# Patient Record
Sex: Male | Born: 1965 | Race: Black or African American | Hispanic: No | Marital: Married | Smoking: Never smoker
Health system: Southern US, Community
[De-identification: ages and names within clinical notes are randomized; demographics above are authoritative.]

## PROBLEM LIST (undated history)

## (undated) DIAGNOSIS — M1990 Unspecified osteoarthritis, unspecified site: Secondary | ICD-10-CM

## (undated) DIAGNOSIS — E119 Type 2 diabetes mellitus without complications: Secondary | ICD-10-CM

## (undated) DIAGNOSIS — M109 Gout, unspecified: Secondary | ICD-10-CM

## (undated) DIAGNOSIS — F1910 Other psychoactive substance abuse, uncomplicated: Secondary | ICD-10-CM

## (undated) DIAGNOSIS — I10 Essential (primary) hypertension: Secondary | ICD-10-CM

## (undated) HISTORY — DX: Type 2 diabetes mellitus without complications: E11.9

## (undated) HISTORY — DX: Other psychoactive substance abuse, uncomplicated: F19.10

---

## 2002-11-01 ENCOUNTER — Emergency Department (HOSPITAL_COMMUNITY): Admission: EM | Admit: 2002-11-01 | Discharge: 2002-11-02 | Payer: Self-pay | Admitting: Emergency Medicine

## 2004-04-05 ENCOUNTER — Ambulatory Visit (HOSPITAL_COMMUNITY): Admission: RE | Admit: 2004-04-05 | Discharge: 2004-04-05 | Payer: Self-pay | Admitting: Family Medicine

## 2004-04-24 ENCOUNTER — Ambulatory Visit (HOSPITAL_COMMUNITY): Admission: RE | Admit: 2004-04-24 | Discharge: 2004-04-24 | Payer: Self-pay | Admitting: Family Medicine

## 2004-11-30 ENCOUNTER — Emergency Department (HOSPITAL_COMMUNITY): Admission: EM | Admit: 2004-11-30 | Discharge: 2004-11-30 | Payer: Self-pay | Admitting: *Deleted

## 2005-04-15 HISTORY — PX: TOTAL HIP ARTHROPLASTY: SHX124

## 2005-05-01 ENCOUNTER — Emergency Department (HOSPITAL_COMMUNITY): Admission: EM | Admit: 2005-05-01 | Discharge: 2005-05-01 | Payer: Self-pay | Admitting: Emergency Medicine

## 2005-05-08 ENCOUNTER — Ambulatory Visit: Payer: Self-pay | Admitting: Orthopedic Surgery

## 2005-05-22 ENCOUNTER — Ambulatory Visit: Payer: Self-pay | Admitting: Orthopedic Surgery

## 2005-07-25 ENCOUNTER — Inpatient Hospital Stay (HOSPITAL_COMMUNITY): Admission: RE | Admit: 2005-07-25 | Discharge: 2005-07-29 | Payer: Self-pay | Admitting: Internal Medicine

## 2007-07-31 ENCOUNTER — Emergency Department (HOSPITAL_COMMUNITY): Admission: EM | Admit: 2007-07-31 | Discharge: 2007-07-31 | Payer: Self-pay | Admitting: Emergency Medicine

## 2010-08-31 NOTE — Op Note (Signed)
NAMEORMAN, MATSUMURA              ACCOUNT NO.:  [id number]   MEDICAL RECORD NO.:  [id number]          PATIENT TYPE:  INP   LOCATION:  1606                         FACILITY:  [facility]   PHYSICIAN:  Almedia Balls. [name], M.D. DATE OF BIRTH:  11-Dec-1965   DATE OF PROCEDURE:  07/25/2005  DATE OF DISCHARGE:                                 OPERATIVE REPORT   PREOPERATIVE DIAGNOSIS:  End-stage left hip osteoarthritis.   POSTOPERATIVE DIAGNOSIS:  End-stage left hip osteoarthritis.   PROCEDURE PERFORMED:  Left hip total hip replacement using DePuy S/ROM  prosthesis with metal-on-metal bearing surface.   SURGEON:  Almedia Balls. [name], M.D.   ASSISTANT:  Madlyn Frankel. Charlann Boxer, M.D.   SECOND ASSISTANT:  Donnie Coffin. Dixon, PA-C.   ANESTHESIA:  General.   ESTIMATED BLOOD LOSS:  500 cc.   FLUIDS REPLACED:  Crystalloid 2300 cc.   URINE OUTPUT:  300 cc.   INSTRUMENT COUNT:  Correct.   COMPLICATIONS:  None.   ANTIBIOTICS:  Perioperative antibiotics given.   INDICATIONS:  Patient is a 45 year old male with a history of worsening left  hip pain and groin and leg pain.  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  The patient now complains of mechanical symptoms, giving way,  popping and locking of his hip as well as extreme pain, which is interfering  with his lifestyle.  The patient presents now for operative hip replacement.  Informed consent was obtained.   DESCRIPTION OF THE OPERATION:  After an adequate level of anesthesia was  achieved, the patient was positioned in the right lateral decubitus position  with the left hip up.  He was stabilized using a hip positioner.  The down  leg was padded appropriately, and the axillary roll was utilized.  The  patient was secured to the table.  The right and left hip was sterilely  prepped and draped in the usual manner after identifying it correctly as the  operative leg.  Once it  was prepped and draped, we went ahead and made a  posterior Kocher Lagenbach incision, starting at the vastus ridge and  extending posteriorly towards the gluteus fibers.  Dissection was carried  sharply down through the subcutaneous tissues.  The tensor fascia lata  incised in line with its skin incision, exposing the short external  rotators.  The gluteus medius retracted.  Short external rotators were taken  off the posterior aspect of the femur with progressive internal rotation.  The piriformis was tagged.  The posterior capsule was taken down, and then  the hip was dislocated.  At this point, went ahead and used a hip trial  ball, referencing off the greater trochanter and went ahead and made our  neck cut with an oscillating saw.  When I had removed excess bone and  capsule, at this point, we retracted the hip anterior to the hip socket,  removed the labrum, and had good visualization with appropriate retractor  placement for the hip socket.  The patient had a significantly sclerotic  hip; thus, we went  ahead and medialized initially with a 45 reamer and then  went ahead and reamed sequentially up to a size 55 reamer with good coverage  posteriorly and excellent posterior column bone.  At this point, we went  ahead and impacted a 56 Pinnacle resector cup, fully seated that.  We were  happy with the anteversion and the abduction, horizontal-laid abduction, and  checked that with Charnley hip position checking device.  At this point, we  went ahead and placed two dome screws into the socket, stabilizing it, and  then placed the trial 36 degree interdiameter 0 lip liner.  At this point,  went ahead and placed our attention back towards the femur.  On the femoral  side, went ahead and sequentially reamed up to a 13.5 mm diameter for the  femur.  We were careful to stay out as lateral as possible to keep out of  varus in terms of position of the implant.  Once we had done up to a 13.5,   we were happy with our diaphyseal reaming.  We went ahead and reamed the  proximal metaphyseal portion up to a size D and then went ahead and milled  out the neck up to the large size.  Thus, this was going to be an 18/13  implant, size D large for the sleeve.  Once we did that, we went ahead and  impacted the trial sleeve in place, and then the trial stem, 18/13, 36+8  stem with a 0 degree ball and took the hip through a full range of motion.  We noted excellent stability in all ranges of motion, and the hip seemed to  be just a touch tight; thus, we went ahead and reprepared the upper  metaphyseal portion with reaming again with the metaphyseal reamer and then  again with the neck reamer.  We went ahead and replaced the sleeve,  retrialed, and then were happy with leg lengths, which seemed to be  symmetric to what we had preoperatively on the table.  Went ahead and fully  ranged the knee.  We had excellent clearance, both in extension and external  rotation and flexion and internal rotation, great stability.  Well past 90  degrees and 45-50 degrees of internal rotation and no instability.  There  was really no appreciable shuck, 1-2 mm at the most.  At this point, we  removed our trial implants, thoroughly irrigated all of the wounds, removed  the trial liner, and then went ahead and placed in the real 36 metal-on-  metal liner and impacted it in place after placing a man hole cover over the  central hole.  At this point, we went ahead and placed our attention back  towards the femoral side and then impacted the 18T large sleeve into place,  and then placed the 18/13 36+8 stem in just a touch more anteversion than we  had with the sleeve, giving [place] about 20 degrees of femoral anteversion.  At  this point, we went ahead and placed a 0 trial head ball back on and reduced the hip and noted excellent stability, excellent leg length, range of  motion, and soft tissue tension.  Thus, we removed  the trial 0 head and then  placed a real metal-on-metal 36 head in place, rereduced the hip, took it  through a full range of motion and were happy with our results.  At this  point, we went ahead and thoroughly irrigated the wound.  We checked for  any  bleeding, none was noted.  Thus, we decided not to do a drain.  We sutured  the piriformis to the underside of the gluteus medius tendon, as it was  inserting in the greater trochanter, and then went ahead and repaired the  tensor fascia lata to itself with interrupted #1 Vicryl suture followed by 2-  0 Vicryl subcutaneous and 4-0 Monocryl for skin.  Steri-Strips were applied,  followed by a sterile dressing.  The patient was taken to the recovery room,  having tolerated the surgery well.           ______________________________  Almedia Balls. [name], M.D.     SRN/MEDQ  D:  07/25/2005  T:  07/25/2005  Job:  [id number]

## 2010-08-31 NOTE — Discharge Summary (Signed)
NAMEULISSES, VONDRAK              ACCOUNT NO.:  [id number]   MEDICAL RECORD NO.:  [id number]          PATIENT TYPE:  INP   LOCATION:  1606                         FACILITY:  [facility]   PHYSICIAN:  Maisie Fus B. Dixon, P.A.  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  The patient has elected  to have a left total hip arthroplasty by Dr. Malon Kindle.   PROCEDURES:  The patient had a left total hip arthroplasty by Dr. Malon Kindle using the [facility] system.  Attending surgeon was Malon Kindle, M.D.,  first assistant was Durene Romans, M.D.  Second assistant was [employer],  PA-C.  Estimated blood loss was 500 cc.  Fluid replacement was 2300 cc and  urine output was 30 cc.  Perioperative antibiotics were given and no  complications during surgery.   HOSPITAL COURSE:  The patient was admitted on July 25, 2005, for the above  stated procedure which he tolerated well without any difficulties, and after  adequate time in the post anesthesia care unit, he was transferred up to [address]  [address].  Postop day #1, the patient complained about some moderate soreness to  the left hip but was neurovascularly intact.  Initial H&H was 14 and 33.  On  postop day #1, it only dropped down to 11 and 33 with his electrolytes  normal.  Dressing was changed.  Neurovascularly, he was intact distally.  He  proceeded with physical therapy and occupational therapy.  Continued over  the next several days with physical therapy, although, the patient was  somewhat reluctant to work with therapy.  They did continue to work with him  and recommended home bound therapy and Home Health nursing which we agree  with.  The patient to be discharged home on July 29, 2005.   DISCHARGE PLAN:  To be discharged  home with Home Health nursing and physical  therapy on July 29, 2005.   CONDITION ON DISCHARGE:  Stable.   DISCHARGE INSTRUCTIONS:  1.  Diet:  Regular.  2.  Activity:  The patient is to be toe touch weight bearing with a walker      of the left hip.   DISCHARGE MEDICATIONS:  1.  Coumadin per pharmacy protocol.  2.  Percocet 5/325 one tablet q.4-6 h p.r.n. pain.  3.  Robaxin 500 mg one tablet q.6 h p.r.n. spasm.   FOLLOWUP:  The patient will follow up backup with Dr. Malon Kindle in one  week.      Thomas B. Ferne Coe.     TBD/MEDQ  D:  07/29/2005  T:  07/29/2005  Job:  [id number]

## 2010-08-31 NOTE — H&P (Signed)
Xavier Daniels, BLITCH              ACCOUNT NO.:  [id number]   MEDICAL RECORD NO.:  [id number]           PATIENT TYPE:   LOCATION:                                 FACILITY:   PHYSICIAN:  Almedia Balls. [name], M.D. DATE OF BIRTH:  June 13, 1965   DATE OF ADMISSION:  DATE OF DISCHARGE:                                HISTORY & PHYSICAL   CHIEF COMPLAINT:  Left hip pain.   HISTORY OF PRESENT ILLNESS:  The patient is a 45 year old male who has been  complaining about left hip pain ever since a fall on May 01, 2005.  The  patient states that he sustained a fall and started having some increasing  left hip pain.  The patient was diagnosed with avascular necrosis to the  left hip and has elected to have a left total hip arthroplasty by Dr. Malon Kindle on July 25, 2005.   ALLERGIES:  No known drug allergies.   MEDICATIONS:  Consist of:  1.  Vicodin 7.5 mg one tablet p.o. q.4-6h. p.r.n. pain.  2.  Ultram 50 mg one tablet t.i.d. p.r.n. pain.   PAST MEDICAL HISTORY:  Negative.   FAMILY HISTORY:  Diabetes.   SOCIAL HISTORY:  The patient is single, does not smoke, use alcohol. He does  live in a one floor house.   REVIEW OF SYSTEMS:  Negative except for painful ambulation.   PHYSICAL EXAMINATION:  VITAL SIGNS:  Pulse 20, respirations 18, blood  pressure 132/88.  GENERAL APPEARANCE:  Healthy appearing 45 year old male in no acute  distress.  Pleasant in mood and affect, alert and oriented x3.  HEENT:  Examination of head and neck shows cranial nerves II-XII grossly  intact.  NECK:  Shows full range of motion without any tenderness.  CHEST:  Shows active breath sounds bilaterally with no wheezes, rhonchi or  rales.  HEART:  Has regular rate and rhythm with no murmurs.  ABDOMEN: Nontender, nondistended.  EXTREMITIES:  Show moderate tenderness to the left hip with any type of  internal or external rotation.  He is actually somewhat longer on the left  as compared to the right with  measurement.  SKIN:  Shows no skin rashes.   CLINICAL DATA:  X-rays show avascular necrosis of the left hip.   IMPRESSION:  Left hip avascular necrosis.   PLAN:  Plan is to have a left total hip arthroplasty by Dr. Malon Kindle on  July 25, 2005.      Thomas B. Dixon, P.A.    ______________________________  Almedia Balls. [name], M.D.    TBD/MEDQ  D:  07/10/2005  T:  07/11/2005  Job:  [id number]

## 2012-03-24 ENCOUNTER — Emergency Department (HOSPITAL_COMMUNITY)
Admission: EM | Admit: 2012-03-24 | Discharge: 2012-03-24 | Disposition: A | Discharge status: Home / Self Care | Payer: BC Managed Care – PPO | Attending: Emergency Medicine | Admitting: Emergency Medicine

## 2012-03-24 ENCOUNTER — Emergency Department (HOSPITAL_COMMUNITY): Payer: BC Managed Care – PPO

## 2012-03-24 ENCOUNTER — Encounter (HOSPITAL_COMMUNITY): Payer: Self-pay

## 2012-03-24 DIAGNOSIS — M109 Gout, unspecified: Secondary | ICD-10-CM

## 2012-03-24 DIAGNOSIS — Z79899 Other long term (current) drug therapy: Secondary | ICD-10-CM | POA: Insufficient documentation

## 2012-03-24 DIAGNOSIS — I10 Essential (primary) hypertension: Secondary | ICD-10-CM | POA: Insufficient documentation

## 2012-03-24 HISTORY — DX: Essential (primary) hypertension: I10

## 2012-03-24 LAB — CBC WITH DIFFERENTIAL/PLATELET
Basophils Absolute: 0 10*3/uL (ref 0.0–0.1)
Basophils Relative: 0 % (ref 0–1)
Eosinophils Relative: 4 % (ref 0–5)
HCT: 43 % (ref 39.0–52.0)
Hemoglobin: 14.6 g/dL (ref 13.0–17.0)
Lymphocytes Relative: 27 % (ref 12–46)
Lymphs Abs: 1.6 10*3/uL (ref 0.7–4.0)
MCH: 29.1 pg (ref 26.0–34.0)
Monocytes Absolute: 0.4 10*3/uL (ref 0.1–1.0)
Monocytes Relative: 6 % (ref 3–12)
Neutro Abs: 3.9 10*3/uL (ref 1.7–7.7)
Neutrophils Relative %: 63 % (ref 43–77)
RBC: 5.01 MIL/uL (ref 4.22–5.81)
RDW: 13.8 % (ref 11.5–15.5)
WBC: 6.1 10*3/uL (ref 4.0–10.5)

## 2012-03-24 MED ORDER — PREDNISONE 10 MG PO TABS: ORAL_TABLET | ORAL | Status: DC

## 2012-03-24 MED ORDER — OXYCODONE-ACETAMINOPHEN 5-325 MG PO TABS: 1.0000 | ORAL_TABLET | ORAL | Status: AC | PRN

## 2012-03-24 MED ORDER — PREDNISONE 50 MG PO TABS
60.0000 mg | ORAL_TABLET | Freq: Once | ORAL | Status: AC
  Administered 2012-03-24: 60 mg via ORAL
  Filled 2012-03-24: qty 1

## 2012-03-24 NOTE — ED Notes (Signed)
Pt reports right wrist pain x2 weeks. Denies nay known injury

## 2012-03-26 NOTE — ED Provider Notes (Signed)
History     CSN: [id number]  Arrival date & time 03/24/12  6578   First MD Initiated Contact with Patient 03/24/12 0914      Chief Complaint  Patient presents with  . Wrist Pain    (Consider location/radiation/quality/duration/timing/severity/associated sxs/prior treatment) HPI Comments: RHYAN RADLER presents with pain, swelling and redness at his right dorsal wrist which has been present for approximately the past 2 weeks.  He describes occasional episodes of this, but lasting a few days only then resolving; the last episode occurred approximately 8 months ago.  He denies any injury to the wrist or hand, but has an old scaphoid fracture in this hand occuring 5 years ago which he never followed through with recommended surgery.  He denies numbness in his hand.  He is right handed.  The history is provided by the patient.    Past Medical History  Diagnosis Date  . Hypertension     Past Surgical History  Procedure Date  . Total hip arthroplasty     No family history on file.  History  Substance Use Topics  . Smoking status: Never Smoker   . Smokeless tobacco: Not on file  . Alcohol Use: No      Review of Systems  Constitutional: Negative for fever and chills.  HENT: Negative for congestion, sore throat and neck pain.   Eyes: Negative.   Respiratory: Negative for chest tightness and shortness of breath.   Cardiovascular: Negative for chest pain.  Gastrointestinal: Negative for abdominal pain.  Genitourinary: Negative.   Musculoskeletal: Positive for arthralgias. Negative for joint swelling.  Skin: Positive for color change. Negative for rash and wound.  Neurological: Negative for dizziness, weakness, light-headedness, numbness and headaches.  Hematological: Negative.   Psychiatric/Behavioral: Negative.     Allergies  Review of patient's allergies indicates no known allergies.  Home Medications   Current Outpatient Rx  Name  Route  Sig  Dispense  Refill   . AMLODIPINE BESYLATE 10 MG PO TABS   Oral   Take 10 mg by mouth daily.         . ATENOLOL 50 MG PO TABS   Oral   Take 50 mg by mouth daily.         . OXYCODONE-ACETAMINOPHEN 5-325 MG PO TABS   Oral   Take 1 tablet by mouth every 4 (four) hours as needed for pain.   20 tablet   0   . PREDNISONE 10 MG PO TABS      6, 5, 4, 3, 2 then 1 tablet by mouth daily for 6 days total.   21 tablet   0     BP 127/74  Pulse 76  Temp 98.1 F (36.7 C) (Oral)  Resp 20  Ht 6\' 1"  (1.854 m)  Wt 254 lb (115.214 kg)  BMI 33.51 kg/m2  SpO2 100%  Physical Exam  Constitutional: He appears well-developed and well-nourished.  HENT:  Head: Atraumatic.  Neck: Normal range of motion.  Cardiovascular:       Pulses equal bilaterally  Musculoskeletal: He exhibits edema and tenderness.       TTP,  Edema and erythema over right wrist dorsum.  No deformity of hand or fingers appreciated,  Less than 3 sec cap refill and radial pulse full.  Skin is intact with no lesions.  No red streaking.  Neurological: He is alert. He has normal strength. He displays normal reflexes. No sensory deficit.       Equal strength  Skin: Skin is warm and dry.  Psychiatric: He has a normal mood and affect.    ED Course  Procedures (including critical care time)  Labs Reviewed  URIC ACID - Abnormal; Notable for the following:    Uric Acid, Serum 11.7 (*)     All other components within normal limits  CBC WITH DIFFERENTIAL  LAB REPORT - SCANNED   No results found.   1. Gout attack       MDM  xrays and labs reviewed.  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  Pt was prescribed prednisone taper,  Oxycodone.  Encouraged warm compresses,  Elevation,  Referral to ortho for further management of both his old injury and his new gout diagnosis. Pt understands plan.        Burgess Amor, [place] 03/26/12 1819

## 2012-03-27 NOTE — ED Provider Notes (Signed)
Medical screening examination/treatment/procedure(s) were performed by non-physician practitioner and as supervising physician I was immediately available for consultation/collaboration.  Jones Skene, M.D.     Jones Skene, MD 03/27/12 2100

## 2012-04-02 ENCOUNTER — Encounter (HOSPITAL_COMMUNITY): Payer: Self-pay

## 2012-04-02 ENCOUNTER — Emergency Department (HOSPITAL_COMMUNITY)
Admission: EM | Admit: 2012-04-02 | Discharge: 2012-04-02 | Disposition: A | Discharge status: Home / Self Care | Payer: BC Managed Care – PPO | Attending: Emergency Medicine | Admitting: Emergency Medicine

## 2012-04-02 DIAGNOSIS — I10 Essential (primary) hypertension: Secondary | ICD-10-CM | POA: Insufficient documentation

## 2012-04-02 DIAGNOSIS — M109 Gout, unspecified: Secondary | ICD-10-CM | POA: Insufficient documentation

## 2012-04-02 DIAGNOSIS — Z79899 Other long term (current) drug therapy: Secondary | ICD-10-CM | POA: Insufficient documentation

## 2012-04-02 DIAGNOSIS — M25439 Effusion, unspecified wrist: Secondary | ICD-10-CM | POA: Insufficient documentation

## 2012-04-02 DIAGNOSIS — Z96649 Presence of unspecified artificial hip joint: Secondary | ICD-10-CM | POA: Insufficient documentation

## 2012-04-02 LAB — BASIC METABOLIC PANEL
BUN: 15 mg/dL (ref 6–23)
CO2: 27 mEq/L (ref 19–32)
Calcium: 10.3 mg/dL (ref 8.4–10.5)
Chloride: 97 mEq/L (ref 96–112)
Creatinine, Ser: 1.11 mg/dL (ref 0.50–1.35)
GFR calc Af Amer: >90 mL/min (ref >90)
GFR calc non Af Amer: 78 mL/min — ABNORMAL LOW (ref >90)
Glucose, Bld: 160 mg/dL — ABNORMAL HIGH (ref 70–99)
Potassium: 3.2 mEq/L — ABNORMAL LOW (ref 3.5–5.1)
Sodium: 134 mEq/L — ABNORMAL LOW (ref 135–145)

## 2012-04-02 MED ORDER — OXYCODONE-ACETAMINOPHEN 5-325 MG PO TABS: 1.0000 | ORAL_TABLET | ORAL | Status: AC | PRN

## 2012-04-02 MED ORDER — COLCHICINE 0.6 MG PO TABS: ORAL_TABLET | ORAL | Status: DC

## 2012-04-02 MED ORDER — COLCHICINE 0.6 MG PO TABS
1.2000 mg | ORAL_TABLET | Freq: Once | ORAL | Status: AC
  Administered 2012-04-02: 1.2 mg via ORAL
  Filled 2012-04-02: qty 2

## 2012-04-02 NOTE — ED Notes (Signed)
Right wrist gout pain that started last night, has been to er for same and unable to see pmd.

## 2012-04-04 NOTE — ED Provider Notes (Signed)
History     CSN: [id number]  Arrival date & time 04/02/12  7829   First MD Initiated Contact with Patient 04/02/12 0901      Chief Complaint  Patient presents with  . Gout    (Consider location/radiation/quality/duration/timing/severity/associated sxs/prior treatment) HPI Comments: Patient with hx of gout, c/o recurrent pain to his right wrist for 1-2 days.  States he was seen here last week and treated for same.  States the wrist pain resolved while taking the prednisone previously prescribed, but pain returned after steroid was finished.  He denies numbness, weakness or proximal tenderness.  Pain to the wrist is worse with movement and improves with rest.    Patient is a 46 y.o. male presenting with wrist pain. The history is provided by the patient.  Wrist Pain This is a recurrent problem. The current episode started yesterday. The problem occurs constantly. The problem has been gradually worsening. Associated symptoms include arthralgias and joint swelling. Pertinent negatives include no chills, congestion, coughing, diaphoresis, fever, headaches, myalgias, nausea, neck pain, numbness, rash, swollen glands, vomiting or weakness. The symptoms are aggravated by bending. He has tried oral narcotics (prednisone) for the symptoms. The treatment provided significant relief.    Past Medical History  Diagnosis Date  . Hypertension     Past Surgical History  Procedure Date  . Total hip arthroplasty     No family history on file.  History  Substance Use Topics  . Smoking status: Never Smoker   . Smokeless tobacco: Not on file  . Alcohol Use: No      Review of Systems  Constitutional: Negative for fever, chills and diaphoresis.  HENT: Negative for congestion and neck pain.   Respiratory: Negative for cough.   Gastrointestinal: Negative for nausea and vomiting.  Genitourinary: Negative for dysuria and difficulty urinating.  Musculoskeletal: Positive for joint swelling and  arthralgias. Negative for myalgias and back pain.  Skin: Negative for color change, rash and wound.  Neurological: Negative for weakness, numbness and headaches.  All other systems reviewed and are negative.    Allergies  Review of patient's allergies indicates no known allergies.  Home Medications   Current Outpatient Rx  Name  Route  Sig  Dispense  Refill  . AMLODIPINE BESYLATE 10 MG PO TABS   Oral   Take 10 mg by mouth daily.         . ATENOLOL 50 MG PO TABS   Oral   Take 50 mg by mouth daily.         . COLCHICINE 0.6 MG PO TABS      One tablet po q 2 hrs until pain improved or stomach upset.   20 tablet   0   . OXYCODONE-ACETAMINOPHEN 5-325 MG PO TABS   Oral   Take 1 tablet by mouth every 4 (four) hours as needed for pain.   24 tablet   0   . PREDNISONE 10 MG PO TABS      6, 5, 4, 3, 2 then 1 tablet by mouth daily for 6 days total.   21 tablet   0     BP 127/85  Pulse 70  Temp 97.8 F (36.6 C) (Oral)  Resp 18  SpO2 100%  Physical Exam  Nursing note and vitals reviewed. Constitutional: He is oriented to person, place, and time. He appears well-developed and well-nourished. No distress.  HENT:  Head: Normocephalic and atraumatic.  Cardiovascular: Normal rate, regular rhythm, normal heart sounds and intact distal  pulses.   No murmur heard. Pulmonary/Chest: Effort normal and breath sounds normal.  Musculoskeletal: He exhibits edema and tenderness.       Dorsal right wrist is ttp .  Radial pulse is brisk, sensation intact.  CR< 2 sec.  No bruising or deformity.  Patient has full ROM.  Neurological: He is alert and oriented to person, place, and time. He exhibits normal muscle tone. Coordination normal.  Skin: Skin is warm and dry.    ED Course  Procedures (including critical care time)  Labs Reviewed  BASIC METABOLIC PANEL - Abnormal; Notable for the following:    Sodium 134 (*)     Potassium 3.2 (*)     Glucose, Bld 160 (*)     GFR calc non  Af Amer 78 (*)     All other components within normal limits  LAB REPORT - SCANNED     1. Gout flare       MDM   Previous ed chart reviewed.  ttp of the dorsal right wrist.  No erythema, previously elevated uric acid level.  Will treat with percocet and colchcine.  Pt agrees to f/u with his PMD      Ferguson Gertner L. [place], [place] 04/04/12 0021

## 2012-04-07 NOTE — ED Provider Notes (Signed)
Medical screening examination/treatment/procedure(s) were performed by non-physician practitioner and as supervising physician I was immediately available for consultation/collaboration.  Prem Coykendall, MD 04/07/12 1838 

## 2012-07-13 ENCOUNTER — Other Ambulatory Visit: Payer: Self-pay | Admitting: Orthopedic Surgery

## 2012-07-14 ENCOUNTER — Encounter (HOSPITAL_BASED_OUTPATIENT_CLINIC_OR_DEPARTMENT_OTHER): Payer: Self-pay | Admitting: *Deleted

## 2012-07-14 NOTE — Progress Notes (Signed)
Called [facility] to see if they did ekg for gout attack-no No cardiac

## 2012-07-14 NOTE — Progress Notes (Signed)
Needs istat and ekg

## 2012-07-15 ENCOUNTER — Encounter (HOSPITAL_BASED_OUTPATIENT_CLINIC_OR_DEPARTMENT_OTHER)
Admission: RE | Disposition: A | Discharge status: Home / Self Care | Payer: Self-pay | Source: Ambulatory Visit | Attending: Orthopedic Surgery

## 2012-07-15 ENCOUNTER — Ambulatory Visit (HOSPITAL_BASED_OUTPATIENT_CLINIC_OR_DEPARTMENT_OTHER): Payer: BC Managed Care – PPO | Admitting: Certified Registered Nurse Anesthetist

## 2012-07-15 ENCOUNTER — Ambulatory Visit (HOSPITAL_BASED_OUTPATIENT_CLINIC_OR_DEPARTMENT_OTHER)
Admission: RE | Admit: 2012-07-15 | Discharge: 2012-07-15 | Disposition: A | Discharge status: Home / Self Care | Payer: BC Managed Care – PPO | Source: Ambulatory Visit | Attending: Orthopedic Surgery | Admitting: Orthopedic Surgery

## 2012-07-15 ENCOUNTER — Encounter (HOSPITAL_BASED_OUTPATIENT_CLINIC_OR_DEPARTMENT_OTHER): Payer: Self-pay | Admitting: Certified Registered Nurse Anesthetist

## 2012-07-15 ENCOUNTER — Encounter (HOSPITAL_BASED_OUTPATIENT_CLINIC_OR_DEPARTMENT_OTHER): Payer: Self-pay | Admitting: *Deleted

## 2012-07-15 DIAGNOSIS — Z538 Procedure and treatment not carried out for other reasons: Secondary | ICD-10-CM | POA: Insufficient documentation

## 2012-07-15 DIAGNOSIS — I10 Essential (primary) hypertension: Secondary | ICD-10-CM | POA: Insufficient documentation

## 2012-07-15 DIAGNOSIS — IMO0002 Reserved for concepts with insufficient information to code with codable children: Secondary | ICD-10-CM | POA: Insufficient documentation

## 2012-07-15 HISTORY — DX: Gout, unspecified: M10.9

## 2012-07-15 LAB — POCT I-STAT, CHEM 8
BUN: 8 mg/dL (ref 6–23)
Calcium, Ion: 1.15 mmol/L (ref 1.12–1.23)
Chloride: 103 mEq/L (ref 96–112)
Creatinine, Ser: 1.1 mg/dL (ref 0.50–1.35)
Glucose, Bld: 133 mg/dL — ABNORMAL HIGH (ref 70–99)
HCT: 47 % (ref 39.0–52.0)
Hemoglobin: 16 g/dL (ref 13.0–17.0)
Potassium: 2.7 mEq/L — CL (ref 3.5–5.1)
Sodium: 139 mEq/L (ref 135–145)
TCO2: 25 mmol/L (ref 0–100)

## 2012-07-15 SURGERY — CANCELLED PROCEDURE
Anesthesia: General | Site: Wrist | Laterality: Right

## 2012-07-15 MED ORDER — LACTATED RINGERS IV SOLN
INTRAVENOUS | Status: DC
  Administered 2012-07-15: 14:00:00 via INTRAVENOUS

## 2012-07-15 MED ORDER — MIDAZOLAM HCL 2 MG/2ML IJ SOLN: 1.0000 mg | INTRAMUSCULAR | Status: DC | PRN

## 2012-07-15 MED ORDER — CEFAZOLIN SODIUM-DEXTROSE 2-3 GM-% IV SOLR: 2.0000 g | INTRAVENOUS | Status: DC

## 2012-07-15 MED ORDER — ACETAMINOPHEN 10 MG/ML IV SOLN
1000.0000 mg | Freq: Once | INTRAVENOUS | Status: AC
  Administered 2012-07-15: 1000 mg via INTRAVENOUS

## 2012-07-15 MED ORDER — FENTANYL CITRATE 0.05 MG/ML IJ SOLN: 50.0000 ug | INTRAMUSCULAR | Status: DC | PRN

## 2012-07-15 MED ORDER — CHLORHEXIDINE GLUCONATE 4 % EX LIQD: 60.0000 mL | Freq: Once | CUTANEOUS | Status: DC

## 2012-07-15 SURGICAL SUPPLY — 58 items
APL SKNCLS STERI-STRIP NONHPOA (GAUZE/BANDAGES/DRESSINGS)
BAG DECANTER FOR FLEXI CONT (MISCELLANEOUS) IMPLANT
BANDAGE ELASTIC 3 VELCRO ST LF (GAUZE/BANDAGES/DRESSINGS) IMPLANT
BANDAGE ELASTIC 4 VELCRO ST LF (GAUZE/BANDAGES/DRESSINGS) IMPLANT
BANDAGE GAUZE ELAST BULKY 4 IN (GAUZE/BANDAGES/DRESSINGS) IMPLANT
BENZOIN TINCTURE PRP APPL 2/3 (GAUZE/BANDAGES/DRESSINGS) IMPLANT
BLADE MINI RND TIP GREEN BEAV (BLADE) IMPLANT
BLADE SURG 15 STRL LF DISP TIS (BLADE) IMPLANT
BLADE SURG 15 STRL SS (BLADE)
BNDG CMPR 9X4 STRL LF SNTH (GAUZE/BANDAGES/DRESSINGS)
BNDG ESMARK 4X9 LF (GAUZE/BANDAGES/DRESSINGS) IMPLANT
CANISTER SUCTION 1200CC (MISCELLANEOUS) IMPLANT
CLOTH BEACON ORANGE TIMEOUT ST (SAFETY) IMPLANT
CORDS BIPOLAR (ELECTRODE) IMPLANT
COVER TABLE BACK 60X90 (DRAPES) IMPLANT
CUFF TOURNIQUET SINGLE 18IN (TOURNIQUET CUFF) IMPLANT
DECANTER SPIKE VIAL GLASS SM (MISCELLANEOUS) IMPLANT
DRAPE EXTREMITY T 121X128X90 (DRAPE) IMPLANT
DRAPE OEC MINIVIEW 54X84 (DRAPES) IMPLANT
DRAPE SURG 17X23 STRL (DRAPES) IMPLANT
DURAPREP 26ML APPLICATOR (WOUND CARE) IMPLANT
GAUZE SPONGE 4X4 16PLY XRAY LF (GAUZE/BANDAGES/DRESSINGS) IMPLANT
GAUZE XEROFORM 1X8 LF (GAUZE/BANDAGES/DRESSINGS) IMPLANT
GLOVE BIO SURGEON STRL SZ8 (GLOVE) IMPLANT
GLOVE BIOGEL PI IND STRL 8.5 (GLOVE) IMPLANT
GLOVE BIOGEL PI INDICATOR 8.5 (GLOVE)
GLOVE SURG ORTHO 8.0 STRL STRW (GLOVE) IMPLANT
GOWN PREVENTION PLUS XLARGE (GOWN DISPOSABLE) IMPLANT
GOWN PREVENTION PLUS XXLARGE (GOWN DISPOSABLE) IMPLANT
NEEDLE HYPO 25X1 1.5 SAFETY (NEEDLE) IMPLANT
NS IRRIG 1000ML POUR BTL (IV SOLUTION) IMPLANT
PACK BASIN DAY SURGERY FS (CUSTOM PROCEDURE TRAY) IMPLANT
PAD CAST 3X4 CTTN HI CHSV (CAST SUPPLIES) IMPLANT
PAD CAST 4YDX4 CTTN HI CHSV (CAST SUPPLIES) IMPLANT
PADDING CAST ABS 4INX4YD NS (CAST SUPPLIES)
PADDING CAST ABS COTTON 4X4 ST (CAST SUPPLIES) IMPLANT
PADDING CAST COTTON 3X4 STRL (CAST SUPPLIES)
PADDING CAST COTTON 4X4 STRL (CAST SUPPLIES)
SHEET MEDIUM DRAPE 40X70 STRL (DRAPES) IMPLANT
SPLINT PLASTER CAST XFAST 3X15 (CAST SUPPLIES) IMPLANT
SPLINT PLASTER CAST XFAST 4X15 (CAST SUPPLIES) IMPLANT
SPLINT PLASTER XTRA FAST SET 4 (CAST SUPPLIES)
SPLINT PLASTER XTRA FASTSET 3X (CAST SUPPLIES)
SPONGE GAUZE 4X4 12PLY (GAUZE/BANDAGES/DRESSINGS) IMPLANT
STOCKINETTE 4X48 STRL (DRAPES) IMPLANT
STRIP CLOSURE SKIN 1/2X4 (GAUZE/BANDAGES/DRESSINGS) IMPLANT
SUCTION FRAZIER TIP 10 FR DISP (SUCTIONS) IMPLANT
SUT ETHILON 4 0 PS 2 18 (SUTURE) IMPLANT
SUT MERSILENE 4 0 P 3 (SUTURE) IMPLANT
SUT PROLENE 3 0 PS 2 (SUTURE) IMPLANT
SUT SILK 2 0 FS (SUTURE) IMPLANT
SUT VIC AB 3-0 FS2 27 (SUTURE) IMPLANT
SUT VICRYL RAPIDE 4/0 PS 2 (SUTURE) IMPLANT
SYR BULB 3OZ (MISCELLANEOUS) IMPLANT
SYRINGE 10CC LL (SYRINGE) IMPLANT
TOWEL OR 17X24 6PK STRL BLUE (TOWEL DISPOSABLE) IMPLANT
TUBE CONNECTING 20X1/4 (TUBING) IMPLANT
UNDERPAD 30X30 INCONTINENT (UNDERPADS AND DIAPERS) IMPLANT

## 2012-07-15 NOTE — Progress Notes (Signed)
Dr Sampson Goon aware of Istat results.

## 2012-07-15 NOTE — Progress Notes (Signed)
Case cancelled due to labwork. Patient to see medical MD at 930 tomorrow, and lab results faxed to MD office Xavier Daniels, [place] who works with Dr Almond Lint in [place])

## 2012-07-15 NOTE — Anesthesia Preprocedure Evaluation (Deleted)
Anesthesia Evaluation  Patient identified by MRN, date of birth, ID band Patient awake    Reviewed: Allergy & Precautions, H&P , NPO status , Patient's Chart, lab work & pertinent test results, reviewed documented beta blocker date and time   Airway Mallampati: II TM Distance: >3 FB Neck ROM: full    Dental no notable dental hx. (+) Teeth Intact and Dental Advidsory Given   Pulmonary neg pulmonary ROS,  breath sounds clear to auscultation  Pulmonary exam normal       Cardiovascular hypertension, On Medications and On Home Beta Blockers Rhythm:regular Rate:Normal     Neuro/Psych negative neurological ROS  negative psych ROS   GI/Hepatic negative GI ROS, Neg liver ROS,   Endo/Other  negative endocrine ROS  Renal/GU negative Renal ROS  negative genitourinary   Musculoskeletal   Abdominal   Peds  Hematology negative hematology ROS (+)   Anesthesia Other Findings   Reproductive/Obstetrics negative OB ROS                           Anesthesia Physical Anesthesia Plan  ASA: II  Anesthesia Plan: General   Post-op Pain Management:    Induction: Intravenous  Airway Management Planned: LMA  Additional Equipment:   Intra-op Plan:   Post-operative Plan: Extubation in OR  Informed Consent: I have reviewed the patients History and Physical, chart, labs and discussed the procedure including the risks, benefits and alternatives for the proposed anesthesia with the patient or authorized representative who has indicated his/her understanding and acceptance.   Dental advisory given  Plan Discussed with: CRNA and Surgeon  Anesthesia Plan Comments: (Pt declines nerve block.)       Anesthesia Quick Evaluation

## 2013-05-17 ENCOUNTER — Encounter (HOSPITAL_COMMUNITY): Payer: Self-pay | Admitting: Emergency Medicine

## 2013-05-17 ENCOUNTER — Emergency Department (HOSPITAL_COMMUNITY)
Admission: EM | Admit: 2013-05-17 | Discharge: 2013-05-17 | Disposition: A | Discharge status: Home / Self Care | Payer: BC Managed Care – PPO | Attending: Emergency Medicine | Admitting: Emergency Medicine

## 2013-05-17 DIAGNOSIS — Y92410 Unspecified street and highway as the place of occurrence of the external cause: Secondary | ICD-10-CM | POA: Insufficient documentation

## 2013-05-17 DIAGNOSIS — Z862 Personal history of diseases of the blood and blood-forming organs and certain disorders involving the immune mechanism: Secondary | ICD-10-CM | POA: Insufficient documentation

## 2013-05-17 DIAGNOSIS — Z79899 Other long term (current) drug therapy: Secondary | ICD-10-CM | POA: Insufficient documentation

## 2013-05-17 DIAGNOSIS — I10 Essential (primary) hypertension: Secondary | ICD-10-CM | POA: Insufficient documentation

## 2013-05-17 DIAGNOSIS — Y9389 Activity, other specified: Secondary | ICD-10-CM | POA: Insufficient documentation

## 2013-05-17 DIAGNOSIS — IMO0002 Reserved for concepts with insufficient information to code with codable children: Secondary | ICD-10-CM | POA: Insufficient documentation

## 2013-05-17 DIAGNOSIS — M549 Dorsalgia, unspecified: Secondary | ICD-10-CM

## 2013-05-17 DIAGNOSIS — Z8639 Personal history of other endocrine, nutritional and metabolic disease: Secondary | ICD-10-CM | POA: Insufficient documentation

## 2013-05-17 DIAGNOSIS — X500XXA Overexertion from strenuous movement or load, initial encounter: Secondary | ICD-10-CM | POA: Insufficient documentation

## 2013-05-17 DIAGNOSIS — T148XXA Other injury of unspecified body region, initial encounter: Secondary | ICD-10-CM

## 2013-05-17 MED ORDER — METHOCARBAMOL 500 MG PO TABS: 500.0000 mg | ORAL_TABLET | Freq: Three times a day (TID) | ORAL | Status: DC

## 2013-05-17 MED ORDER — IBUPROFEN 800 MG PO TABS: ORAL_TABLET | ORAL | Status: DC
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
## 2013-05-17 NOTE — ED Notes (Signed)
Exam by PA, alert, NAD
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
## 2013-05-19 NOTE — ED Provider Notes (Signed)
Medical screening examination/treatment/procedure(s) were performed by non-physician practitioner and as supervising physician I was immediately available for consultation/collaboration.  EKG Interpretation   None         Maudry Diego, MD 05/19/13 1527

## 2013-07-29 ENCOUNTER — Emergency Department (HOSPITAL_COMMUNITY)
Admission: EM | Admit: 2013-07-29 | Discharge: 2013-07-29 | Disposition: A | Discharge status: Home / Self Care | Payer: BC Managed Care – PPO | Attending: Emergency Medicine | Admitting: Emergency Medicine

## 2013-07-29 ENCOUNTER — Encounter (HOSPITAL_COMMUNITY): Payer: Self-pay | Admitting: Emergency Medicine

## 2013-07-29 DIAGNOSIS — Z79899 Other long term (current) drug therapy: Secondary | ICD-10-CM | POA: Insufficient documentation

## 2013-07-29 DIAGNOSIS — I10 Essential (primary) hypertension: Secondary | ICD-10-CM | POA: Insufficient documentation

## 2013-07-29 DIAGNOSIS — Z8639 Personal history of other endocrine, nutritional and metabolic disease: Secondary | ICD-10-CM | POA: Insufficient documentation

## 2013-07-29 DIAGNOSIS — M549 Dorsalgia, unspecified: Secondary | ICD-10-CM | POA: Insufficient documentation

## 2013-07-29 DIAGNOSIS — Z862 Personal history of diseases of the blood and blood-forming organs and certain disorders involving the immune mechanism: Secondary | ICD-10-CM | POA: Insufficient documentation

## 2013-07-29 MED ORDER — HYDROCODONE-ACETAMINOPHEN 5-325 MG PO TABS: 1.0000 | ORAL_TABLET | Freq: Four times a day (QID) | ORAL | Status: DC | PRN

## 2013-07-29 MED ORDER — DIAZEPAM 5 MG PO TABS: 5.0000 mg | ORAL_TABLET | Freq: Two times a day (BID) | ORAL | Status: AC

## 2013-07-29 MED ORDER — IBUPROFEN 800 MG PO TABS: ORAL_TABLET | ORAL | Status: AC

## 2013-07-29 NOTE — Discharge Instructions (Signed)
As discussed, your evaluation today has been largely reassuring.  But, it is important that you monitor your condition carefully, and do not hesitate to return to the ED if you develop new, or concerning changes in your condition.  Otherwise, please follow-up with your physician and your chiropractor for appropriate ongoing care.

## 2013-07-29 NOTE — ED Notes (Signed)
C/o left sided back pain that began several days ago. Worse with movement. Ambulated to room without difficulty.

## 2013-07-29 NOTE — ED Notes (Signed)
nad noted prior to dc. Dc instructions reviewed and explained. 3 script given. Ambulated out without difficulty.
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
## 2014-03-31 ENCOUNTER — Encounter (HOSPITAL_COMMUNITY): Payer: Self-pay | Admitting: Emergency Medicine

## 2014-03-31 ENCOUNTER — Emergency Department (HOSPITAL_COMMUNITY)
Admission: EM | Admit: 2014-03-31 | Discharge: 2014-03-31 | Disposition: A | Discharge status: Home / Self Care | Payer: BC Managed Care – PPO | Attending: Emergency Medicine | Admitting: Emergency Medicine

## 2014-03-31 ENCOUNTER — Emergency Department (HOSPITAL_COMMUNITY): Payer: Self-pay

## 2014-03-31 ENCOUNTER — Emergency Department (HOSPITAL_COMMUNITY): Payer: BC Managed Care – PPO

## 2014-03-31 DIAGNOSIS — M109 Gout, unspecified: Secondary | ICD-10-CM | POA: Insufficient documentation

## 2014-03-31 DIAGNOSIS — Z79899 Other long term (current) drug therapy: Secondary | ICD-10-CM | POA: Insufficient documentation

## 2014-03-31 DIAGNOSIS — R599 Enlarged lymph nodes, unspecified: Secondary | ICD-10-CM

## 2014-03-31 DIAGNOSIS — I10 Essential (primary) hypertension: Secondary | ICD-10-CM | POA: Insufficient documentation

## 2014-03-31 DIAGNOSIS — R079 Chest pain, unspecified: Secondary | ICD-10-CM

## 2014-03-31 DIAGNOSIS — R591 Generalized enlarged lymph nodes: Secondary | ICD-10-CM

## 2014-03-31 LAB — CBC WITH DIFFERENTIAL/PLATELET
BASOS ABS: 0 10*3/uL (ref 0.0–0.1)
BASOS PCT: 1 % (ref 0–1)
Eosinophils Absolute: 0.1 10*3/uL (ref 0.0–0.7)
Eosinophils Relative: 2 % (ref 0–5)
HCT: 41.2 % (ref 39.0–52.0)
HEMOGLOBIN: 14 g/dL (ref 13.0–17.0)
Lymphocytes Relative: 34 % (ref 12–46)
Lymphs Abs: 1.9 10*3/uL (ref 0.7–4.0)
MCH: 29.3 pg (ref 26.0–34.0)
MCHC: 34 g/dL (ref 30.0–36.0)
MCV: 86.2 fL (ref 78.0–100.0)
Monocytes Absolute: 0.5 10*3/uL (ref 0.1–1.0)
Monocytes Relative: 8 % (ref 3–12)
NEUTROS ABS: 3.2 10*3/uL (ref 1.7–7.7)
NEUTROS PCT: 55 % (ref 43–77)
Platelets: 257 10*3/uL (ref 150–400)
RBC: 4.78 MIL/uL (ref 4.22–5.81)
RDW: 13.9 % (ref 11.5–15.5)
WBC: 5.7 10*3/uL (ref 4.0–10.5)

## 2014-03-31 LAB — TROPONIN I

## 2014-03-31 LAB — BASIC METABOLIC PANEL
ANION GAP: 15 (ref 5–15)
BUN: 18 mg/dL (ref 6–23)
CALCIUM: 10.3 mg/dL (ref 8.4–10.5)
CHLORIDE: 97 meq/L (ref 96–112)
CO2: 26 mEq/L (ref 19–32)
Creatinine, Ser: 1.22 mg/dL (ref 0.50–1.35)
GFR calc Af Amer: 79 mL/min — ABNORMAL LOW (ref >90)
GFR calc non Af Amer: 69 mL/min — ABNORMAL LOW (ref >90)
Glucose, Bld: 102 mg/dL — ABNORMAL HIGH (ref 70–99)
Potassium: 3.4 mEq/L — ABNORMAL LOW (ref 3.7–5.3)
SODIUM: 138 meq/L (ref 137–147)

## 2014-03-31 MED ORDER — SODIUM CHLORIDE 0.9 % IV BOLUS (SEPSIS)
500.0000 mL | Freq: Once | INTRAVENOUS | Status: AC
  Administered 2014-03-31: 500 mL via INTRAVENOUS

## 2014-03-31 MED ORDER — SODIUM CHLORIDE 0.9 % IV SOLN: INTRAVENOUS | Status: DC

## 2014-03-31 MED ORDER — IOHEXOL 300 MG/ML  SOLN
80.0000 mL | Freq: Once | INTRAMUSCULAR | Status: AC | PRN
  Administered 2014-03-31: 80 mL via INTRAVENOUS

## 2014-03-31 MED ORDER — NAPROXEN 500 MG PO TABS: 500.0000 mg | ORAL_TABLET | Freq: Two times a day (BID) | ORAL | Status: DC

## 2014-03-31 NOTE — ED Provider Notes (Signed)
CSN: [id number]     Arrival date & time 03/31/14  1507 History   First MD Initiated Contact with Patient 03/31/14 1528     Chief Complaint  Patient presents with  . Chest Pain     (Consider location/radiation/quality/duration/timing/severity/associated sxs/prior Treatment) Patient is a 48 y.o. male presenting with chest pain. The history is provided by the patient.  Chest Pain Associated symptoms: no abdominal pain, no back pain, no fever, no headache, no nausea, no palpitations, no shortness of breath and not vomiting    patient is followed by the health department for high blood pressure. Patient with 2 day history of left-sided chest pain that is intermittent and brief lasting just seconds. Not associated with shortness of breath not associated with nausea or vomiting. Patient does admit to feeling funny but is not lightheaded does not feel faint does not feel like his had a pass out does not have any dizziness. Patient has been taking his blood pressure medicine. The chest pain is left-sided sharp in nature very brief nonradiating not made better or worse by anything. Again not associated with shortness of breath.  Past Medical History  Diagnosis Date  . Hypertension   . Gout    Past Surgical History  Procedure Laterality Date  . Total hip arthroplasty  2007   Family History  Problem Relation Age of Onset  . Diabetes Mother    History  Substance Use Topics  . Smoking status: Never Smoker   . Smokeless tobacco: Never Used  . Alcohol Use: No    Review of Systems  Constitutional: Negative for fever.  HENT: Negative for congestion.   Eyes: Negative for visual disturbance.  Respiratory: Negative for shortness of breath.   Cardiovascular: Positive for chest pain. Negative for palpitations and leg swelling.  Gastrointestinal: Negative for nausea, vomiting and abdominal pain.  Genitourinary: Negative for dysuria.  Musculoskeletal: Negative for back pain.  Skin: Negative for  rash.  Neurological: Negative for headaches.  Hematological: Does not bruise/bleed easily.  Psychiatric/Behavioral: Negative for confusion.      Allergies  Review of patient's allergies indicates no known allergies.  Home Medications   Prior to Admission medications   Medication Sig Start Date End Date Taking? Authorizing Provider  amLODipine (NORVASC) 10 MG tablet Take 10 mg by mouth daily.   Yes Historical Provider, MD  atenolol-chlorthalidone (TENORETIC) 50-25 MG per tablet Take 1 tablet by mouth daily.   Yes Historical Provider, MD  indomethacin (INDOCIN) 50 MG capsule Take 50 mg by mouth daily as needed for mild pain or moderate pain.    Yes Historical Provider, MD  Multiple Vitamin (MULTIVITAMIN WITH MINERALS) TABS tablet Take 1 tablet by mouth daily.   Yes Historical Provider, MD  Potassium (POTASSIMIN PO) Take 1 tablet by mouth daily as needed (FOR LOW POTASSIUM).   Yes Historical Provider, MD  CRESTOR 5 MG tablet Take 5 mg by mouth at bedtime. 03/23/14   Historical Provider, MD  HYDROcodone-acetaminophen (NORCO/VICODIN) 5-325 MG per tablet Take 1 tablet by mouth every 6 (six) hours as needed. Patient not taking: Reported on 03/31/2014 07/29/13   Carmin Muskrat, MD  naproxen (NAPROSYN) 500 MG tablet Take 1 tablet (500 mg total) by mouth 2 (two) times daily. 03/31/14   Fredia Sorrow, MD   BP 130/87 mmHg  Pulse 81  Temp(Src) 98.8 F (37.1 C) (Oral)  Resp 20  Ht 6' (1.829 m)  Wt 243 lb (110.224 kg)  BMI 32.95 kg/m2  SpO2 99% Physical Exam  Constitutional: He is oriented to person, place, and time. He appears well-developed and well-nourished. No distress.  HENT:  Head: Normocephalic and atraumatic.  Mouth/Throat: Oropharynx is clear and moist.  Eyes: Conjunctivae and EOM are normal. Pupils are equal, round, and reactive to light.  Neck: Normal range of motion. Neck supple.  Cardiovascular: Normal rate, regular rhythm and normal heart sounds.   Pulmonary/Chest: Effort  normal and breath sounds normal. No respiratory distress.  Abdominal: Soft. Bowel sounds are normal.  Musculoskeletal: Normal range of motion. He exhibits no edema or tenderness.  Neurological: He is alert and oriented to person, place, and time. No cranial nerve deficit. He exhibits normal muscle tone. Coordination normal.  Skin: Skin is warm.  Nursing note and vitals reviewed.   ED Course  Procedures (including critical care time) Labs Review Labs Reviewed  BASIC METABOLIC PANEL - Abnormal; Notable for the following:    Potassium 3.4 (*)    Glucose, Bld 102 (*)    GFR calc non Af Amer 69 (*)    GFR calc Af Amer 79 (*)    All other components within normal limits  TROPONIN I  CBC WITH DIFFERENTIAL   Results for orders placed or performed during the hospital encounter of 03/31/14  Troponin I  Result Value Ref Range   Troponin I <0.30 <0.30 ng/mL  Basic metabolic panel  Result Value Ref Range   Sodium 138 137 - 147 mEq/L   Potassium 3.4 (L) 3.7 - 5.3 mEq/L   Chloride 97 96 - 112 mEq/L   CO2 26 19 - 32 mEq/L   Glucose, Bld 102 (H) 70 - 99 mg/dL   BUN 18 6 - 23 mg/dL   Creatinine, Ser 1.22 0.50 - 1.35 mg/dL   Calcium 10.3 8.4 - 10.5 mg/dL   GFR calc non Af Amer 69 (L) >90 mL/min   GFR calc Af Amer 79 (L) >90 mL/min   Anion gap 15 5 - 15  CBC with Differential  Result Value Ref Range   WBC 5.7 4.0 - 10.5 K/uL   RBC 4.78 4.22 - 5.81 MIL/uL   Hemoglobin 14.0 13.0 - 17.0 g/dL   HCT 41.2 39.0 - 52.0 %   MCV 86.2 78.0 - 100.0 fL   MCH 29.3 26.0 - 34.0 pg   MCHC 34.0 30.0 - 36.0 g/dL   RDW 13.9 11.5 - 15.5 %   Platelets 257 150 - 400 K/uL   Neutrophils Relative % 55 43 - 77 %   Neutro Abs 3.2 1.7 - 7.7 K/uL   Lymphocytes Relative 34 12 - 46 %   Lymphs Abs 1.9 0.7 - 4.0 K/uL   Monocytes Relative 8 3 - 12 %   Monocytes Absolute 0.5 0.1 - 1.0 K/uL   Eosinophils Relative 2 0 - 5 %   Eosinophils Absolute 0.1 0.0 - 0.7 K/uL   Basophils Relative 1 0 - 1 %   Basophils Absolute  0.0 0.0 - 0.1 K/uL     Imaging Review Dg Chest 2 View  03/31/2014   CLINICAL DATA:  Two day history of left sided chest discomfort  EXAM: CHEST  2 VIEW  COMPARISON:  None.  FINDINGS: There is no edema or consolidation. The heart size and pulmonary vascularity are normal.  There is fullness in the right inferior hilar region. No other evidence of potential adenopathy. No bone lesions. No pneumothorax.  IMPRESSION: Fullness in the right inferior hilar region. Advise contrast enhanced chest CT to assess for possible adenopathy  in this area.  No lung edema or consolidation.  No pneumothorax.   Electronically Signed   By: Lowella Grip M.D.   On: 03/31/2014 16:46   Ct Chest W Contrast  03/31/2014   CLINICAL DATA:  Abnormal chest x-ray with right hilar fullness  EXAM: CT CHEST WITH CONTRAST  TECHNIQUE: Multidetector CT imaging of the chest was performed during intravenous contrast administration.  CONTRAST:  81mL OMNIPAQUE IOHEXOL 300 MG/ML  SOLN  COMPARISON:  03/31/2014  FINDINGS: The lungs are well aerated bilaterally without evidence of focal infiltrate or sizable effusion. No parenchymal nodules are seen. The thoracic inlet is within normal limits. The thoracic aorta and its branches are unremarkable. There are changes consistent with right paratracheal/hilar adenopathy which measures 2.7 x 1.4 cm in toto. A smaller node is noted measuring 12.5 mm in short axis. Additionally some smaller nodes are seen in the right infrahilar region. Similar nodal changes are noted on the left but to a slightly lesser degree. No significant mediastinal adenopathy is noted.  Scanning into the upper abdomen reveals no acute abnormality. The osseous structures are within normal limits.  IMPRESSION: No evidence of focal infiltrate. Hilar adenopathy likely related to sarcoidosis. Clinical correlation is recommended and further evaluation performed as necessary   Electronically Signed   By: Inez Catalina M.D.   On: 03/31/2014  18:42     EKG Interpretation   Date/Time:  Thursday March 31 2014 15:29:00 EST Ventricular Rate:  83 PR Interval:  157 QRS Duration: 113 QT Interval:  380 QTC Calculation: 446 R Axis:   11 Text Interpretation:  Sinus rhythm Borderline intraventricular conduction  delay RSR' in V1 or V2, probably normal variant No significant change  since last tracing Confirmed by Semir Brill  MD, Jaxston Chohan [phone]) on 03/31/2014  3:46:37 PM      MDM   Final diagnoses:  Chest pain  Adenopathy    Workup for the chest pain without any significant findings. Troponin was negative EKG without acute changes. Chest x-ray negative for pneumonia pneumothorax. Patient's had the chest pain for 2 days. Been intermittent never lasting more than a few minutes. Certainly not lasting 15 minutes. Usually lasting seconds. CT scan of the chest was done for the adenopathy. That seems to be consistent with sarcoidosis. Patient will follow-up with the health department as he is currently followed there for his high blood pressure. Patient nontoxic no acute distress. Clinically not worried about a pulmonary embolus.    Fredia Sorrow, MD 03/31/14 [phone]

## 2014-03-31 NOTE — ED Notes (Addendum)
Patient reports "just feeling funny last couple of days." Per patient feels fine when sitting but if he gets up to do anything he has a "funny feeling." Patient unable to describe. Patient does report intermittent left chest. Denies any shortness of breath, dizziness, nausea, or vomiting. Patient states "At first I thought it was blood pressure but every time I checked it it was fine. Then I thought it was my potassium, so I started taking potassium pills and eating bananas and raisins."

## 2014-03-31 NOTE — Discharge Instructions (Signed)
Workup for the chest pain without evidence of any acute cardiac event. Also no evidence of pneumonia. Does show some enlarged lymph nodes in your chest this could be consistent with sarcoidosis. Follow-up with the health department. Return for any new or worse symptoms. Take the Naprosyn as directed for the next 7 days.

## 2014-04-28 ENCOUNTER — Encounter (HOSPITAL_BASED_OUTPATIENT_CLINIC_OR_DEPARTMENT_OTHER): Payer: Self-pay | Admitting: Orthopedic Surgery

## 2014-05-19 ENCOUNTER — Encounter: Payer: Self-pay | Admitting: *Deleted

## 2014-05-20 ENCOUNTER — Emergency Department (HOSPITAL_COMMUNITY): Payer: Self-pay

## 2014-05-20 ENCOUNTER — Emergency Department (HOSPITAL_COMMUNITY)
Admission: EM | Admit: 2014-05-20 | Discharge: 2014-05-20 | Disposition: A | Discharge status: Home / Self Care | Payer: Self-pay | Attending: Emergency Medicine | Admitting: Emergency Medicine

## 2014-05-20 ENCOUNTER — Encounter (HOSPITAL_COMMUNITY): Payer: Self-pay | Admitting: Emergency Medicine

## 2014-05-20 DIAGNOSIS — M109 Gout, unspecified: Secondary | ICD-10-CM | POA: Insufficient documentation

## 2014-05-20 DIAGNOSIS — I10 Essential (primary) hypertension: Secondary | ICD-10-CM | POA: Insufficient documentation

## 2014-05-20 DIAGNOSIS — M25431 Effusion, right wrist: Secondary | ICD-10-CM | POA: Insufficient documentation

## 2014-05-20 DIAGNOSIS — R079 Chest pain, unspecified: Secondary | ICD-10-CM | POA: Insufficient documentation

## 2014-05-20 DIAGNOSIS — Z79899 Other long term (current) drug therapy: Secondary | ICD-10-CM | POA: Insufficient documentation

## 2014-05-20 DIAGNOSIS — M25531 Pain in right wrist: Secondary | ICD-10-CM | POA: Insufficient documentation

## 2014-05-20 LAB — CBC WITH DIFFERENTIAL/PLATELET
Basophils Absolute: 0 10*3/uL (ref 0.0–0.1)
Basophils Relative: 0 % (ref 0–1)
Eosinophils Absolute: 0 10*3/uL (ref 0.0–0.7)
Eosinophils Relative: 0 % (ref 0–5)
HCT: 42.2 % (ref 39.0–52.0)
Hemoglobin: 14.1 g/dL (ref 13.0–17.0)
Lymphocytes Relative: 19 % (ref 12–46)
Lymphs Abs: 1.7 10*3/uL (ref 0.7–4.0)
MCH: 28.7 pg (ref 26.0–34.0)
MCHC: 33.4 g/dL (ref 30.0–36.0)
MCV: 85.9 fL (ref 78.0–100.0)
Monocytes Absolute: 0.5 10*3/uL (ref 0.1–1.0)
Monocytes Relative: 5 % (ref 3–12)
Neutro Abs: 7 10*3/uL (ref 1.7–7.7)
Neutrophils Relative %: 76 % (ref 43–77)
Platelets: 313 10*3/uL (ref 150–400)
RBC: 4.91 MIL/uL (ref 4.22–5.81)
RDW: 13.3 % (ref 11.5–15.5)
WBC: 9.3 10*3/uL (ref 4.0–10.5)

## 2014-05-20 LAB — BASIC METABOLIC PANEL
Anion gap: 11 (ref 5–15)
BUN: 11 mg/dL (ref 6–23)
CO2: 26 mmol/L (ref 19–32)
Calcium: 10.2 mg/dL (ref 8.4–10.5)
Chloride: 103 mmol/L (ref 96–112)
Creatinine, Ser: 1.14 mg/dL (ref 0.50–1.35)
GFR calc Af Amer: 86 mL/min — ABNORMAL LOW (ref >90)
GFR calc non Af Amer: 74 mL/min — ABNORMAL LOW (ref >90)
Glucose, Bld: 109 mg/dL — ABNORMAL HIGH (ref 70–99)
Potassium: 3.6 mmol/L (ref 3.5–5.1)
Sodium: 140 mmol/L (ref 135–145)

## 2014-05-20 LAB — TROPONIN I: Troponin I: <0.03 ng/mL (ref <0.031)

## 2014-05-20 MED ORDER — IBUPROFEN 400 MG PO TABS
600.0000 mg | ORAL_TABLET | Freq: Once | ORAL | Status: AC
  Administered 2014-05-20: 600 mg via ORAL
  Filled 2014-05-20: qty 2

## 2014-05-20 MED ORDER — IBUPROFEN 600 MG PO TABS: 600.0000 mg | ORAL_TABLET | Freq: Four times a day (QID) | ORAL | Status: DC | PRN

## 2014-05-20 MED ORDER — COLCHICINE 0.6 MG PO TABS
1.2000 mg | ORAL_TABLET | Freq: Once | ORAL | Status: AC
  Administered 2014-05-20: 1.2 mg via ORAL
  Filled 2014-05-20: qty 2

## 2014-05-20 MED ORDER — COLCHICINE 0.6 MG PO TABS: 0.6000 mg | ORAL_TABLET | Freq: Two times a day (BID) | ORAL | Status: DC | PRN

## 2014-05-20 MED ORDER — OXYCODONE-ACETAMINOPHEN 5-325 MG PO TABS: 1.0000 | ORAL_TABLET | ORAL | Status: DC | PRN

## 2014-05-20 NOTE — ED Notes (Signed)
Pt alert & oriented x4, stable gait. Patient given discharge instructions, paperwork & prescription(s). Patient  instructed to stop at the registration desk to finish any additional paperwork. Patient verbalized understanding. Pt left department w/ no further questions. 
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
## 2014-05-20 NOTE — Discharge Instructions (Signed)

## 2014-06-06 ENCOUNTER — Ambulatory Visit (INDEPENDENT_AMBULATORY_CARE_PROVIDER_SITE_OTHER): Payer: 59 | Admitting: Family Medicine

## 2014-06-06 ENCOUNTER — Encounter: Payer: Self-pay | Admitting: Family Medicine

## 2014-06-06 VITALS — BP 132/76 | HR 76 | Temp 98.5°F | Resp 18 | Ht 73.0 in | Wt 238.0 lb

## 2014-06-06 DIAGNOSIS — M109 Gout, unspecified: Secondary | ICD-10-CM | POA: Insufficient documentation

## 2014-06-06 DIAGNOSIS — I10 Essential (primary) hypertension: Secondary | ICD-10-CM

## 2014-06-06 DIAGNOSIS — E785 Hyperlipidemia, unspecified: Secondary | ICD-10-CM

## 2014-06-06 DIAGNOSIS — E669 Obesity, unspecified: Secondary | ICD-10-CM

## 2014-06-06 DIAGNOSIS — M1A09X0 Idiopathic chronic gout, multiple sites, without tophus (tophi): Secondary | ICD-10-CM

## 2014-06-06 MED ORDER — IBUPROFEN 600 MG PO TABS: 600.0000 mg | ORAL_TABLET | Freq: Four times a day (QID) | ORAL | Status: DC | PRN

## 2014-06-06 MED ORDER — FEBUXOSTAT 40 MG PO TABS: 40.0000 mg | ORAL_TABLET | Freq: Every day | ORAL | Status: DC

## 2014-06-06 MED ORDER — ATENOLOL 50 MG PO TABS: 50.0000 mg | ORAL_TABLET | Freq: Every day | ORAL | Status: DC

## 2014-06-06 NOTE — Assessment & Plan Note (Signed)
He is not taking the Crestor that he was prescribed. I will check his lipid panel and see if he still needs anything for his cholesterol

## 2014-06-06 NOTE — Assessment & Plan Note (Signed)
Significant history of gout. Will recheck a uric acid. He also has a family history of gout. Since others in the family have done well with the Uloric will give him a sample of the 40 mg once a day

## 2014-06-06 NOTE — Assessment & Plan Note (Signed)
His goal is another 10 pounds off. This will get him out of the obesity category and help significantly decrease his risk for coronary artery disease

## 2014-06-06 NOTE — Assessment & Plan Note (Signed)
Blood pressure is well-controlled today however I'm concerned about him being on hydrochlorothiazide in the setting of his severe gout. I'm going to discontinue this I will check his blood work. He will follow-up in 2 weeks and we'll see how he is doing on just the atenolol and the amlodipine

## 2014-06-06 NOTE — Patient Instructions (Addendum)
Stop the HCTZ Continue atenolol and norvasc We will call with lab results  Try Uloric take 1 tablet daily  Prescription card given [place]- [facility] DEPT F/U in 2 weeks for blood pressure

## 2014-06-06 NOTE — Addendum Note (Signed)
Addended by: Sheral Flow on: 06/06/2014 02:57 PM   Modules accepted: Orders

## 2014-06-06 NOTE — Progress Notes (Signed)
Patient ID: Xavier Daniels, male   DOB: [birth year], 49 y.o.   MRN: [id number]   Subjective:    Patient ID: Xavier Daniels, male    DOB: 12-Jun-1965, 49 y.o.   MRN: [id number]  Patient presents for New Patient- Establish Care and Gout  patient here to establish care. His previous primary care providers the health department in [facility]. He states that he recently received insurance. He has history of hypertension which is being controlled on hydrochlorothiazide, amlodipine and atenolol. He also has hyperlipidemia however he has lost 40 pounds intentionally he was given Crestor but he never took this due to change in his diet. He also suffers with severe gout. His uric acid level was elevated at 11.7 in 2013. He has bottles of indomethacin as well as cultures seen which he keeps on hand for flares. He was also given prednisone and Percocet by the ER last year because of a severe flare in his wrists. He was never put on any maintenance medications per report. He asked about Urloric today he has family members that are on this that help control their gout.  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  Declines flu shot TDAP UTD  Review Of Systems:  GEN- denies fatigue, fever, weight loss,weakness, recent illness HEENT- denies eye drainage, change in vision, nasal discharge, CVS- denies chest pain, palpitations RESP- denies SOB, cough, wheeze ABD- denies N/V, change in stools, abd pain GU- denies dysuria, hematuria, dribbling, incontinence MSK- + joint pain, muscle aches, injury Neuro- denies headache, dizziness, syncope, seizure activity       Objective:    BP 132/76 mmHg  Pulse 76  Temp(Src) 98.5 F (36.9 C) (Oral)  Resp 18  Ht 6\' 1"  (1.854 m)  Wt 238 lb (107.956 kg)  BMI 31.41 kg/m2 GEN- NAD, alert and oriented x3 HEENT- PERRL,  EOMI, non injected sclera, pink conjunctiva, MMM, oropharynx clear Neck- Supple,  CVS- RRR, no murmur RESP-CTAB ABD-NABS,soft,NT,ND Psych- normal affect and mood EXT- No edema Pulses- Radial 2+        Assessment & Plan:      Problem List Items Addressed This Visit      Unprioritized   Hyperlipidemia   Relevant Medications   atenolol (TENORMIN) tablet   Other Relevant Orders   Lipid panel   Gout   Relevant Orders   Uric Acid   Essential hypertension - Primary   Relevant Medications   atenolol (TENORMIN) tablet   Other Relevant Orders   CBC with Differential/Platelet   Comprehensive metabolic panel      Note: This dictation was prepared with Dragon dictation along with smaller phrase technology. Any transcriptional errors that result from this process are unintentional.

## 2014-06-07 LAB — LIPID PANEL
CHOLESTEROL: 228 mg/dL — AB (ref 0–200)
HDL: 42 mg/dL (ref >=40)
LDL Cholesterol: 158 mg/dL — ABNORMAL HIGH (ref 0–99)
TRIGLYCERIDES: 142 mg/dL (ref <150)
Total CHOL/HDL Ratio: 5.4 Ratio
VLDL: 28 mg/dL (ref 0–40)

## 2014-06-07 LAB — COMPREHENSIVE METABOLIC PANEL
ALBUMIN: 4.7 g/dL (ref 3.5–5.2)
ALT: 17 U/L (ref 0–53)
AST: 14 U/L (ref 0–37)
Alkaline Phosphatase: 85 U/L (ref 39–117)
BUN: 15 mg/dL (ref 6–23)
CHLORIDE: 100 meq/L (ref 96–112)
CO2: 27 mEq/L (ref 19–32)
Calcium: 10.4 mg/dL (ref 8.4–10.5)
Creat: 1.23 mg/dL (ref 0.50–1.35)
Glucose, Bld: 105 mg/dL — ABNORMAL HIGH (ref 70–99)
Potassium: 4.3 mEq/L (ref 3.5–5.3)
SODIUM: 138 meq/L (ref 135–145)
TOTAL PROTEIN: 8.4 g/dL — AB (ref 6.0–8.3)
Total Bilirubin: 0.6 mg/dL (ref 0.2–1.2)

## 2014-06-07 LAB — CBC WITH DIFFERENTIAL/PLATELET
Basophils Absolute: 0 10*3/uL (ref 0.0–0.1)
Basophils Relative: 0 % (ref 0–1)
EOS PCT: 2 % (ref 0–5)
Eosinophils Absolute: 0.1 10*3/uL (ref 0.0–0.7)
HCT: 43.8 % (ref 39.0–52.0)
HEMOGLOBIN: 14.3 g/dL (ref 13.0–17.0)
Lymphocytes Relative: 41 % (ref 12–46)
Lymphs Abs: 2.2 10*3/uL (ref 0.7–4.0)
MCH: 28.6 pg (ref 26.0–34.0)
MCHC: 32.6 g/dL (ref 30.0–36.0)
MCV: 87.6 fL (ref 78.0–100.0)
MONO ABS: 0.4 10*3/uL (ref 0.1–1.0)
MPV: 10.4 fL (ref 8.6–12.4)
Monocytes Relative: 8 % (ref 3–12)
Neutro Abs: 2.6 10*3/uL (ref 1.7–7.7)
Neutrophils Relative %: 49 % (ref 43–77)
Platelets: 295 10*3/uL (ref 150–400)
RBC: 5 MIL/uL (ref 4.22–5.81)
RDW: 14.7 % (ref 11.5–15.5)
WBC: 5.3 10*3/uL (ref 4.0–10.5)

## 2014-06-07 LAB — URIC ACID: Uric Acid, Serum: 10.6 mg/dL — ABNORMAL HIGH (ref 4.0–7.8)

## 2014-06-09 ENCOUNTER — Telehealth: Payer: Self-pay | Admitting: Family Medicine

## 2014-06-09 NOTE — Telephone Encounter (Signed)
Call placed to patient.   States that he is currently taking the Uloric 40mg  fpr gout pain in wrist, and he has Percocet for breakthrough pain.   Reports that pain is not improved and he would like something stronger than Uloric. Patient states that he has not been taking the col crys at this time.   MD please advise.

## 2014-06-09 NOTE — Telephone Encounter (Signed)
Patient says that dr Buelah Manis had discussed with him if he needed a stronger gout med let her know, well he needs something stronger  Please call him at [phone] [facility] [place]

## 2014-06-09 NOTE — Telephone Encounter (Signed)
Continue the uloric, take colchicine 1 tablet twice a day for 7 days, for currently flare

## 2014-06-09 NOTE — Telephone Encounter (Signed)
Call placed to patient and patient made aware.  

## 2014-06-20 ENCOUNTER — Ambulatory Visit (INDEPENDENT_AMBULATORY_CARE_PROVIDER_SITE_OTHER): Payer: 59 | Admitting: Family Medicine

## 2014-06-20 ENCOUNTER — Encounter: Payer: Self-pay | Admitting: Family Medicine

## 2014-06-20 VITALS — BP 138/74 | HR 76 | Temp 98.4°F | Resp 18 | Ht 73.0 in | Wt 225.0 lb

## 2014-06-20 DIAGNOSIS — M1000 Idiopathic gout, unspecified site: Secondary | ICD-10-CM

## 2014-06-20 DIAGNOSIS — E785 Hyperlipidemia, unspecified: Secondary | ICD-10-CM

## 2014-06-20 DIAGNOSIS — I10 Essential (primary) hypertension: Secondary | ICD-10-CM

## 2014-06-20 DIAGNOSIS — M109 Gout, unspecified: Secondary | ICD-10-CM | POA: Insufficient documentation

## 2014-06-20 MED ORDER — OXYCODONE-ACETAMINOPHEN 5-325 MG PO TABS: 1.0000 | ORAL_TABLET | ORAL | Status: DC | PRN

## 2014-06-20 MED ORDER — PREDNISONE 20 MG PO TABS: ORAL_TABLET | ORAL | Status: DC

## 2014-06-20 MED ORDER — OXYCODONE-ACETAMINOPHEN 5-325 MG PO TABS: 1.0000 | ORAL_TABLET | Freq: Four times a day (QID) | ORAL | Status: DC | PRN

## 2014-06-20 MED ORDER — FEBUXOSTAT 40 MG PO TABS: 40.0000 mg | ORAL_TABLET | Freq: Every day | ORAL | Status: DC

## 2014-06-20 NOTE — Progress Notes (Signed)
Patient ID: Xavier Daniels, male   DOB: [birth year], 49 y.o.   MRN: [id number]   Subjective:    Patient ID: Xavier Daniels, male    DOB: [birth year], 49 y.o.   MRN: [id number]  Patient presents for R Wrist Swelling  Pt here with gout attack in right wrist, he recently established care, I started on Uloric and discontinued HCTZ, he took a few doses of colcrys and pain and swelling improved but he stopped concerned about the diarrhea accompanying the use and then soaked in hot water and epson salt and made swelling worse.  Last week he also had job interview with [employer] and his wrist was swollen he was advised he needed documentation from myself to explain his gout findings  Hyperlipidemia- noted on last set of notes, he cut out a lot of snacks/junk but is only eating 1 meal a day, and has dropped down 12 pounds.    Review Of Systems:  GEN- denies fatigue, fever, weight loss,weakness, recent illness HEENT- denies eye drainage, change in vision, nasal discharge, CVS- denies chest pain, palpitations RESP- denies SOB, cough, wheeze MSK- +joint pain, muscle aches, injury Neuro- denies headache, dizziness, syncope, seizure activity       Objective:    BP 138/74 mmHg  Pulse 76  Temp(Src) 98.4 F (36.9 C) (Oral)  Resp 18  Ht 6\' 1"  (1.854 m)  Wt 225 lb (102.059 kg)  BMI 29.69 kg/m2 GEN- NAD, alert and oriented x3 CVS- RRR, no murmur RESP-CTAB MSK- Right wrist, + swelling , Decreased ROM, mild warmth , no eythema Pulse- Radial 2+        Assessment & Plan:      Problem List Items Addressed This Visit      Unprioritized   Hyperlipidemia   Gout attack - Primary   Essential hypertension      Note: This dictation was prepared with Dragon dictation along with smaller phrase technology. Any transcriptional errors that result from this process are unintentional.

## 2014-06-20 NOTE — Assessment & Plan Note (Signed)
Dietary changes discussed I also discussed with him that he is not eating enough calories , eating at least 3 meals a day or 5 small meals. He was also given handouts on cholesterol and healthy eating

## 2014-06-20 NOTE — Assessment & Plan Note (Signed)
Blood pressure is well controlled off of the hydrochlorothiazide

## 2014-06-20 NOTE — Patient Instructions (Addendum)
Note for job Percocet for pain Prednisone for the gout flare Cholesterol handouts for your diet F/U 3 months

## 2014-06-20 NOTE — Assessment & Plan Note (Signed)
Due to cost, treat with prednisone taper will have also given him a prescription for Percocet short-term. He will continue Uloric he did have quite elevated uric acid levels

## 2014-06-21 ENCOUNTER — Telehealth: Payer: Self-pay | Admitting: *Deleted

## 2014-06-21 NOTE — Telephone Encounter (Signed)
Uloric cash price is >$300 per month. Insurance paid for $111, and the co-pay card paid for $130. This left patient with co-pay of $86.  MD please advise.

## 2014-06-21 NOTE — Telephone Encounter (Signed)
Send allopurinol 100mg  once a day , this should be covered, D/C uloric due to cost

## 2014-06-21 NOTE — Telephone Encounter (Signed)
Received request from pharmacy for [place] on Uloric.   PA submitted.   Dx: M10.00

## 2014-06-21 NOTE — Telephone Encounter (Signed)
Received PA determination.   PA approved.  

## 2014-06-21 NOTE — Telephone Encounter (Signed)
Pt called he just went to his pharmacy to get uloric and they told him that it would cost him 80.00 with insurance card and 15 dollar copay, pt wants to know if he could get more samples and to see if you could find out why it is costing him that much. If that much then he would like to get something cheaper. Please advise!

## 2014-06-22 MED ORDER — ALLOPURINOL 100 MG PO TABS: 100.0000 mg | ORAL_TABLET | Freq: Every day | ORAL | Status: DC

## 2014-06-22 NOTE — Telephone Encounter (Signed)
Call placed to patient and patient made aware.   Prescription sent to pharmacy.  

## 2014-08-17 ENCOUNTER — Telehealth: Payer: Self-pay | Admitting: Family Medicine

## 2014-08-17 MED ORDER — IBUPROFEN 600 MG PO TABS: 600.0000 mg | ORAL_TABLET | Freq: Four times a day (QID) | ORAL | Status: DC | PRN

## 2014-08-17 NOTE — Telephone Encounter (Signed)
Refill appropriate and filled per protocol. 

## 2014-08-17 NOTE — Telephone Encounter (Signed)
[facility]  PT is needing a refill on ibuprofen (ADVIL,MOTRIN) 600 MG tablet

## 2014-08-22 ENCOUNTER — Telehealth: Payer: Self-pay | Admitting: *Deleted

## 2014-08-22 MED ORDER — FLUTICASONE PROPIONATE 50 MCG/ACT NA SUSP: 2.0000 | Freq: Every day | NASAL | Status: DC

## 2014-08-22 NOTE — Telephone Encounter (Signed)
He can take claritin or zyrtec or use flonase

## 2014-08-22 NOTE — Telephone Encounter (Signed)
Received call from patient.   Reports that he is having increased nasal congestion and cough D/T seasonal allergies.   Requested MD to advise the best allergy medication for him that will not interact with HTN medications.

## 2014-08-22 NOTE — Telephone Encounter (Signed)
Call placed to patient and patient made aware.   Requested Flonase to be sent to pharmacy.   Prescription sent to pharmacy.

## 2014-09-23 ENCOUNTER — Ambulatory Visit (INDEPENDENT_AMBULATORY_CARE_PROVIDER_SITE_OTHER): Payer: 59 | Admitting: Family Medicine

## 2014-09-23 ENCOUNTER — Encounter: Payer: Self-pay | Admitting: Family Medicine

## 2014-09-23 VITALS — BP 126/74 | HR 72 | Temp 98.2°F | Resp 14 | Ht 73.0 in | Wt 222.0 lb

## 2014-09-23 DIAGNOSIS — M1A09X0 Idiopathic chronic gout, multiple sites, without tophus (tophi): Secondary | ICD-10-CM

## 2014-09-23 DIAGNOSIS — E785 Hyperlipidemia, unspecified: Secondary | ICD-10-CM

## 2014-09-23 DIAGNOSIS — E663 Overweight: Secondary | ICD-10-CM

## 2014-09-23 DIAGNOSIS — I10 Essential (primary) hypertension: Secondary | ICD-10-CM | POA: Diagnosis not present

## 2014-09-23 MED ORDER — AMLODIPINE BESYLATE 10 MG PO TABS: 5.0000 mg | ORAL_TABLET | Freq: Every day | ORAL | Status: DC

## 2014-09-23 MED ORDER — ALLOPURINOL 100 MG PO TABS: 100.0000 mg | ORAL_TABLET | Freq: Every day | ORAL | Status: DC

## 2014-09-23 MED ORDER — ATENOLOL 50 MG PO TABS: 50.0000 mg | ORAL_TABLET | Freq: Every day | ORAL | Status: DC

## 2014-09-23 NOTE — Assessment & Plan Note (Signed)
With his current weight loss I'm going to decrease his amlodipine to 5 mg daily he will continue the atenolol we may be able to take  amlodipine completely off by the next visit

## 2014-09-23 NOTE — Patient Instructions (Addendum)
Decrease Norvasc take only 1/2 tablet once a day  Get the labs done fasting F/U 3 months

## 2014-09-23 NOTE — Progress Notes (Signed)
Patient ID: Xavier Daniels, male   DOB: [birth year], 49 y.o.   MRN: [id number]   Subjective:    Patient ID: Xavier Daniels, male    DOB: 1965-06-25, 49 y.o.   MRN: [id number]  Patient presents for 3 month F/U  patient here to follow-up. He is intentionally lost 16 pounds since her last visit trying to decrease his cholesterol and he would like to come off of some of his medications. He eats mostly grilled and steamed that she's. He drinks significant amounts of water he is avoiding artificial sugars. He also walks daily and is active with his children. He has not had any difficulties with his gout the past couple months. He is taking allopurinol as prescribed.    Review Of Systems:  GEN- denies fatigue, fever, weight loss,weakness, recent illness HEENT- denies eye drainage, change in vision, nasal discharge, CVS- denies chest pain, palpitations RESP- denies SOB, cough, wheeze ABD- denies N/V, change in stools, abd pain GU- denies dysuria, hematuria, dribbling, incontinence MSK- denies joint pain, muscle aches, injury Neuro- denies headache, dizziness, syncope, seizure activity       Objective:    BP 126/74 mmHg  Pulse 72  Temp(Src) 98.2 F (36.8 C) (Oral)  Resp 14  Ht 6\' 1"  (1.854 m)  Wt 222 lb (100.699 kg)  BMI 29.30 kg/m2 GEN- NAD, alert and oriented x3 HEENT- PERRL, EOMI, non injected sclera, pink conjunctiva, MMM, oropharynx clear CVS- RRR, no murmur RESP-CTAB EXT- No edema Pulses- Radial 2+        Assessment & Plan:      Problem List Items Addressed This Visit    Obesity - Primary   Hyperlipidemia   Relevant Orders   Comprehensive metabolic panel   Lipid panel   Gout   Relevant Orders   Uric acid   Essential hypertension   Relevant Orders   CBC with Differential/Platelet      Note: This dictation was prepared with Dragon dictation along with smaller phrase technology. Any transcriptional errors that result from this process are unintentional.

## 2014-09-23 NOTE — Assessment & Plan Note (Signed)
We'll plan to recheck his fasting lipids. Hopefully his cholesterol has improved with his weight loss and diet changes

## 2014-09-23 NOTE — Assessment & Plan Note (Signed)
Significant weight loss decreasing his overall cardiovascular risk factors.

## 2014-09-23 NOTE — Assessment & Plan Note (Signed)
Recheck his uric acid to ensure that the allopurinol is keeping his levels low. He has had decrease in the amount of gouty attacks

## 2014-09-24 LAB — CBC WITH DIFFERENTIAL/PLATELET
Basophils Absolute: 0 K/uL (ref 0.0–0.1)
Basophils Relative: 0 % (ref 0–1)
Eosinophils Absolute: 0.1 K/uL (ref 0.0–0.7)
Eosinophils Relative: 1 % (ref 0–5)
HCT: 41.2 % (ref 39.0–52.0)
Hemoglobin: 13.4 g/dL (ref 13.0–17.0)
Lymphocytes Relative: 35 % (ref 12–46)
Lymphs Abs: 1.8 K/uL (ref 0.7–4.0)
MCH: 28.5 pg (ref 26.0–34.0)
MCHC: 32.5 g/dL (ref 30.0–36.0)
MCV: 87.5 fL (ref 78.0–100.0)
MPV: 10.4 fL (ref 8.6–12.4)
Monocytes Absolute: 0.4 K/uL (ref 0.1–1.0)
Monocytes Relative: 8 % (ref 3–12)
Neutro Abs: 2.9 K/uL (ref 1.7–7.7)
Neutrophils Relative %: 56 % (ref 43–77)
Platelets: 237 K/uL (ref 150–400)
RBC: 4.71 MIL/uL (ref 4.22–5.81)
RDW: 15.2 % (ref 11.5–15.5)
WBC: 5.1 K/uL (ref 4.0–10.5)

## 2014-09-24 LAB — LIPID PANEL
Cholesterol: 126 mg/dL (ref 0–200)
HDL: 37 mg/dL — ABNORMAL LOW
LDL Cholesterol: 71 mg/dL (ref 0–99)
Total CHOL/HDL Ratio: 3.4 ratio
Triglycerides: 90 mg/dL
VLDL: 18 mg/dL (ref 0–40)

## 2014-09-24 LAB — COMPREHENSIVE METABOLIC PANEL WITH GFR
ALT: 16 U/L (ref 0–53)
AST: 12 U/L (ref 0–37)
Albumin: 4.2 g/dL (ref 3.5–5.2)
Alkaline Phosphatase: 74 U/L (ref 39–117)
BUN: 14 mg/dL (ref 6–23)
CO2: 24 meq/L (ref 19–32)
Calcium: 9.5 mg/dL (ref 8.4–10.5)
Chloride: 106 meq/L (ref 96–112)
Creat: 1.21 mg/dL (ref 0.50–1.35)
Glucose, Bld: 96 mg/dL (ref 70–99)
Potassium: 4 meq/L (ref 3.5–5.3)
Sodium: 140 meq/L (ref 135–145)
Total Bilirubin: 0.4 mg/dL (ref 0.2–1.2)
Total Protein: 7.2 g/dL (ref 6.0–8.3)

## 2014-09-24 LAB — URIC ACID: Uric Acid, Serum: 7.2 mg/dL (ref 4.0–7.8)

## 2014-10-20 ENCOUNTER — Encounter: Payer: Self-pay | Admitting: Family Medicine

## 2014-10-31 ENCOUNTER — Encounter: Payer: Self-pay | Admitting: Family Medicine

## 2014-10-31 ENCOUNTER — Ambulatory Visit: Payer: 59 | Admitting: Family Medicine

## 2014-10-31 ENCOUNTER — Ambulatory Visit (INDEPENDENT_AMBULATORY_CARE_PROVIDER_SITE_OTHER): Payer: 59 | Admitting: Family Medicine

## 2014-10-31 VITALS — BP 128/78 | HR 76 | Temp 98.2°F | Resp 18

## 2014-10-31 DIAGNOSIS — M1A09X0 Idiopathic chronic gout, multiple sites, without tophus (tophi): Secondary | ICD-10-CM

## 2014-10-31 DIAGNOSIS — M25562 Pain in left knee: Secondary | ICD-10-CM

## 2014-10-31 MED ORDER — PREDNISONE 20 MG PO TABS: ORAL_TABLET | ORAL | Status: DC

## 2014-10-31 MED ORDER — OXYCODONE-ACETAMINOPHEN 7.5-325 MG PO TABS: 1.0000 | ORAL_TABLET | ORAL | Status: DC | PRN

## 2014-10-31 NOTE — Patient Instructions (Signed)
Start prednisone as prescribed I will call with uric acid levels Pain medication Elevate leg  F/U Friday

## 2014-10-31 NOTE — Progress Notes (Signed)
Patient ID: Xavier Daniels, male   DOB: [birth year], 49 y.o.   MRN: [id number]   Subjective:    Patient ID: Xavier Daniels, male    DOB: Nov 24, 1965, 49 y.o.   MRN: [id number]  Patient presents for L Knee Pain patient here with acute left knee pain. Thursday he was mowing the lawn and later that evening noticed swelling in his knee. The swelling has been progressively going up and down of the past couple days. He does not remember any particular injury. His knee is warm to touch. He isn't taking his gout medicine as prescribed and monitoring his diet. He has tried taking some ibuprofen which helped some. When he elevates the leg swelling does go down. He has been using crutches past couple days.    Review Of Systems:  GEN- denies fatigue, fever, weight loss,weakness, recent illness HEENT- denies eye drainage, change in vision, nasal discharge, CVS- denies chest pain, palpitations RESP- denies SOB, cough, wheeze ABD- denies N/V, change in stools, abd pain GU- denies dysuria, hematuria, dribbling, incontinence MSK- + joint pain, muscle aches, injury Neuro- denies headache, dizziness, syncope, seizure activity       Objective:    BP 128/78 mmHg  Pulse 76  Temp(Src) 98.2 F (36.8 C) (Oral)  Resp 18 GEN- NAD, alert and oriented x3 MSK- left knee- + effusion generlized, decreased ROM, ligaments appear in tact, antalgic gait/on crutches, right knee- normal inspection , FROM Ext- no pedal or ankle edema, no swelling of great toe or ankle         Assessment & Plan:      Problem List Items Addressed This Visit    Gout - Primary   Relevant Orders   CBC with Differential/Platelet   Uric Acid    Other Visit Diagnoses    Left knee pain        Based on acute presentation with no injury I think this is due to his Gout. Discussed ortho but pt declined, he does not want TAP done on knee. Start prednisone taper, Uric acid drawn today, percocet  7.5/ 325mg  given, hold NSAIDS on prednisone,  ICE ,elevate, note for work, recheck Friday       Note: This dictation was prepared with Dragon dictation along with smaller phrase technology. Any transcriptional errors that result from this process are unintentional.

## 2014-11-01 LAB — CBC WITH DIFFERENTIAL/PLATELET
BASOS ABS: 0 10*3/uL (ref 0.0–0.1)
Basophils Relative: 0 % (ref 0–1)
EOS PCT: 2 % (ref 0–5)
Eosinophils Absolute: 0.1 10*3/uL (ref 0.0–0.7)
HEMATOCRIT: 41.2 % (ref 39.0–52.0)
Hemoglobin: 13.5 g/dL (ref 13.0–17.0)
LYMPHS ABS: 2.6 10*3/uL (ref 0.7–4.0)
Lymphocytes Relative: 37 % (ref 12–46)
MCH: 28.4 pg (ref 26.0–34.0)
MCHC: 32.8 g/dL (ref 30.0–36.0)
MCV: 86.6 fL (ref 78.0–100.0)
MONO ABS: 0.5 10*3/uL (ref 0.1–1.0)
MPV: 10.8 fL (ref 8.6–12.4)
Monocytes Relative: 7 % (ref 3–12)
Neutro Abs: 3.8 10*3/uL (ref 1.7–7.7)
Neutrophils Relative %: 54 % (ref 43–77)
PLATELETS: 266 10*3/uL (ref 150–400)
RBC: 4.76 MIL/uL (ref 4.22–5.81)
RDW: 15.2 % (ref 11.5–15.5)
WBC: 7.1 10*3/uL (ref 4.0–10.5)

## 2014-11-01 LAB — URIC ACID: Uric Acid, Serum: 5.9 mg/dL (ref 4.0–7.8)

## 2014-11-04 ENCOUNTER — Ambulatory Visit (INDEPENDENT_AMBULATORY_CARE_PROVIDER_SITE_OTHER): Payer: 59 | Admitting: Family Medicine

## 2014-11-04 ENCOUNTER — Encounter: Payer: Self-pay | Admitting: Family Medicine

## 2014-11-04 VITALS — BP 128/72 | HR 88 | Temp 98.6°F | Resp 16 | Ht 73.0 in | Wt 222.0 lb

## 2014-11-04 DIAGNOSIS — M25562 Pain in left knee: Secondary | ICD-10-CM | POA: Diagnosis not present

## 2014-11-04 NOTE — Progress Notes (Signed)
Patient ID: Xavier Daniels, male   DOB: [birth year], 49 y.o.   MRN: [id number]   Subjective:    Patient ID: Xavier Daniels, male    DOB: 1965-06-12, 49 y.o.   MRN: [id number]  Patient presents for F/U Knee Pain  patient to follow-up left knee pain. He was seen a few days ago that time had significant swelling of the left knee as well as decreased range of motion. I was concerned for possible gout based on the acute setting that the swelling popped up however he states that he was smelling the yard a lot and going up and down hills. His uric acid and CBC returned normal. His swelling did respond significantly to the prednisone he still has an area of swelling and tenderness over the lateral aspect of his knee. He is still using the crutches as needed but was able to walk around yesterday without significant pain. He has not required very much of the pain medication.    Review Of Systems:  GEN- denies fatigue, fever, weight loss,weakness, recent illness HEENT- denies eye drainage, change in vision, nasal discharge, CVS- denies chest pain, palpitations RESP- denies SOB, cough, wheeze ABD- denies N/V, change in stools, abd pain GU- denies dysuria, hematuria, dribbling, incontinence MSK- + joint pain, muscle aches, injury Neuro- denies headache, dizziness, syncope, seizure activity       Objective:    BP 128/72 mmHg  Pulse 88  Temp(Src) 98.6 F (37 C) (Oral)  Resp 16  Ht 6\' 1"  (1.854 m)  Wt 222 lb (100.699 kg)  BMI 29.30 kg/m2 GEN- NAD, alert and oriented x3 MSK- Left knee- fair ROM, mild swelling lateral aspect of knee- TTP over region, ligaments in tact, bearing weight       Assessment & Plan:      Problem List Items Addressed This Visit    None    Visit Diagnoses    Left knee pain    -  Primary    Significant improvement in effusion, CBC and uric acid were normal. Obtain xray now effusion is down, if pain and swelling does not completely resolve will send to ortho for  evaluation. He still wants to hold on any injections. He has responded very well to prednisone    Relevant Orders    DG Knee Complete 4 Views Left       Note: This dictation was prepared with Dragon dictation along with smaller phrase technology. Any transcriptional errors that result from this process are unintentional.

## 2014-11-04 NOTE — Patient Instructions (Signed)
Complete the prednisone Get the xray on Monday  F/U as previous

## 2014-11-07 ENCOUNTER — Ambulatory Visit (HOSPITAL_COMMUNITY)
Admission: RE | Admit: 2014-11-07 | Discharge: 2014-11-07 | Disposition: A | Discharge status: Home / Self Care | Payer: Self-pay | Source: Ambulatory Visit | Attending: Family Medicine | Admitting: Family Medicine

## 2014-11-07 ENCOUNTER — Telehealth: Payer: Self-pay | Admitting: Family Medicine

## 2014-11-07 DIAGNOSIS — M25562 Pain in left knee: Secondary | ICD-10-CM | POA: Insufficient documentation

## 2014-11-07 DIAGNOSIS — M25462 Effusion, left knee: Secondary | ICD-10-CM | POA: Insufficient documentation

## 2014-11-07 NOTE — Telephone Encounter (Signed)
Call placed to patient. LMTRC.  

## 2014-11-07 NOTE — Telephone Encounter (Signed)
Okay to give note, see if he needs Ortho appt, I cant keep him out past Wed without the going to Ortho

## 2014-11-07 NOTE — Telephone Encounter (Signed)
Calling, wants to extend his work note through Wednesday.  RTW Thursday.

## 2014-11-07 NOTE — Telephone Encounter (Signed)
MD please advise

## 2014-11-07 NOTE — Telephone Encounter (Signed)
Patient calling to speak with you regarding a doctors note  [phone]

## 2014-11-08 ENCOUNTER — Encounter: Payer: Self-pay | Admitting: Family Medicine

## 2014-11-08 MED ORDER — DICLOFENAC SODIUM 75 MG PO TBEC: 75.0000 mg | DELAYED_RELEASE_TABLET | Freq: Two times a day (BID) | ORAL | Status: DC

## 2014-11-08 NOTE — Telephone Encounter (Signed)
-----   Message from Alycia Rossetti, MD sent at 11/07/2014  4:54 PM EDT ----- Odette Horns shows- mild arthritis, but no fluid on knee currently

## 2014-11-08 NOTE — Telephone Encounter (Signed)
Pt made aware of xray results.  Note through Wednesday written and pt to pick up later.  Is asking about medication other then the Percocet for his arthritic pain.  Something he can use at work?  Please advise.

## 2014-11-08 NOTE — Telephone Encounter (Signed)
Pt called and made aware of new RX.  Rx to pharmacy.  Warned about use of other NSAID'S

## 2014-11-08 NOTE — Telephone Encounter (Signed)
He can take diclofenac 75mg  BID, this is anti-inflammatory, can not take with IBUPROFEN OR ALEVE or other NSAIDS, will not make him sleepy

## 2014-11-11 ENCOUNTER — Telehealth: Payer: Self-pay | Admitting: Family Medicine

## 2014-11-11 MED ORDER — OXYCODONE-ACETAMINOPHEN 7.5-325 MG PO TABS: 1.0000 | ORAL_TABLET | ORAL | Status: DC | PRN

## 2014-11-11 NOTE — Telephone Encounter (Signed)
Patient received #30 tabs on 10/31/2014.  Call placed to patient. States that he has taken all of the prescription D/T pain associated with gout.  Reports that L ankle is now sore.   Patient reports that he has completed Prednisone, and he is continuing to take Allopurinol and Colcrys.   MD please advise.

## 2014-11-11 NOTE — Telephone Encounter (Signed)
[phone]  Patient calling to get rx for his oxycodone

## 2014-11-11 NOTE — Telephone Encounter (Signed)
Prescription printed and patient made aware to come to office to pick up around 4:30pm on 11/11/2014 per VM.

## 2014-11-11 NOTE — Telephone Encounter (Signed)
Okay to refill? 

## 2014-12-02 ENCOUNTER — Telehealth: Payer: Self-pay | Admitting: Family Medicine

## 2014-12-02 NOTE — Telephone Encounter (Signed)
Patient needs to call the doctor that performed the extraction.

## 2014-12-02 NOTE — Telephone Encounter (Signed)
Patient called saying that he had a tooth pulled and would like to know if dr Buelah Manis can call in some ibuprofin if possible  [phone]

## 2014-12-12 ENCOUNTER — Telehealth: Payer: Self-pay | Admitting: Family Medicine

## 2014-12-12 NOTE — Telephone Encounter (Signed)
ROUTED FORMS THAT PATIENT BROUGHT IN TO [facility] ON 12/12/14

## 2014-12-13 NOTE — Telephone Encounter (Signed)
Received forms on my desk I called pt and he stated he needed the forms filled out thru his insurance company for when he was out of work starting on 07/18-07/28/16, pt states he has returned back to work but needed the [facility] form filled out.  Pt was seen on 07/16 for left knee pain.  I have routed this to Dr. Buelah Manis and pt is aware of 2o dollar form fee.  I completed section 4 with physician information.

## 2014-12-13 NOTE — Telephone Encounter (Signed)
Noted,

## 2014-12-19 ENCOUNTER — Encounter: Payer: Self-pay | Admitting: Family Medicine

## 2014-12-26 ENCOUNTER — Ambulatory Visit (INDEPENDENT_AMBULATORY_CARE_PROVIDER_SITE_OTHER): Payer: 59 | Admitting: Family Medicine

## 2014-12-26 ENCOUNTER — Encounter: Payer: Self-pay | Admitting: Family Medicine

## 2014-12-26 VITALS — BP 128/72 | HR 70 | Temp 98.0°F | Resp 14 | Ht 73.0 in | Wt 235.0 lb

## 2014-12-26 DIAGNOSIS — E785 Hyperlipidemia, unspecified: Secondary | ICD-10-CM | POA: Diagnosis not present

## 2014-12-26 DIAGNOSIS — Z23 Encounter for immunization: Secondary | ICD-10-CM

## 2014-12-26 DIAGNOSIS — M1A09X0 Idiopathic chronic gout, multiple sites, without tophus (tophi): Secondary | ICD-10-CM

## 2014-12-26 DIAGNOSIS — E663 Overweight: Secondary | ICD-10-CM | POA: Diagnosis not present

## 2014-12-26 DIAGNOSIS — I10 Essential (primary) hypertension: Secondary | ICD-10-CM

## 2014-12-26 MED ORDER — IBUPROFEN 800 MG PO TABS: 800.0000 mg | ORAL_TABLET | Freq: Three times a day (TID) | ORAL | Status: DC | PRN

## 2014-12-26 MED ORDER — ATENOLOL 50 MG PO TABS: 50.0000 mg | ORAL_TABLET | Freq: Every day | ORAL | Status: DC

## 2014-12-26 NOTE — Assessment & Plan Note (Signed)
Weight up 13 pounds discussed the importance of limiting his cheat days and junk food. If he eats clean at least 80% of the time then he will not gain weight when he doesn't old with his family or on outings. Advised to increase his protein to keep him fall during the day especially with his weightlifting

## 2014-12-26 NOTE — Addendum Note (Signed)
Addended by: Sheral Flow on: 12/26/2014 08:52 AM   Modules accepted: Orders, Medications

## 2014-12-26 NOTE — Progress Notes (Signed)
Patient ID: Xavier Daniels, male   DOB: 03-Apr-1966, 49 y.o.   MRN: [id number]   Subjective:    Patient ID: Xavier Daniels, male    DOB: 21-Dec-1965, 49 y.o.   MRN: [id number]  Patient presents for 3 month F/U patient here to follow-up hypertension. He's taken his blood pressure medicines as prescribed.No concerns today. Requested refill on IBuprofen he uses for dental pain and aches, likes this better than Diclofenac. Has not picked up last script of percocet yet.  He states he went off his diet and has been eating a lot of junk food- weight up 13lbs. He plans to restart diet  Next week, and will began lifting weights     Review Of Systems:  GEN- denies fatigue, fever, weight loss,weakness, recent illness HEENT- denies eye drainage, change in vision, nasal discharge, CVS- denies chest pain, palpitations RESP- denies SOB, cough, wheeze ABD- denies N/V, change in stools, abd pain GU- denies dysuria, hematuria, dribbling, incontinence MSK- denies joint pain, muscle aches, injury Neuro- denies headache, dizziness, syncope, seizure activity       Objective:    BP 128/72 mmHg  Pulse 70  Temp(Src) 98 F (36.7 C) (Oral)  Resp 14  Ht 6\' 1"  (1.854 m)  Wt 235 lb (106.595 kg)  BMI 31.01 kg/m2 GEN- NAD, alert and oriented x3 HEENT- PERRL, EOMI, non injected sclera, pink conjunctiva, MMM, oropharynx clear CVS- RRR, no murmur RESP-CTAB EXT- No edema Pulses- Radial  2+        Assessment & Plan:      Problem List Items Addressed This Visit    None      Note: This dictation was prepared with Dragon dictation along with smaller phrase technology. Any transcriptional errors that result from this process are unintentional.

## 2014-12-26 NOTE — Assessment & Plan Note (Signed)
Doing well on allopurinol. Flu shot given

## 2014-12-26 NOTE — Patient Instructions (Addendum)
Stop the norvasc  FLu shot given  Ibuprofen Work on the weight loss, down at least 5 pounds F/U 6 months - PHYSICAL - DO NOT EAT

## 2014-12-26 NOTE — Assessment & Plan Note (Signed)
Blood pressure is well controlled to stop the amlodipine altogether and keep him on atenolol

## 2015-02-14 ENCOUNTER — Telehealth: Payer: Self-pay | Admitting: *Deleted

## 2015-02-14 NOTE — Telephone Encounter (Signed)
Prescription for Atenolol sent to pharmacy on 12/26/2014 with #6 refills. Unsure of why pharmacy is stating there are no refills at this time.   Patient dismissed from [facility] pending past due balance on 12/26/2014. No further refills will be given until past due amount is settled.   Call placed to patient. Maud.

## 2015-02-14 NOTE — Telephone Encounter (Signed)
Pt called needing refill on atenolol, states pharmacy stated was not refills left  Last refill 12/30/14  [facility]  Call back number [phone]

## 2015-02-14 NOTE — Telephone Encounter (Signed)
Patient returned call and made aware.   Transferred to front desk to make payment.   Transferred back to triage to have prescription refilled.   Call placed to pharmacy and was made aware that prescription is available for pick up at this time.   Patient made aware.

## 2015-02-24 ENCOUNTER — Encounter: Payer: Self-pay | Admitting: Family Medicine

## 2015-02-24 ENCOUNTER — Ambulatory Visit (INDEPENDENT_AMBULATORY_CARE_PROVIDER_SITE_OTHER): Payer: BLUE CROSS/BLUE SHIELD | Admitting: Family Medicine

## 2015-02-24 VITALS — BP 130/68 | HR 74 | Temp 98.3°F | Resp 12 | Ht 73.0 in | Wt 238.0 lb

## 2015-02-24 DIAGNOSIS — N390 Urinary tract infection, site not specified: Secondary | ICD-10-CM | POA: Diagnosis not present

## 2015-02-24 LAB — URINALYSIS, ROUTINE W REFLEX MICROSCOPIC
Bilirubin Urine: NEGATIVE
GLUCOSE, UA: NEGATIVE
Hgb urine dipstick: NEGATIVE
Ketones, ur: NEGATIVE
LEUKOCYTES UA: NEGATIVE
Nitrite: NEGATIVE
Specific Gravity, Urine: 1.025 (ref 1.001–1.035)
pH: 6 (ref 5.0–8.0)

## 2015-02-24 MED ORDER — CIPROFLOXACIN HCL 500 MG PO TABS: 500.0000 mg | ORAL_TABLET | Freq: Two times a day (BID) | ORAL | Status: DC

## 2015-02-24 NOTE — Progress Notes (Signed)
Patient ID: Xavier Daniels, male   DOB: 05/06/1965, 49 y.o.   MRN: [id number]   Subjective:    Patient ID: Xavier Daniels, male    DOB: November 24, 1965, 49 y.o.   MRN: [id number]  Patient presents for Dysuria  patient here with urinary frequency dysuria left lower abdominal pain that started a week ago. He noticed that he was going very often and he would get a lot of pressure in his bladder area and also some pain on the left side. There was no change in his bowels. He started drinking cranberry juice. He also noticed burning with urination especially at the tip of the penis. He denies any drainage from the penis. He Some leftover antibiotics from his dentist she is not sure the name of the antibiotics he took one a day for 3 days and his symptoms have vastly improved. Today he is not having any pain with urination everything seems to be back to normal. The last antibiotic was taken yesterday.     Review Of Systems:  GEN- denies fatigue, fever, weight loss,weakness, recent illness HEENT- denies eye drainage, change in vision, nasal discharge, CVS- denies chest pain, palpitations RESP- denies SOB, cough, wheeze ABD- denies N/V, change in stools, +abd pain GU- + dysuria, hematuria, dribbling, incontinence MSK- denies joint pain, muscle aches, injury Neuro- denies headache, dizziness, syncope, seizure activity       Objective:    BP 130/68 mmHg  Pulse 74  Temp(Src) 98.3 F (36.8 C) (Oral)  Resp 12  Ht 6\' 1"  (1.854 m)  Wt 238 lb (107.956 kg)  BMI 31.41 kg/m2 GEN- NAD, alert and oriented x3 CVS- RRR, no murmur RESP-CTAB ABD-NABS,soft,NT,ND, no CVA tenderness        Assessment & Plan:      Problem List Items Addressed This Visit    None    Visit Diagnoses    UTI (lower urinary tract infection)    -  Primary    Based on symptoms, concern for UTI, will give 3 more days of cipro since he started treatment    Relevant Orders    Urinalysis, Routine w reflex microscopic (not at  [facility]) (Completed)       Note: This dictation was prepared with Dragon dictation along with smaller phrase technology. Any transcriptional errors that result from this process are unintentional.

## 2015-02-24 NOTE — Patient Instructions (Signed)
Take the antibiotics for the next 3 days Keep up your water F/U as previous

## 2015-04-12 ENCOUNTER — Telehealth: Payer: Self-pay | Admitting: Family Medicine

## 2015-04-12 NOTE — Telephone Encounter (Signed)
Pt called to request a higher dose of Allopurinol  [phone]

## 2015-05-31 ENCOUNTER — Ambulatory Visit (INDEPENDENT_AMBULATORY_CARE_PROVIDER_SITE_OTHER): Payer: BLUE CROSS/BLUE SHIELD | Admitting: Family Medicine

## 2015-05-31 ENCOUNTER — Encounter: Payer: Self-pay | Admitting: Family Medicine

## 2015-05-31 VITALS — BP 138/78 | HR 74 | Temp 98.6°F | Resp 16 | Ht 73.0 in | Wt 240.0 lb

## 2015-05-31 DIAGNOSIS — S63501A Unspecified sprain of right wrist, initial encounter: Secondary | ICD-10-CM

## 2015-05-31 DIAGNOSIS — M1A09X0 Idiopathic chronic gout, multiple sites, without tophus (tophi): Secondary | ICD-10-CM

## 2015-05-31 DIAGNOSIS — S46911A Strain of unspecified muscle, fascia and tendon at shoulder and upper arm level, right arm, initial encounter: Secondary | ICD-10-CM

## 2015-05-31 DIAGNOSIS — S29012A Strain of muscle and tendon of back wall of thorax, initial encounter: Secondary | ICD-10-CM

## 2015-05-31 DIAGNOSIS — S233XXA Sprain of ligaments of thoracic spine, initial encounter: Secondary | ICD-10-CM

## 2015-05-31 MED ORDER — OXYCODONE-ACETAMINOPHEN 7.5-325 MG PO TABS
1.0000 | ORAL_TABLET | ORAL | Status: DC | PRN
Start: 2015-05-31 — End: 2015-12-15

## 2015-05-31 MED ORDER — COLCHICINE 0.6 MG PO TABS: 0.6000 mg | ORAL_TABLET | Freq: Every day | ORAL | Status: DC

## 2015-05-31 MED ORDER — METHOCARBAMOL 500 MG PO TABS: 500.0000 mg | ORAL_TABLET | Freq: Three times a day (TID) | ORAL | Status: DC | PRN

## 2015-05-31 NOTE — Patient Instructions (Addendum)
Do stretches for your back and shoulder No lifting weights for the next 2 weeks, then start slow  Take pain medications Take ibuprofen twice a day with food  Muscle relaxer as needed  F/U as previous

## 2015-05-31 NOTE — Assessment & Plan Note (Signed)
Give [employer], hopefully insurance will cover, use prn flares, continue allopurinol
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
## 2015-06-26 ENCOUNTER — Encounter: Payer: 59 | Admitting: Family Medicine

## 2015-06-28 ENCOUNTER — Ambulatory Visit (INDEPENDENT_AMBULATORY_CARE_PROVIDER_SITE_OTHER): Payer: BLUE CROSS/BLUE SHIELD | Admitting: Family Medicine

## 2015-06-28 ENCOUNTER — Encounter: Payer: Self-pay | Admitting: Family Medicine

## 2015-06-28 ENCOUNTER — Telehealth: Payer: Self-pay | Admitting: *Deleted

## 2015-06-28 VITALS — BP 128/78 | HR 64 | Temp 98.1°F | Resp 12 | Ht 73.0 in | Wt 244.0 lb

## 2015-06-28 DIAGNOSIS — Z1211 Encounter for screening for malignant neoplasm of colon: Secondary | ICD-10-CM | POA: Diagnosis not present

## 2015-06-28 DIAGNOSIS — E669 Obesity, unspecified: Secondary | ICD-10-CM

## 2015-06-28 DIAGNOSIS — R599 Enlarged lymph nodes, unspecified: Secondary | ICD-10-CM | POA: Diagnosis not present

## 2015-06-28 DIAGNOSIS — I10 Essential (primary) hypertension: Secondary | ICD-10-CM | POA: Diagnosis not present

## 2015-06-28 DIAGNOSIS — Z125 Encounter for screening for malignant neoplasm of prostate: Secondary | ICD-10-CM

## 2015-06-28 DIAGNOSIS — E785 Hyperlipidemia, unspecified: Secondary | ICD-10-CM | POA: Diagnosis not present

## 2015-06-28 DIAGNOSIS — Z0000 Encounter for general adult medical examination without abnormal findings: Secondary | ICD-10-CM

## 2015-06-28 DIAGNOSIS — R590 Localized enlarged lymph nodes: Secondary | ICD-10-CM

## 2015-06-28 LAB — COMPREHENSIVE METABOLIC PANEL
ALBUMIN: 4.5 g/dL (ref 3.6–5.1)
ALK PHOS: 94 U/L (ref 40–115)
ALT: 19 U/L (ref 9–46)
AST: 16 U/L (ref 10–35)
BILIRUBIN TOTAL: 0.6 mg/dL (ref 0.2–1.2)
BUN: 14 mg/dL (ref 7–25)
CALCIUM: 9.9 mg/dL (ref 8.6–10.3)
CO2: 25 mmol/L (ref 20–31)
Chloride: 102 mmol/L (ref 98–110)
Creat: 1.19 mg/dL (ref 0.70–1.33)
GLUCOSE: 110 mg/dL — AB (ref 70–99)
Potassium: 4 mmol/L (ref 3.5–5.3)
Sodium: 138 mmol/L (ref 135–146)
Total Protein: 7.7 g/dL (ref 6.1–8.1)

## 2015-06-28 LAB — CBC WITH DIFFERENTIAL/PLATELET
Basophils Absolute: 0 10*3/uL (ref 0.0–0.1)
Basophils Relative: 0 % (ref 0–1)
EOS ABS: 0.1 10*3/uL (ref 0.0–0.7)
EOS PCT: 2 % (ref 0–5)
HEMATOCRIT: 43.8 % (ref 39.0–52.0)
HEMOGLOBIN: 14.3 g/dL (ref 13.0–17.0)
LYMPHS ABS: 2 10*3/uL (ref 0.7–4.0)
LYMPHS PCT: 41 % (ref 12–46)
MCH: 28.5 pg (ref 26.0–34.0)
MCHC: 32.6 g/dL (ref 30.0–36.0)
MCV: 87.3 fL (ref 78.0–100.0)
MONO ABS: 0.3 10*3/uL (ref 0.1–1.0)
MONOS PCT: 7 % (ref 3–12)
MPV: 11.2 fL (ref 8.6–12.4)
Neutro Abs: 2.5 10*3/uL (ref 1.7–7.7)
Neutrophils Relative %: 50 % (ref 43–77)
Platelets: 224 10*3/uL (ref 150–400)
RBC: 5.02 MIL/uL (ref 4.22–5.81)
RDW: 15.6 % — AB (ref 11.5–15.5)
WBC: 4.9 10*3/uL (ref 4.0–10.5)

## 2015-06-28 LAB — LIPID PANEL
Cholesterol: 204 mg/dL — ABNORMAL HIGH (ref 125–200)
HDL: 41 mg/dL (ref >=40)
LDL Cholesterol: 135 mg/dL — ABNORMAL HIGH (ref <130)
Total CHOL/HDL Ratio: 5 Ratio (ref <=5.0)
Triglycerides: 138 mg/dL (ref <150)
VLDL: 28 mg/dL (ref <30)

## 2015-06-28 NOTE — Patient Instructions (Addendum)
CT of chest to be done - for lymph nodes  Take 1/2 tablet of atenolol  Work on diet and weight loss F/U 4 months

## 2015-06-28 NOTE — Telephone Encounter (Signed)
Pt has appt scheduled at [facility] on Mon March 20th at 4pm, pt needs to arrive at 345pm. Lmtrc to pt for appt info

## 2015-06-29 LAB — PSA: PSA: 0.9 ng/mL (ref <=4.00)

## 2015-06-29 NOTE — Progress Notes (Signed)
Patient ID: Xavier Daniels, male   DOB: Jul 08, 1965, 50 y.o.   MRN: [id number]   Subjective:    Patient ID: Xavier Daniels, male    DOB: January 22, 1966, 50 y.o.   MRN: [id number]  Patient presents for CPE Patient here for complete physical exam. He was noted to start lifting weights. He states that he recently remembered that he had some lymph nodes around his heart but was told him a few years ago. He never had this rechecked. After review of his records I can only find in his emergency room visits where he went for chest pain back in 2014 at that time EKG which was unremarkable he did have a CT scan done showed hilar lymphadenopathy concerning for sarcoidosis. He was postop follow-up at that time he was going to the health department and states that he never received any follow-up on this. He occasionally gets short winded if he is doing activities but still exercises and moves around without any specific difficulty.  He is due for colonoscopy  IMMUNIZATIONS are up-to-date Due for fasting labs  He also wants to go off of his blood pressure medication  Review Of Systems:  GEN- denies fatigue, fever, weight loss,weakness, recent illness HEENT- denies eye drainage, change in vision, nasal discharge, CVS- denies chest pain, palpitations RESP- + SOB, denies cough, wheeze ABD- denies N/V, change in stools, abd pain GU- denies dysuria, hematuria, dribbling, incontinence MSK- denies joint pain, muscle aches, injury Neuro- denies headache, dizziness, syncope, seizure activity       Objective:    BP 128/78 mmHg  Pulse 64  Temp(Src) 98.1 F (36.7 C) (Oral)  Resp 12  Ht 6\' 1"  (1.854 m)  Wt 244 lb (110.678 kg)  BMI 32.20 kg/m2 GEN- NAD, alert and oriented x3 HEENT- PERRL, EOMI, non injected sclera, pink conjunctiva, MMM, oropharynx clear Neck- Supple, no thyromegaly CVS- RRR, no murmur RESP-CTAB ABD-NABS,soft,NT,ND GU- rectum normal tone, FOBT neg, prosate smooth, no nodules  EXT- No  edema Pulses- Radial, DP- 2+        Assessment & Plan:      Problem List Items Addressed This Visit    Obesity   Hyperlipidemia   Relevant Orders   Lipid panel (Completed)   Essential hypertension    Blood pressure looks okay. He was a trial off of this medication. Instead I will break his medication in half and will take 25 mg of atenolol I'm concerned because he does have weight gain about stopping his medication altogether. He will continue to monitor his blood pressure at home on the half dose.      Relevant Orders   EKG 12-Lead (Completed)    Other Visit Diagnoses    Routine general medical examination at a health care facility    -  Primary    Physical done. Immunizations up-to-date PSA screening to be done colonoscopy referral    Relevant Orders    CBC with Differential/Platelet (Completed)    Comprehensive metabolic panel (Completed)    Hilar lymphadenopathy        Noted on previous CT scan a few years ago we'll recheck though his occasional shortness of breath is not quite up to sarcoidosis    Relevant Orders    CT Chest W Contrast    Prostate cancer screening        Relevant Orders    PSA (Completed)    Colon cancer screening        Relevant Orders    Ambulatory  referral to Gastroenterology       Note: This dictation was prepared with Dragon dictation along with smaller phrase technology. Any transcriptional errors that result from this process are unintentional.

## 2015-06-29 NOTE — Telephone Encounter (Signed)
Pt called back and aware of appt 

## 2015-06-29 NOTE — Assessment & Plan Note (Signed)
Blood pressure looks okay. He was a trial off of this medication. Instead I will break his medication in half and will take 25 mg of atenolol I'm concerned because he does have weight gain about stopping his medication altogether. He will continue to monitor his blood pressure at home on the half dose.

## 2015-07-03 ENCOUNTER — Ambulatory Visit (HOSPITAL_COMMUNITY)
Admission: RE | Admit: 2015-07-03 | Discharge: 2015-07-03 | Disposition: A | Discharge status: Home / Self Care | Payer: BLUE CROSS/BLUE SHIELD | Source: Ambulatory Visit | Attending: Family Medicine | Admitting: Family Medicine

## 2015-07-03 DIAGNOSIS — R590 Localized enlarged lymph nodes: Secondary | ICD-10-CM | POA: Diagnosis present

## 2015-07-03 DIAGNOSIS — R918 Other nonspecific abnormal finding of lung field: Secondary | ICD-10-CM | POA: Insufficient documentation

## 2015-07-03 DIAGNOSIS — R0602 Shortness of breath: Secondary | ICD-10-CM | POA: Diagnosis present

## 2015-07-03 MED ORDER — IOHEXOL 300 MG/ML  SOLN
75.0000 mL | Freq: Once | INTRAMUSCULAR | Status: AC | PRN
  Administered 2015-07-03: 75 mL via INTRAVENOUS

## 2015-07-05 ENCOUNTER — Other Ambulatory Visit: Payer: Self-pay | Admitting: Family Medicine

## 2015-07-05 DIAGNOSIS — D869 Sarcoidosis, unspecified: Secondary | ICD-10-CM

## 2015-07-11 ENCOUNTER — Telehealth: Payer: Self-pay | Admitting: *Deleted

## 2015-07-11 NOTE — Telephone Encounter (Signed)
pt has appt scheduled for 08/29/15 at 11:30am with Dr. Luan Pulling, lmtrc for appt

## 2015-07-11 NOTE — Telephone Encounter (Signed)
Pt aware of appt.

## 2015-07-14 ENCOUNTER — Other Ambulatory Visit: Payer: Self-pay | Admitting: Family Medicine

## 2015-07-14 NOTE — Telephone Encounter (Signed)
Refill appropriate and filled per protocol. 

## 2015-09-04 ENCOUNTER — Encounter: Payer: Self-pay | Admitting: Physician Assistant

## 2015-09-04 ENCOUNTER — Ambulatory Visit (INDEPENDENT_AMBULATORY_CARE_PROVIDER_SITE_OTHER): Payer: BLUE CROSS/BLUE SHIELD | Admitting: Physician Assistant

## 2015-09-04 ENCOUNTER — Encounter: Payer: Self-pay | Admitting: Family Medicine

## 2015-09-04 VITALS — BP 124/84 | HR 64 | Temp 97.6°F | Resp 18 | Wt 251.0 lb

## 2015-09-04 DIAGNOSIS — J029 Acute pharyngitis, unspecified: Secondary | ICD-10-CM

## 2015-09-04 DIAGNOSIS — J309 Allergic rhinitis, unspecified: Secondary | ICD-10-CM

## 2015-09-04 LAB — STREP GROUP A AG, W/REFLEX TO CULT: STREGTOCOCCUS GROUP A AG SCREEN: NOT DETECTED

## 2015-09-04 MED ORDER — CETIRIZINE HCL 10 MG PO TABS: 10.0000 mg | ORAL_TABLET | Freq: Every day | ORAL | Status: DC

## 2015-09-04 MED ORDER — AMOXICILLIN 875 MG PO TABS: 875.0000 mg | ORAL_TABLET | Freq: Two times a day (BID) | ORAL | Status: DC

## 2015-09-04 NOTE — Progress Notes (Signed)
Patient ID: JEFFRY RISS MRN: [id number]:[id number], DOB: [birth year], 50 y.o. Date of Encounter: 09/04/2015, 11:18 AM    Chief Complaint:  Chief Complaint  Patient presents with  . sick 2-3 days    wife,son have strep     HPI: 50 y.o. year old AA male presents with above.   Says that oth his wife and son have strep. Says his throat is extremely sore. Is staying sore all day. Says when he has hacky cough, this really hurts his throat. Having no chest congestion/congested cough.. No mucus from the nose.   Says this year his "allergies have been much worse than usual." Says he has been told he could get Rx for allergy med and it would be cheaper than buying otc. Wants to have this available to use when needed.   No other complaint/concern.      Home Meds:   Outpatient Prescriptions Prior to Visit  Medication Sig Dispense Refill  . allopurinol (ZYLOPRIM) 100 MG tablet TAKE ONE TABLET BY MOUTH ONCE DAILY 30 tablet 3  . atenolol (TENORMIN) 50 MG tablet Take 1 tablet (50 mg total) by mouth daily. 30 tablet 6  . colchicine 0.6 MG tablet Take 1 tablet (0.6 mg total) by mouth daily. 30 tablet 1  . fluticasone (FLONASE) 50 MCG/ACT nasal spray Place 2 sprays into both nostrils daily. 16 g 6  . ibuprofen (ADVIL,MOTRIN) 800 MG tablet Take 1 tablet (800 mg total) by mouth every 8 (eight) hours as needed. 60 tablet 2  . Multiple Vitamin (MULTIVITAMIN WITH MINERALS) TABS tablet Take 1 tablet by mouth daily. Reported on 06/28/2015    . oxyCODONE-acetaminophen (PERCOCET) 7.5-325 MG tablet Take 1 tablet by mouth every 4 (four) hours as needed for severe pain. 30 tablet 0   No facility-administered medications prior to visit.    Allergies: No Known Allergies    Review of Systems: See HPI for pertinent ROS. All other ROS negative.    Physical Exam: Blood pressure 124/84, pulse 64, temperature 97.6 F (36.4 C), temperature source Oral, resp. rate 18, weight 251 lb (113.853 kg)., Body mass index is  33.12 kg/(m^2). General:  WNWD AAM. Appears in no acute distress. HEENT: Normocephalic, atraumatic, eyes without discharge, sclera non-icteric, nares are without discharge. Bilateral auditory canals clear, TM's are without perforation, pearly grey and translucent with reflective cone of light bilaterally. Oral cavity moist, Moderate erythema bilaterally. Small amount exudate. No peritonsillar abscess.  Neck: Supple. No thyromegaly. I feel no enlarged lymph nodes but he dose report tenderness Right>Left.  Lungs: Clear bilaterally to auscultation without wheezes, rales, or rhonchi. Breathing is unlabored. Heart: Regular rhythm. No murmurs, rubs, or gallops. Msk:  Strength and tone normal for age. Extremities/Skin: Warm and dry.  No rashes. Neuro: Alert and oriented X 3. Moves all extremities spontaneously. Gait is normal. CNII-XII grossly in tact. Psych:  Responds to questions appropriately with a normal affect.     ASSESSMENT AND PLAN:  50 y.o. year old male with  1. Sorethroat Strep test was not complete, but regardless of test results, clinicallly c/w strep. Start Amox immediately, take as directed, complete all of it.  F/U if symptoms worsen or are not improved in 48-72 hours. F/U if symptoms nbot completely resolved upon compltiton of abx.  - STREP GROUP A AG, W/REFLEX TO CULT - amoxicillin (AMOXIL) 875 MG tablet; Take 1 tablet (875 mg total) by mouth 2 (two) times daily.  Dispense: 20 tablet; Refill: 0  2. Allergic rhinitis, unspecified  allergic rhinitis type - cetirizine (ZYRTEC) 10 MG tablet; Take 1 tablet (10 mg total) by mouth daily.  Dispense: 30 tablet; Refill: [address] [place], [place], [facility] 09/04/2015 11:18 AM

## 2015-09-06 LAB — CULTURE, GROUP A STREP: Organism ID, Bacteria: NORMAL

## 2015-09-10 ENCOUNTER — Other Ambulatory Visit: Payer: Self-pay | Admitting: Family Medicine

## 2015-09-12 NOTE — Telephone Encounter (Signed)
Refill appropriate and filled per protocol. 

## 2015-10-30 ENCOUNTER — Encounter: Payer: Self-pay | Admitting: Family Medicine

## 2015-10-30 ENCOUNTER — Ambulatory Visit (INDEPENDENT_AMBULATORY_CARE_PROVIDER_SITE_OTHER): Payer: BLUE CROSS/BLUE SHIELD | Admitting: Family Medicine

## 2015-10-30 VITALS — BP 132/70 | HR 72 | Temp 97.9°F | Resp 14 | Ht 73.0 in | Wt 254.0 lb

## 2015-10-30 DIAGNOSIS — E669 Obesity, unspecified: Secondary | ICD-10-CM | POA: Diagnosis not present

## 2015-10-30 DIAGNOSIS — E785 Hyperlipidemia, unspecified: Secondary | ICD-10-CM | POA: Diagnosis not present

## 2015-10-30 DIAGNOSIS — I10 Essential (primary) hypertension: Secondary | ICD-10-CM | POA: Diagnosis not present

## 2015-10-30 NOTE — Assessment & Plan Note (Signed)
Well controlled 

## 2015-10-30 NOTE — Progress Notes (Signed)
Patient ID: Xavier Daniels, male   DOB: 1966-02-24, 50 y.o.   MRN: [id number]   Subjective:    Patient ID: Xavier Daniels, male    DOB: [birth year], 50 y.o.   MRN: [id number]  Patient presents for 4 month F/U   Patient for follow-up chronic medical process. He has no particular concerns but his weight is up since her last visit. He admits that he was not following his diet he was in a lot of ice cream some junk food and drinking soft drinks. He was trying to workout with the like he was building more muscle in his upper body but was not losing any significant weight since he's cut back on the weight lifting. He is taking all his medications as prescribed. He is asked to still taking an entire tablet of atenolol as he noted he was not losing weight after the last visit was concerned his blood pressure would go back up. He has not had any problems with gout recently.    Review Of Systems:  GEN- denies fatigue, fever, weight loss,weakness, recent illness HEENT- denies eye drainage, change in vision, nasal discharge, CVS- denies chest pain, palpitations RESP- denies SOB, cough, wheeze ABD- denies N/V, change in stools, abd pain GU- denies dysuria, hematuria, dribbling, incontinence MSK- denies joint pain, muscle aches, injury Neuro- denies headache, dizziness, syncope, seizure activity       Objective:    BP 132/70 mmHg  Pulse 72  Temp(Src) 97.9 F (36.6 C) (Oral)  Resp 14  Ht 6\' 1"  (1.854 m)  Wt 254 lb (115.214 kg)  BMI 33.52 kg/m2 GEN- NAD, alert and oriented x3 HEENT- PERRL, EOMI, non injected sclera, pink conjunctiva, MMM, oropharynx clear CVS- RRR, no murmur RESP-CTAB EXT- No edema Pulses- Radial 2+        Assessment & Plan:      Problem List Items Addressed This Visit    Obesity   Hyperlipidemia    Check fasting labs, cholesterol had increased 4 months ago  Low fat diet, return to previous healthier eating habits       Relevant Orders   Lipid panel   Essential  hypertension - Primary    Well controlled      Relevant Orders   CBC with Differential/Platelet   Comprehensive metabolic panel      Note: This dictation was prepared with Dragon dictation along with smaller phrase technology. Any transcriptional errors that result from this process are unintentional.

## 2015-10-30 NOTE — Assessment & Plan Note (Signed)
Check fasting labs, cholesterol had increased 4 months ago  Low fat diet, return to previous healthier eating habits

## 2015-10-30 NOTE — Patient Instructions (Signed)
Work on diet and weight loss  F/U 4 months

## 2015-10-31 LAB — CBC WITH DIFFERENTIAL/PLATELET
BASOS ABS: 49 {cells}/uL (ref 0–200)
BASOS PCT: 1 %
EOS PCT: 4 %
Eosinophils Absolute: 196 cells/uL (ref 15–500)
HCT: 42.4 % (ref 38.5–50.0)
HEMOGLOBIN: 13.9 g/dL (ref 13.0–17.0)
LYMPHS ABS: 2009 {cells}/uL (ref 850–3900)
Lymphocytes Relative: 41 %
MCH: 28.7 pg (ref 27.0–33.0)
MCHC: 32.8 g/dL (ref 32.0–36.0)
MCV: 87.4 fL (ref 80.0–100.0)
MONOS PCT: 9 %
MPV: 11.1 fL (ref 7.5–12.5)
Monocytes Absolute: 441 cells/uL (ref 200–950)
NEUTROS PCT: 45 %
Neutro Abs: 2205 cells/uL (ref 1500–7800)
PLATELETS: 218 10*3/uL (ref 140–400)
RBC: 4.85 MIL/uL (ref 4.20–5.80)
RDW: 15.2 % — ABNORMAL HIGH (ref 11.0–15.0)
WBC: 4.9 10*3/uL (ref 3.8–10.8)

## 2015-10-31 LAB — COMPREHENSIVE METABOLIC PANEL
ALBUMIN: 4.3 g/dL (ref 3.6–5.1)
ALK PHOS: 54 U/L (ref 40–115)
ALT: 25 U/L (ref 9–46)
AST: 22 U/L (ref 10–35)
BILIRUBIN TOTAL: 0.9 mg/dL (ref 0.2–1.2)
BUN: 19 mg/dL (ref 7–25)
CHLORIDE: 106 mmol/L (ref 98–110)
CO2: 24 mmol/L (ref 20–31)
CREATININE: 1.14 mg/dL (ref 0.70–1.33)
Calcium: 9.5 mg/dL (ref 8.6–10.3)
Glucose, Bld: 97 mg/dL (ref 70–99)
Potassium: 4.3 mmol/L (ref 3.5–5.3)
SODIUM: 138 mmol/L (ref 135–146)
TOTAL PROTEIN: 7.2 g/dL (ref 6.1–8.1)

## 2015-10-31 LAB — LIPID PANEL
CHOLESTEROL: 185 mg/dL (ref 125–200)
HDL: 41 mg/dL (ref >=40)
LDL Cholesterol: 114 mg/dL (ref <130)
TRIGLYCERIDES: 148 mg/dL (ref <150)
Total CHOL/HDL Ratio: 4.5 Ratio (ref <=5.0)
VLDL: 30 mg/dL (ref <30)

## 2015-11-02 ENCOUNTER — Encounter: Payer: Self-pay | Admitting: Family Medicine

## 2015-11-08 ENCOUNTER — Other Ambulatory Visit: Payer: Self-pay | Admitting: Family Medicine

## 2015-11-08 NOTE — Telephone Encounter (Signed)
Refill appropriate and filled per protocol. 

## 2015-11-14 ENCOUNTER — Other Ambulatory Visit: Payer: Self-pay | Admitting: Family Medicine

## 2015-11-15 NOTE — Telephone Encounter (Signed)
Refill appropriate and filled per protocol. 

## 2015-12-13 ENCOUNTER — Other Ambulatory Visit: Payer: Self-pay | Admitting: Family Medicine

## 2015-12-13 MED ORDER — ATENOLOL 50 MG PO TABS
50.0000 mg | ORAL_TABLET | Freq: Every day | ORAL | 0 refills | Status: DC
Start: 2015-12-13 — End: 2016-04-22

## 2015-12-13 NOTE — Telephone Encounter (Signed)
Temp refill of Atenolol to [place] [facility] due to manufacturer back order

## 2015-12-15 ENCOUNTER — Emergency Department (HOSPITAL_COMMUNITY)
Admission: EM | Admit: 2015-12-15 | Discharge: 2015-12-15 | Disposition: A | Discharge status: Home / Self Care | Payer: PRIVATE HEALTH INSURANCE | Attending: Emergency Medicine | Admitting: Emergency Medicine

## 2015-12-15 ENCOUNTER — Encounter (HOSPITAL_COMMUNITY): Payer: Self-pay | Admitting: Emergency Medicine

## 2015-12-15 DIAGNOSIS — Z79899 Other long term (current) drug therapy: Secondary | ICD-10-CM | POA: Insufficient documentation

## 2015-12-15 DIAGNOSIS — M10022 Idiopathic gout, left elbow: Secondary | ICD-10-CM | POA: Diagnosis not present

## 2015-12-15 DIAGNOSIS — I10 Essential (primary) hypertension: Secondary | ICD-10-CM | POA: Diagnosis not present

## 2015-12-15 DIAGNOSIS — Z791 Long term (current) use of non-steroidal anti-inflammatories (NSAID): Secondary | ICD-10-CM | POA: Diagnosis not present

## 2015-12-15 DIAGNOSIS — M109 Gout, unspecified: Secondary | ICD-10-CM

## 2015-12-15 DIAGNOSIS — M25522 Pain in left elbow: Secondary | ICD-10-CM | POA: Diagnosis present

## 2015-12-15 MED ORDER — OXYCODONE-ACETAMINOPHEN 5-325 MG PO TABS: 1.0000 | ORAL_TABLET | ORAL | 0 refills | Status: DC | PRN

## 2015-12-15 MED ORDER — COLCHICINE 0.6 MG PO TABS
1.2000 mg | ORAL_TABLET | Freq: Once | ORAL | Status: AC
  Administered 2015-12-15: 1.2 mg via ORAL
  Filled 2015-12-15: qty 2

## 2015-12-15 MED ORDER — COLCHICINE 0.6 MG PO TABS
0.6000 mg | ORAL_TABLET | Freq: Once | ORAL | Status: AC
  Administered 2015-12-15: 0.6 mg via ORAL
  Filled 2015-12-15: qty 1

## 2015-12-15 NOTE — ED Notes (Signed)
Pt made aware to return if symptoms worsen or if any life threatening symptoms occur.   

## 2015-12-15 NOTE — ED Triage Notes (Signed)
Pt c/o left elobw pain and sewlling. Nad.

## 2015-12-15 NOTE — Discharge Instructions (Signed)
Take your last dose of the colchicine in one hour as discussed. I recommend also taking your home ibuprofen 600 mg every 8 hours if needed for continued swelling.

## 2015-12-17 NOTE — ED Provider Notes (Signed)
[facility] DEPT Provider Note   CSN: [id number] Arrival date & time: 12/15/15  1235     History   Chief Complaint Chief Complaint  Patient presents with  . Elbow Pain    HPI Xavier Daniels is a 50 y.o. male presenting with an acute flare of his gout.  He describes elbow pain, swelling and redness which is consistent with past flares of his gout, this episode starting yesterday.  He denies fevers, chills or trauma to the elbow and is able to flex and extend the joint but with discomfort. He has applied ice the elbow with transient improvement.  He denies radiation of pain which is constant and aching.   The history is provided by the patient.    Past Medical History:  Diagnosis Date  . Gout   . Gout   . Hypertension   . Substance abuse    ETOH, 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, CLEAN FOR 13 YEARS    Patient Active Problem List   Diagnosis Date Noted  . Gout attack 06/20/2014  . Essential hypertension 06/06/2014  . Hyperlipidemia 06/06/2014  . Gout 06/06/2014  . Obesity 06/06/2014    Past Surgical History:  Procedure Laterality Date  . TOTAL HIP ARTHROPLASTY  2007       Home Medications    Prior to Admission medications   Medication Sig Start Date End Date Taking? Authorizing Provider  allopurinol (ZYLOPRIM) 100 MG tablet TAKE ONE TABLET BY MOUTH ONCE DAILY 11/08/15  Yes Alycia Rossetti, MD  atenolol (TENORMIN) 50 MG tablet Take 1 tablet (50 mg total) by mouth daily. 12/13/15  Yes Alycia Rossetti, MD  ibuprofen (ADVIL,MOTRIN) 200 MG tablet Take 200 mg by mouth every 6 (six) hours as needed.   Yes Historical Provider, MD  ibuprofen (ADVIL,MOTRIN) 800 MG tablet TAKE ONE TABLET BY MOUTH EVERY 8 HOURS AS NEEDED --  **STOP  TAKING  DICLOFENAC** Patient not taking: Reported on 12/15/2015 11/15/15   Alycia Rossetti, MD    Family History Family History  Problem Relation Age of Onset  . Diabetes Mother   . Diabetes Brother     Social History Social History  Substance Use  Topics  . Smoking status: Never Smoker  . Smokeless tobacco: Never Used  . Alcohol use No     Allergies   Review of patient's allergies indicates no known allergies.   Review of Systems Review of Systems  Constitutional: Negative for fever.  Musculoskeletal: Positive for arthralgias and joint swelling. Negative for myalgias.  Neurological: Negative for weakness and numbness.     Physical Exam Updated Vital Signs BP 157/95 (BP Location: Right Arm)   Pulse 82   Temp 97.9 F (36.6 C) (Oral)   Resp 18   Ht 6' (1.829 m)   Wt 116.6 kg   SpO2 99%   BMI 34.86 kg/m   Physical Exam  Constitutional: He appears well-developed and well-nourished.  HENT:  Head: Atraumatic.  Neck: Normal range of motion.  Cardiovascular:  Pulses equal bilaterally  Musculoskeletal: He exhibits edema and tenderness.       Left elbow: He exhibits swelling. He exhibits no effusion and no deformity. Tenderness found. Medial epicondyle, lateral epicondyle and olecranon process tenderness noted.  Edema and erythema.  No red streaking. Skin intact. Pt can flex and extend the joint. No pain with flex/ext of wrist and shoulder. Radial pulse normal.  No axillary adenopathy.  Neurological: He is alert. He has normal strength. He displays normal reflexes. No sensory  deficit.  Skin: Skin is warm and dry.  Psychiatric: He has a normal mood and affect.     ED Treatments / Results  Labs (all labs ordered are listed, but only abnormal results are displayed) Labs Reviewed - No data to display  EKG  EKG Interpretation None       Radiology No results found.  Procedures Procedures (including critical care time)  Medications Ordered in ED Medications  colchicine tablet 1.2 mg (1.2 mg Oral Given 12/15/15 1405)  colchicine tablet 0.6 mg (0.6 mg Oral Given 12/15/15 1405)     Initial Impression / Assessment and Plan / ED Course  I have reviewed the triage vital signs and the nursing notes.  Pertinent  labs & imaging results that were available during my care of the patient were reviewed by me and considered in my medical decision making (see chart for details).  Clinical Course    Exam c/w gout.  Pt can flex and ext the joint, no risk factors for septic joint and sx similar to prior gouty flare.  He was given colchicine 1.2 mg, given an additional 0.6 gm tab to take in one hour. Pt states this tx has relieved flares in the past.  He was ibuprofen 600 mg tbs at home - advised to take q 6 hours until sx resolved.  He has hydrocodone at home as well - prn.  Recheck by pcp if sx persist or worsen.  Final Clinical Impressions(s) / ED Diagnoses   Final diagnoses:  Acute gout of left elbow, unspecified cause    New Prescriptions Discharge Medication List as of 12/15/2015  2:03 PM       Evalee Jefferson, PA-C 12/17/15 [place], MD 12/19/15 [phone]

## 2015-12-26 ENCOUNTER — Telehealth: Payer: Self-pay | Admitting: *Deleted

## 2015-12-26 NOTE — Telephone Encounter (Signed)
Patient called and states he needs a refill of his gout medication. Patient states that he was prescribed this medication awhile back. He can't remember the name of it. Patient pharmacy is [facility]. Patient states you can call him if you have any questions about that prescription. His call back number is  [phone]. Please advise provider. Thank you

## 2015-12-26 NOTE — Telephone Encounter (Signed)
Patient called back and states the medication is Colorys. He states that Dr. Buelah Manis gave him a sample ( about 3 pills) last time. He states it seem to work for him . Thank you

## 2015-12-28 MED ORDER — COLCHICINE 0.6 MG PO TABS: 0.6000 mg | ORAL_TABLET | Freq: Every day | ORAL | 1 refills | Status: DC

## 2015-12-28 MED ORDER — COLCHICINE 0.6 MG PO TABS
0.6000 mg | ORAL_TABLET | Freq: Every day | ORAL | 1 refills | Status: DC
Start: 2015-12-28 — End: 2016-01-26

## 2015-12-28 NOTE — Telephone Encounter (Signed)
Prescription sent to pharmacy. .   Call placed to patient and patient made aware.  

## 2015-12-28 NOTE — Telephone Encounter (Signed)
No samples given, send in script for colchicine 0.6mg  take 1 po daily #30 R 1  If he is having a flare, take 2 tablet x 1 day, then 1 tablet daily for 5 days

## 2015-12-28 NOTE — Telephone Encounter (Signed)
MD please advise

## 2016-01-02 ENCOUNTER — Encounter (HOSPITAL_COMMUNITY): Payer: Self-pay | Admitting: Emergency Medicine

## 2016-01-02 ENCOUNTER — Emergency Department (HOSPITAL_COMMUNITY)
Admission: EM | Admit: 2016-01-02 | Discharge: 2016-01-02 | Disposition: A | Discharge status: Home / Self Care | Payer: BC Managed Care – PPO | Attending: Emergency Medicine | Admitting: Emergency Medicine

## 2016-01-02 DIAGNOSIS — M791 Myalgia: Secondary | ICD-10-CM | POA: Insufficient documentation

## 2016-01-02 DIAGNOSIS — K529 Noninfective gastroenteritis and colitis, unspecified: Secondary | ICD-10-CM | POA: Insufficient documentation

## 2016-01-02 DIAGNOSIS — R112 Nausea with vomiting, unspecified: Secondary | ICD-10-CM | POA: Diagnosis present

## 2016-01-02 DIAGNOSIS — I10 Essential (primary) hypertension: Secondary | ICD-10-CM | POA: Insufficient documentation

## 2016-01-02 LAB — COMPREHENSIVE METABOLIC PANEL
ALT: 32 U/L (ref 17–63)
AST: 27 U/L (ref 15–41)
Albumin: 3.9 g/dL (ref 3.5–5.0)
Alkaline Phosphatase: 137 U/L — ABNORMAL HIGH (ref 38–126)
Anion gap: 10 (ref 5–15)
BUN: 11 mg/dL (ref 6–20)
CHLORIDE: 101 mmol/L (ref 101–111)
CO2: 24 mmol/L (ref 22–32)
Calcium: 9.2 mg/dL (ref 8.9–10.3)
Creatinine, Ser: 1.34 mg/dL — ABNORMAL HIGH (ref 0.61–1.24)
Glucose, Bld: 140 mg/dL — ABNORMAL HIGH (ref 65–99)
POTASSIUM: 3.4 mmol/L — AB (ref 3.5–5.1)
Sodium: 135 mmol/L (ref 135–145)
Total Bilirubin: 1.4 mg/dL — ABNORMAL HIGH (ref 0.3–1.2)
Total Protein: 8.5 g/dL — ABNORMAL HIGH (ref 6.5–8.1)

## 2016-01-02 LAB — URINE MICROSCOPIC-ADD ON
Bacteria, UA: NONE SEEN
WBC UA: NONE SEEN WBC/hpf (ref 0–5)

## 2016-01-02 LAB — URINALYSIS, ROUTINE W REFLEX MICROSCOPIC
GLUCOSE, UA: NEGATIVE mg/dL
Ketones, ur: NEGATIVE mg/dL
LEUKOCYTES UA: NEGATIVE
Nitrite: NEGATIVE
PH: 7 (ref 5.0–8.0)
PROTEIN: 100 mg/dL — AB
Specific Gravity, Urine: 1.02 (ref 1.005–1.030)

## 2016-01-02 LAB — CBC
HEMATOCRIT: 40.8 % (ref 39.0–52.0)
Hemoglobin: 13.6 g/dL (ref 13.0–17.0)
MCH: 29.1 pg (ref 26.0–34.0)
MCHC: 33.3 g/dL (ref 30.0–36.0)
MCV: 87.4 fL (ref 78.0–100.0)
Platelets: 207 10*3/uL (ref 150–400)
RBC: 4.67 MIL/uL (ref 4.22–5.81)
RDW: 13.8 % (ref 11.5–15.5)
WBC: 10.7 10*3/uL — AB (ref 4.0–10.5)

## 2016-01-02 LAB — LIPASE, BLOOD: LIPASE: 13 U/L (ref 11–51)

## 2016-01-02 MED ORDER — IBUPROFEN 600 MG PO TABS: 600.0000 mg | ORAL_TABLET | Freq: Four times a day (QID) | ORAL | 0 refills | Status: DC

## 2016-01-02 MED ORDER — PROMETHAZINE HCL 25 MG PO TABS: 25.0000 mg | ORAL_TABLET | Freq: Four times a day (QID) | ORAL | 0 refills | Status: DC | PRN

## 2016-01-02 MED ORDER — SODIUM CHLORIDE 0.9 % IV SOLN
1000.0000 mL | Freq: Once | INTRAVENOUS | Status: AC
  Administered 2016-01-02: 1000 mL via INTRAVENOUS

## 2016-01-02 MED ORDER — PROCHLORPERAZINE EDISYLATE 5 MG/ML IJ SOLN
5.0000 mg | Freq: Once | INTRAMUSCULAR | Status: AC
  Administered 2016-01-02: 5 mg via INTRAVENOUS
  Filled 2016-01-02: qty 2

## 2016-01-02 MED ORDER — IBUPROFEN 800 MG PO TABS
800.0000 mg | ORAL_TABLET | Freq: Once | ORAL | Status: AC
  Administered 2016-01-02: 800 mg via ORAL
  Filled 2016-01-02: qty 1

## 2016-01-02 MED ORDER — SODIUM CHLORIDE 0.9 % IV SOLN
1000.0000 mL | INTRAVENOUS | Status: DC
  Administered 2016-01-02: 1000 mL via INTRAVENOUS

## 2016-01-02 NOTE — ED Notes (Signed)
EDP at bedside  

## 2016-01-02 NOTE — ED Provider Notes (Signed)
[facility] DEPT Provider Note   CSN: [id number] Arrival date & time: 01/02/16  0901     History   Chief Complaint Chief Complaint  Patient presents with  . Emesis    HPI Xavier Daniels is a 50 y.o. male.  Patient is a 50 year old male who presents to the emergency department with a complaint of nausea vomiting.  The patient states that he started feeling bad about 3 days ago. He noticed a headache. Today he had vomiting. He had fever of 102.2. He's had chills over the last 3 days. There's been no diarrhea. There's been no blood in the vomitus. His been no injury to the abdomen, no recent surgery to the abdomen. He has no previous history of any abdominal related illnesses. 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 He has no known medical conditions that would insult the immune system.      Past Medical History:  Diagnosis Date  . Gout   . Gout   . Hypertension   . Substance abuse    ETOH, 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, CLEAN FOR 13 YEARS    Patient Active Problem List   Diagnosis Date Noted  . Gout attack 06/20/2014  . Essential hypertension 06/06/2014  . Hyperlipidemia 06/06/2014  . Gout 06/06/2014  . Obesity 06/06/2014    Past Surgical History:  Procedure Laterality Date  . TOTAL HIP ARTHROPLASTY  2007       Home Medications    Prior to Admission medications   Medication Sig Start Date End Date Taking? Authorizing Provider  allopurinol (ZYLOPRIM) 100 MG tablet TAKE ONE TABLET BY MOUTH ONCE DAILY 11/08/15  Yes Alycia Rossetti, MD  atenolol (TENORMIN) 50 MG tablet Take 1 tablet (50 mg total) by mouth daily. 12/13/15  Yes Alycia Rossetti, MD  colchicine 0.6 MG tablet Take 1 tablet (0.6 mg total) by mouth daily. 12/28/15  Yes Alycia Rossetti, MD  ibuprofen (ADVIL,MOTRIN) 200 MG tablet Take 200 mg by mouth every 6 (six) hours as needed.   Yes Historical Provider, MD    Family History Family History  Problem Relation Age of Onset  .  Diabetes Mother   . Diabetes Brother     Social History Social History  Substance Use Topics  . Smoking status: Never Smoker  . Smokeless tobacco: Never Used  . Alcohol use No     Allergies   Review of patient's allergies indicates no known allergies.   Review of Systems Review of Systems  Constitutional: Positive for activity change, appetite change, chills and fever.  Gastrointestinal: Positive for nausea and vomiting. Negative for blood in stool, constipation and diarrhea.  Genitourinary: Negative for difficulty urinating and dysuria.  Musculoskeletal: Positive for myalgias.  All other systems reviewed and are negative.    Physical Exam Updated Vital Signs BP 122/78 (BP Location: Left Arm)   Pulse 100   Temp 100.3 F (37.9 C) (Oral)   Resp 18   Ht 6' (1.829 m)   Wt 114.8 kg   SpO2 99%   BMI 34.31 kg/m   Physical Exam  Constitutional: He appears well-developed and well-nourished. No distress.  HENT:  Head: Normocephalic and atraumatic.  Right Ear: External ear normal.  Left Ear: External ear normal.  Eyes: Conjunctivae are normal. Right eye exhibits no discharge. Left eye exhibits no discharge. No scleral icterus.  Neck: Neck supple. No tracheal deviation present.  Cardiovascular: Regular rhythm and intact distal pulses.  Tachycardia present.  Pulmonary/Chest: Effort normal and breath sounds normal. No stridor. No respiratory distress. He has no wheezes. He has no rales.  Abdominal: Soft. Bowel sounds are normal. He exhibits no distension. There is no tenderness. There is no rebound and no guarding.  Musculoskeletal: He exhibits no edema or tenderness.  Neurological: He is alert. He has normal strength. No cranial nerve deficit (no facial droop, extraocular movements intact, no slurred speech) or sensory deficit. He exhibits normal muscle tone. He displays no seizure activity. Coordination normal.  Skin: Skin is warm and dry. No rash noted.  Psychiatric: He  has a normal mood and affect.  Nursing note and vitals reviewed.    ED Treatments / Results  Labs (all labs ordered are listed, but only abnormal results are displayed) Labs Reviewed  COMPREHENSIVE METABOLIC PANEL - Abnormal; Notable for the following:       Result Value   Potassium 3.4 (*)    Glucose, Bld 140 (*)    Creatinine, Ser 1.34 (*)    Total Protein 8.5 (*)    Alkaline Phosphatase 137 (*)    Total Bilirubin 1.4 (*)    All other components within normal limits  CBC - Abnormal; Notable for the following:    WBC 10.7 (*)    All other components within normal limits  URINALYSIS, ROUTINE W REFLEX MICROSCOPIC (NOT AT [facility]) - Abnormal; Notable for the following:    Hgb urine dipstick MODERATE (*)    Bilirubin Urine SMALL (*)    Protein, ur 100 (*)    All other components within normal limits  URINE MICROSCOPIC-ADD ON - Abnormal; Notable for the following:    Squamous Epithelial / LPF 0-5 (*)    All other components within normal limits  LIPASE, BLOOD    EKG  EKG Interpretation None       Radiology No results found.  Procedures Procedures (including critical care time)  Medications Ordered in ED Medications  0.9 %  sodium chloride infusion (not administered)    Followed by  0.9 %  sodium chloride infusion (not administered)  prochlorperazine (COMPAZINE) injection 5 mg (not administered)  ibuprofen (ADVIL,MOTRIN) tablet 800 mg (not administered)     Initial Impression / Assessment and Plan / ED Course  Temperature 100.3, pulse rate 100. The lipase is normal at 13. The competence of metabolic panel shows the potassium to be slightly below the normal 3.4, the glucose is 140, the creatinine is slightly elevated at 1.34, and the total bilirubin is slightly elevated at 1.4. The complete blood count shows a slight elevation of the white blood cells of 10.7, the remainder of the test is within normal limits.  Suspect the patient has a viral illness. Patient will be  treated with IV fluids, IV Compazine, and oral ibuprofen for fever and aching.   Patient states he's feeling much better after IV fluids and anti-medic medications. He is Max Fickle without problem. He has been able to keep some liquids down here in the emergency department.  The plan at this time is for the patient to continue oral hydration at home. Patient will be given a prescription for Zofran for nausea. Patient is given a work note. He is advised to return to the emergency department if not improving. Patient is in agreement with this discharge plan.   I have reviewed the triage vital signs and the nursing notes.  Pertinent labs & imaging results that were available during my care of the patient were reviewed by me and  considered in my medical decision making (see chart for details).  Clinical Course    *I have reviewed nursing notes, vital signs, and all appropriate lab and imaging results for this patient.**  Final Clinical Impressions(s) / ED Diagnoses   Final diagnoses:  None    New Prescriptions New Prescriptions   No medications on file     Lily Kocher, PA-C 01/02/16 [place], MD 01/04/16 1035

## 2016-01-02 NOTE — ED Triage Notes (Signed)
Pt states that he has been vomiting, having chills and states that he has a fever. Pt states that symptoms started Saturday with just abdominal discomfort. Pt is able to drink water and ginger ale.

## 2016-01-06 ENCOUNTER — Encounter (HOSPITAL_COMMUNITY): Payer: Self-pay | Admitting: Emergency Medicine

## 2016-01-06 ENCOUNTER — Emergency Department (HOSPITAL_COMMUNITY)
Admission: EM | Admit: 2016-01-06 | Discharge: 2016-01-06 | Disposition: A | Discharge status: Home / Self Care | Payer: BC Managed Care – PPO | Attending: Emergency Medicine | Admitting: Emergency Medicine

## 2016-01-06 ENCOUNTER — Emergency Department (HOSPITAL_COMMUNITY): Payer: BC Managed Care – PPO

## 2016-01-06 DIAGNOSIS — Z79899 Other long term (current) drug therapy: Secondary | ICD-10-CM | POA: Insufficient documentation

## 2016-01-06 DIAGNOSIS — R509 Fever, unspecified: Secondary | ICD-10-CM | POA: Diagnosis present

## 2016-01-06 DIAGNOSIS — R51 Headache: Secondary | ICD-10-CM | POA: Diagnosis not present

## 2016-01-06 DIAGNOSIS — I10 Essential (primary) hypertension: Secondary | ICD-10-CM | POA: Insufficient documentation

## 2016-01-06 DIAGNOSIS — B349 Viral infection, unspecified: Secondary | ICD-10-CM | POA: Diagnosis not present

## 2016-01-06 LAB — URINALYSIS, ROUTINE W REFLEX MICROSCOPIC
Glucose, UA: 100 mg/dL — AB
KETONES UR: NEGATIVE mg/dL
LEUKOCYTES UA: NEGATIVE
NITRITE: NEGATIVE
Specific Gravity, Urine: 1.02 (ref 1.005–1.030)
pH: 6 (ref 5.0–8.0)

## 2016-01-06 LAB — COMPREHENSIVE METABOLIC PANEL
ALBUMIN: 3.2 g/dL — AB (ref 3.5–5.0)
ALK PHOS: 171 U/L — AB (ref 38–126)
ALT: 50 U/L (ref 17–63)
ANION GAP: 12 (ref 5–15)
AST: 76 U/L — ABNORMAL HIGH (ref 15–41)
BILIRUBIN TOTAL: 2.4 mg/dL — AB (ref 0.3–1.2)
BUN: 13 mg/dL (ref 6–20)
CALCIUM: 8.9 mg/dL (ref 8.9–10.3)
CO2: 22 mmol/L (ref 22–32)
Chloride: 99 mmol/L — ABNORMAL LOW (ref 101–111)
Creatinine, Ser: 1.38 mg/dL — ABNORMAL HIGH (ref 0.61–1.24)
GFR calc non Af Amer: 58 mL/min — ABNORMAL LOW (ref >60)
Glucose, Bld: 126 mg/dL — ABNORMAL HIGH (ref 65–99)
POTASSIUM: 3.4 mmol/L — AB (ref 3.5–5.1)
SODIUM: 133 mmol/L — AB (ref 135–145)
TOTAL PROTEIN: 8.3 g/dL — AB (ref 6.5–8.1)

## 2016-01-06 LAB — CBC WITH DIFFERENTIAL/PLATELET
BASOS PCT: 0 %
Basophils Absolute: 0 10*3/uL (ref 0.0–0.1)
EOS ABS: 0 10*3/uL (ref 0.0–0.7)
Eosinophils Relative: 0 %
HEMATOCRIT: 38.8 % — AB (ref 39.0–52.0)
HEMOGLOBIN: 13.2 g/dL (ref 13.0–17.0)
LYMPHS ABS: 1 10*3/uL (ref 0.7–4.0)
Lymphocytes Relative: 13 %
MCH: 28.6 pg (ref 26.0–34.0)
MCHC: 34 g/dL (ref 30.0–36.0)
MCV: 84.2 fL (ref 78.0–100.0)
MONO ABS: 0.8 10*3/uL (ref 0.1–1.0)
MONOS PCT: 9 %
NEUTROS PCT: 78 %
Neutro Abs: 6.3 10*3/uL (ref 1.7–7.7)
Platelets: 234 10*3/uL (ref 150–400)
RBC: 4.61 MIL/uL (ref 4.22–5.81)
RDW: 13.6 % (ref 11.5–15.5)
WBC: 8.1 10*3/uL (ref 4.0–10.5)

## 2016-01-06 LAB — URINE MICROSCOPIC-ADD ON

## 2016-01-06 MED ORDER — DOXYCYCLINE HYCLATE 100 MG PO TABS
100.0000 mg | ORAL_TABLET | Freq: Once | ORAL | Status: AC
  Administered 2016-01-06: 100 mg via ORAL
  Filled 2016-01-06: qty 1

## 2016-01-06 MED ORDER — TRAMADOL HCL 50 MG PO TABS: 50.0000 mg | ORAL_TABLET | Freq: Four times a day (QID) | ORAL | 0 refills | Status: DC | PRN

## 2016-01-06 MED ORDER — KETOROLAC TROMETHAMINE 30 MG/ML IJ SOLN: 30.0000 mg | Freq: Once | INTRAMUSCULAR | Status: DC

## 2016-01-06 MED ORDER — ACETAMINOPHEN 325 MG PO TABS
650.0000 mg | ORAL_TABLET | Freq: Once | ORAL | Status: AC | PRN
  Administered 2016-01-06: 650 mg via ORAL
  Filled 2016-01-06: qty 2

## 2016-01-06 MED ORDER — KETOROLAC TROMETHAMINE 60 MG/2ML IM SOLN
60.0000 mg | Freq: Once | INTRAMUSCULAR | Status: AC
  Administered 2016-01-06: 60 mg via INTRAMUSCULAR
  Filled 2016-01-06: qty 2

## 2016-01-06 MED ORDER — ACETAMINOPHEN 500 MG PO TABS
1000.0000 mg | ORAL_TABLET | Freq: Once | ORAL | Status: DC
  Filled 2016-01-06: qty 2

## 2016-01-06 MED ORDER — DOXYCYCLINE HYCLATE 100 MG PO CAPS: 100.0000 mg | ORAL_CAPSULE | Freq: Two times a day (BID) | ORAL | 0 refills | Status: DC

## 2016-01-06 NOTE — ED Notes (Signed)
EDP consulted regarding route of toradol. EDP reported no IV access required at this time and to administer Toradol 60mg  IM.

## 2016-01-06 NOTE — Discharge Instructions (Signed)
Follow-up with your family doctor next week for recheck. 

## 2016-01-06 NOTE — ED Notes (Signed)
Pt states he has been mostly in the bed since Tuesday, getting up only to eat and go to the bathroom.

## 2016-01-06 NOTE — ED Triage Notes (Signed)
Pt reports lightheadedness and headache. Pt seen here last Tuesday. Pt states today is the first day he has been out of bed.

## 2016-01-06 NOTE — ED Provider Notes (Signed)
[facility] DEPT Provider Note   CSN: [id number] Arrival date & time: 01/06/16  1532     History   Chief Complaint Chief Complaint  Patient presents with  . Fever    HPI Xavier Daniels is a 50 y.o. male.  Patient complains of a headache and fever no neck pain   The history is provided by the patient.  Fever   This is a new problem. The current episode started more than 2 days ago. The problem occurs hourly. The problem has not changed since onset.His temperature was unmeasured prior to arrival. Associated symptoms include diarrhea. Pertinent negatives include no chest pain, no congestion, no headaches and no cough. He has tried ibuprofen for the symptoms. The treatment provided mild relief.    Past Medical History:  Diagnosis Date  . Gout   . Gout   . Hypertension   . Substance abuse    ETOH, 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, CLEAN FOR 13 YEARS    Patient Active Problem List   Diagnosis Date Noted  . Gout attack 06/20/2014  . Essential hypertension 06/06/2014  . Hyperlipidemia 06/06/2014  . Gout 06/06/2014  . Obesity 06/06/2014    Past Surgical History:  Procedure Laterality Date  . TOTAL HIP ARTHROPLASTY  2007       Home Medications    Prior to Admission medications   Medication Sig Start Date End Date Taking? Authorizing Provider  allopurinol (ZYLOPRIM) 100 MG tablet TAKE ONE TABLET BY MOUTH ONCE DAILY 11/08/15   Alycia Rossetti, MD  atenolol (TENORMIN) 50 MG tablet Take 1 tablet (50 mg total) by mouth daily. 12/13/15   Alycia Rossetti, MD  colchicine 0.6 MG tablet Take 1 tablet (0.6 mg total) by mouth daily. 12/28/15   Alycia Rossetti, MD  doxycycline (VIBRAMYCIN) 100 MG capsule Take 1 capsule (100 mg total) by mouth 2 (two) times daily. One po bid x 7 days 01/06/16   Milton Ferguson, MD  ibuprofen (ADVIL,MOTRIN) 600 MG tablet Take 1 tablet (600 mg total) by mouth 4 (four) times daily. 01/02/16   Lily Kocher, PA-C  promethazine (PHENERGAN) 25 MG tablet Take 1  tablet (25 mg total) by mouth every 6 (six) hours as needed for nausea or vomiting. 01/02/16   Lily Kocher, PA-C  traMADol (ULTRAM) 50 MG tablet Take 1 tablet (50 mg total) by mouth every 6 (six) hours as needed. 01/06/16   Milton Ferguson, MD    Family History Family History  Problem Relation Age of Onset  . Diabetes Mother   . Diabetes Brother     Social History Social History  Substance Use Topics  . Smoking status: Never Smoker  . Smokeless tobacco: Never Used  . Alcohol use No     Allergies   Review of patient's allergies indicates no known allergies.   Review of Systems Review of Systems  Constitutional: Positive for fever. Negative for appetite change and fatigue.  HENT: Negative for congestion, ear discharge and sinus pressure.   Eyes: Negative for discharge.  Respiratory: Negative for cough.   Cardiovascular: Negative for chest pain.  Gastrointestinal: Positive for diarrhea. Negative for abdominal pain.  Genitourinary: Negative for frequency and hematuria.  Musculoskeletal: Negative for back pain.  Skin: Negative for rash.  Neurological: Negative for seizures and headaches.  Psychiatric/Behavioral: Negative for hallucinations.     Physical Exam Updated Vital Signs BP 114/81 (BP Location: Right Arm)   Pulse 64   Temp 97.4 F (36.3 C) (Oral)   Resp 16  Ht 6' (1.829 m)   Wt 253 lb (114.8 kg)   SpO2 98%   BMI 34.31 kg/m   Physical Exam  Constitutional: He is oriented to person, place, and time. He appears well-developed.  HENT:  Head: Normocephalic.  Neck supple nontender  Eyes: Conjunctivae and EOM are normal. No scleral icterus.  Neck: Neck supple. No thyromegaly present.  Cardiovascular: Normal rate and regular rhythm.  Exam reveals no gallop and no friction rub.   No murmur heard. Pulmonary/Chest: No stridor. He has no wheezes. He has no rales. He exhibits no tenderness.  Abdominal: He exhibits no distension. There is no tenderness. There is no  rebound.  Musculoskeletal: Normal range of motion. He exhibits no edema.  Lymphadenopathy:    He has no cervical adenopathy.  Neurological: He is oriented to person, place, and time. He exhibits normal muscle tone. Coordination normal.  Skin: No rash noted. No erythema.  Psychiatric: He has a normal mood and affect. His behavior is normal.     ED Treatments / Results  Labs (all labs ordered are listed, but only abnormal results are displayed) Labs Reviewed  CBC WITH DIFFERENTIAL/PLATELET - Abnormal; Notable for the following:       Result Value   HCT 38.8 (*)    All other components within normal limits  COMPREHENSIVE METABOLIC PANEL - Abnormal; Notable for the following:    Sodium 133 (*)    Potassium 3.4 (*)    Chloride 99 (*)    Glucose, Bld 126 (*)    Creatinine, Ser 1.38 (*)    Total Protein 8.3 (*)    Albumin 3.2 (*)    AST 76 (*)    Alkaline Phosphatase 171 (*)    Total Bilirubin 2.4 (*)    GFR calc non Af Amer 58 (*)    All other components within normal limits  URINALYSIS, ROUTINE W REFLEX MICROSCOPIC (NOT AT [facility]) - Abnormal; Notable for the following:    Color, Urine AMBER (*)    Glucose, UA 100 (*)    Hgb urine dipstick LARGE (*)    Bilirubin Urine MODERATE (*)    Protein, ur >300 (*)    All other components within normal limits  URINE MICROSCOPIC-ADD ON - Abnormal; Notable for the following:    Squamous Epithelial / LPF 6-30 (*)    Bacteria, UA FEW (*)    All other components within normal limits  ROCKY MTN SPOTTED FVR ABS PNL(IGG+IGM)  B. BURGDORFI ANTIBODIES    EKG  EKG Interpretation None       Radiology Ct Head Wo Contrast  Result Date: 01/06/2016 CLINICAL DATA:  Headache, lightheadedness EXAM: CT HEAD WITHOUT CONTRAST TECHNIQUE: Contiguous axial images were obtained from the base of the skull through the vertex without intravenous contrast. COMPARISON:  None. FINDINGS: Brain: No evidence of acute infarction, hemorrhage, hydrocephalus,  extra-axial collection or mass lesion/mass effect. Vascular: No hyperdense vessel or unexpected calcification. Skull: Normal. Negative for fracture or focal lesion. Sinuses/Orbits: No acute finding. Other: Cerebral volume is within normal limits. No ventriculomegaly. IMPRESSION: Normal head CT. Electronically Signed   By: Julian Hy M.D.   On: 01/06/2016 18:16    Procedures Procedures (including critical care time)  Medications Ordered in ED Medications  acetaminophen (TYLENOL) tablet 1,000 mg (1,000 mg Oral Not Given 01/06/16 1845)  acetaminophen (TYLENOL) tablet 650 mg (650 mg Oral Given 01/06/16 1614)  ketorolac (TORADOL) injection 60 mg (60 mg Intramuscular Given 01/06/16 1849)     Initial Impression /  Assessment and Plan / ED Course  I have reviewed the triage vital signs and the nursing notes.  Pertinent labs & imaging results that were available during my care of the patient were reviewed by me and considered in my medical decision making (see chart for details).  Clinical Course    Labs unremarkable CT scan of the head normal.  Patient improved with treatment. Patient put on doxycycline to cover lock mount spotted fever and labs have been sent for mount spotted fever and Lyme's disease he will follow-up with his PCP  Final Clinical Impressions(s) / ED Diagnoses   Final diagnoses:  Viral illness    New Prescriptions New Prescriptions   DOXYCYCLINE (VIBRAMYCIN) 100 MG CAPSULE    Take 1 capsule (100 mg total) by mouth 2 (two) times daily. One po bid x 7 days   TRAMADOL (ULTRAM) 50 MG TABLET    Take 1 tablet (50 mg total) by mouth every 6 (six) hours as needed.     Milton Ferguson, MD 01/06/16 2251

## 2016-01-08 ENCOUNTER — Encounter: Payer: Self-pay | Admitting: Family Medicine

## 2016-01-08 ENCOUNTER — Ambulatory Visit (INDEPENDENT_AMBULATORY_CARE_PROVIDER_SITE_OTHER): Payer: BLUE CROSS/BLUE SHIELD | Admitting: Family Medicine

## 2016-01-08 VITALS — BP 130/74 | HR 82 | Temp 98.2°F | Resp 16 | Ht 73.0 in | Wt 241.0 lb

## 2016-01-08 DIAGNOSIS — N289 Disorder of kidney and ureter, unspecified: Secondary | ICD-10-CM | POA: Diagnosis not present

## 2016-01-08 DIAGNOSIS — I10 Essential (primary) hypertension: Secondary | ICD-10-CM

## 2016-01-08 DIAGNOSIS — B349 Viral infection, unspecified: Secondary | ICD-10-CM | POA: Diagnosis not present

## 2016-01-08 DIAGNOSIS — R3129 Other microscopic hematuria: Secondary | ICD-10-CM

## 2016-01-08 LAB — URINALYSIS, MICROSCOPIC ONLY
Crystals: NONE SEEN [HPF]
Yeast: NONE SEEN [HPF]

## 2016-01-08 LAB — URINALYSIS, ROUTINE W REFLEX MICROSCOPIC
Bilirubin Urine: NEGATIVE
Glucose, UA: NEGATIVE
Ketones, ur: NEGATIVE
Leukocytes, UA: NEGATIVE
Nitrite: NEGATIVE
Specific Gravity, Urine: 1.025 (ref 1.001–1.035)
pH: 6 (ref 5.0–8.0)

## 2016-01-08 LAB — COMPREHENSIVE METABOLIC PANEL
ALT: 61 U/L — ABNORMAL HIGH (ref 9–46)
AST: 56 U/L — ABNORMAL HIGH (ref 10–35)
Albumin: 3.5 g/dL — ABNORMAL LOW (ref 3.6–5.1)
Alkaline Phosphatase: 209 U/L — ABNORMAL HIGH (ref 40–115)
BUN: 15 mg/dL (ref 7–25)
CO2: 24 mmol/L (ref 20–31)
Calcium: 9.3 mg/dL (ref 8.6–10.3)
Chloride: 99 mmol/L (ref 98–110)
Creat: 1.27 mg/dL (ref 0.70–1.33)
Glucose, Bld: 139 mg/dL — ABNORMAL HIGH (ref 70–99)
Potassium: 3.7 mmol/L (ref 3.5–5.3)
Sodium: 137 mmol/L (ref 135–146)
Total Bilirubin: 1.3 mg/dL — ABNORMAL HIGH (ref 0.2–1.2)
Total Protein: 7.3 g/dL (ref 6.1–8.1)

## 2016-01-08 NOTE — Patient Instructions (Signed)
Stop the doxycycline Keep up your fluids We will call with labs GIve note- He can return to work on We 9/27 F/U as previous

## 2016-01-08 NOTE — Progress Notes (Signed)
   Subjective:    Patient ID: Xavier Daniels, male    DOB: 05-19-1965, 50 y.o.   MRN: [id number]  Patient presents for ER F/U (viral gastroenterititis) Here for ER follow-up. He was seen in the emergency room on the 19th secondary to nausea vomiting or chills he aches diagnosed with viral gastroenteritis. He did have urinalysis that showed some hematuria he also had mild hypokalemia and mild elevation in his creatinine to 1.3. He was given antiemetic and IV fluids and then discharged home. Return back to the emergency room on the 23rd with headache and fatigue his vomiting had stopped. He never had any diarrhea no cough or congestion. He had a CT of head which was negative and did not have any history of any tick borne illness however antibiotics for RMSF fever lines are pending and he was started on doxycycline as a precaution he only took 1 dose yesterday. His headache is now resolved no nausea but states he still gets the sweats.     Review Of Systems:  GEN- denies fatigue, fever, weight loss,+weakness, recent illness HEENT- denies eye drainage, change in vision, nasal discharge, CVS- denies chest pain, palpitations RESP- denies SOB, cough, wheeze ABD- denies N/V, change in stools, abd pain GU- denies dysuria, hematuria, dribbling, incontinence MSK- denies joint pain, muscle aches, injury Neuro- denies headache, dizziness, syncope, seizure activity       Objective:    BP 130/74 (BP Location: Right Arm, Patient Position: Sitting, Cuff Size: Large)   Pulse 82   Temp 98.2 F (36.8 C) (Oral)   Resp 16   Ht 6\' 1"  (1.854 m)   Wt 241 lb (109.3 kg)   BMI 31.80 kg/m  GEN- NAD, alert and oriented x3,well appearing  HEENT- PERRL, EOMI, non injected sclera, pink conjunctiva, MMM, oropharynx clear Neck- Supple, no LAD  CVS- RRR, no murmur RESP-CTAB ABD-NABS,soft,NT,ND EXT- No edema Pulses- Radial  2+        Assessment & Plan:      Problem List Items Addressed This Visit    Essential hypertension - Primary    Controlled on atenolol, if not available at pharmacy switch to metoprolol       Other Visit Diagnoses    Microscopic hematuria       Recheck labs no UTI symptoms, no renal stone symptoms does have HTN, culture urine as well, RBC count improved in sample today   Relevant Orders   Urinalysis, Routine w reflex microscopic (not at [facility]) (Completed)   Urine culture   Acute renal insufficiency       Relevant Orders   Comprehensive metabolic panel   Viral illness       Based on history this sounds like viral illness, also had mild elevation in LFT along with ARI. recheck labs, now hydrated. D/C doxy, restart if labs abnormal    Relevant Orders   CBC with Differential/Platelet      Note: This dictation was prepared with Dragon dictation along with smaller phrase technology. Any transcriptional errors that result from this process are unintentional.

## 2016-01-08 NOTE — Assessment & Plan Note (Signed)
Controlled on atenolol, if not available at pharmacy switch to metoprolol

## 2016-01-09 LAB — CBC WITH DIFFERENTIAL/PLATELET
BASOS PCT: 0 %
Basophils Absolute: 0 cells/uL (ref 0–200)
Eosinophils Absolute: 204 cells/uL (ref 15–500)
Eosinophils Relative: 2 %
HEMATOCRIT: 40.6 % (ref 38.5–50.0)
Hemoglobin: 13.8 g/dL (ref 13.0–17.0)
LYMPHS PCT: 19 %
Lymphs Abs: 1938 cells/uL (ref 850–3900)
MCH: 29.1 pg (ref 27.0–33.0)
MCHC: 34 g/dL (ref 32.0–36.0)
MCV: 85.5 fL (ref 80.0–100.0)
MONO ABS: 918 {cells}/uL (ref 200–950)
MONOS PCT: 9 %
MPV: 11 fL (ref 7.5–12.5)
NEUTROS ABS: 7140 {cells}/uL (ref 1500–7800)
Neutrophils Relative %: 70 %
PLATELETS: 288 10*3/uL (ref 140–400)
RBC: 4.75 MIL/uL (ref 4.20–5.80)
RDW: 14.6 % (ref 11.0–15.0)
WBC: 10.2 10*3/uL (ref 3.8–10.8)

## 2016-01-09 LAB — URINE CULTURE: Organism ID, Bacteria: NO GROWTH

## 2016-01-09 LAB — ROCKY MTN SPOTTED FVR ABS PNL(IGG+IGM)
RMSF IgG: POSITIVE — AB
RMSF IgM: 0.24 index (ref 0.00–0.89)

## 2016-01-09 LAB — RMSF, IGG, IFA: RMSF, IGG, IFA: 1:256 {titer} — ABNORMAL HIGH

## 2016-01-09 LAB — B. BURGDORFI ANTIBODIES

## 2016-01-11 ENCOUNTER — Other Ambulatory Visit: Payer: Self-pay | Admitting: *Deleted

## 2016-01-11 DIAGNOSIS — R945 Abnormal results of liver function studies: Secondary | ICD-10-CM

## 2016-01-11 DIAGNOSIS — R3129 Other microscopic hematuria: Secondary | ICD-10-CM

## 2016-01-11 DIAGNOSIS — R7989 Other specified abnormal findings of blood chemistry: Secondary | ICD-10-CM

## 2016-01-12 ENCOUNTER — Other Ambulatory Visit: Payer: BLUE CROSS/BLUE SHIELD

## 2016-01-12 DIAGNOSIS — R7989 Other specified abnormal findings of blood chemistry: Secondary | ICD-10-CM

## 2016-01-12 DIAGNOSIS — R945 Abnormal results of liver function studies: Principal | ICD-10-CM

## 2016-01-12 DIAGNOSIS — R3129 Other microscopic hematuria: Secondary | ICD-10-CM

## 2016-01-12 LAB — COMPREHENSIVE METABOLIC PANEL
ALBUMIN: 3.7 g/dL (ref 3.6–5.1)
ALK PHOS: 158 U/L — AB (ref 40–115)
ALT: 65 U/L — AB (ref 9–46)
AST: 44 U/L — AB (ref 10–35)
BILIRUBIN TOTAL: 0.9 mg/dL (ref 0.2–1.2)
BUN: 12 mg/dL (ref 7–25)
CO2: 26 mmol/L (ref 20–31)
CREATININE: 1.01 mg/dL (ref 0.70–1.33)
Calcium: 9.4 mg/dL (ref 8.6–10.3)
Chloride: 104 mmol/L (ref 98–110)
Glucose, Bld: 137 mg/dL — ABNORMAL HIGH (ref 70–99)
Potassium: 4.5 mmol/L (ref 3.5–5.3)
SODIUM: 139 mmol/L (ref 135–146)
TOTAL PROTEIN: 7.4 g/dL (ref 6.1–8.1)

## 2016-01-12 LAB — URINALYSIS, MICROSCOPIC ONLY
CASTS: NONE SEEN [LPF]
Crystals: NONE SEEN [HPF]
Squamous Epithelial / LPF: NONE SEEN [HPF] (ref <=5)
Yeast: NONE SEEN [HPF]

## 2016-01-12 LAB — URINALYSIS, ROUTINE W REFLEX MICROSCOPIC
Bilirubin Urine: NEGATIVE
Glucose, UA: NEGATIVE
KETONES UR: NEGATIVE
Leukocytes, UA: NEGATIVE
NITRITE: NEGATIVE
PH: 6 (ref 5.0–8.0)
Specific Gravity, Urine: 1.02 (ref 1.001–1.035)

## 2016-01-16 ENCOUNTER — Other Ambulatory Visit: Payer: Self-pay | Admitting: *Deleted

## 2016-01-16 DIAGNOSIS — R945 Abnormal results of liver function studies: Principal | ICD-10-CM

## 2016-01-16 DIAGNOSIS — R7989 Other specified abnormal findings of blood chemistry: Secondary | ICD-10-CM

## 2016-01-26 ENCOUNTER — Other Ambulatory Visit: Payer: Self-pay | Admitting: *Deleted

## 2016-01-26 MED ORDER — COLCHICINE 0.6 MG PO TABS: 0.6000 mg | ORAL_TABLET | Freq: Every day | ORAL | 1 refills | Status: DC

## 2016-01-26 NOTE — Telephone Encounter (Signed)
Received call from patient.   Reports that he requires new refill on Colcrys, but is requesting 90 day supply.   Prescription sent to pharmacy.

## 2016-02-13 ENCOUNTER — Other Ambulatory Visit: Payer: BLUE CROSS/BLUE SHIELD

## 2016-02-13 DIAGNOSIS — R945 Abnormal results of liver function studies: Principal | ICD-10-CM

## 2016-02-13 DIAGNOSIS — R7989 Other specified abnormal findings of blood chemistry: Secondary | ICD-10-CM

## 2016-02-13 LAB — COMPREHENSIVE METABOLIC PANEL
ALK PHOS: 72 U/L (ref 40–115)
ALT: 27 U/L (ref 9–46)
AST: 22 U/L (ref 10–35)
Albumin: 4.1 g/dL (ref 3.6–5.1)
BILIRUBIN TOTAL: 0.8 mg/dL (ref 0.2–1.2)
BUN: 7 mg/dL (ref 7–25)
CO2: 25 mmol/L (ref 20–31)
CREATININE: 1.06 mg/dL (ref 0.70–1.33)
Calcium: 9.5 mg/dL (ref 8.6–10.3)
Chloride: 104 mmol/L (ref 98–110)
Glucose, Bld: 118 mg/dL — ABNORMAL HIGH (ref 70–99)
Potassium: 4.3 mmol/L (ref 3.5–5.3)
SODIUM: 139 mmol/L (ref 135–146)
TOTAL PROTEIN: 7.1 g/dL (ref 6.1–8.1)

## 2016-03-04 ENCOUNTER — Ambulatory Visit: Payer: BLUE CROSS/BLUE SHIELD | Admitting: Family Medicine

## 2016-04-16 ENCOUNTER — Telehealth: Payer: Self-pay | Admitting: *Deleted

## 2016-04-16 MED ORDER — COLCHICINE 0.6 MG PO TABS: 0.6000 mg | ORAL_TABLET | Freq: Every day | ORAL | 1 refills | Status: DC

## 2016-04-16 NOTE — Telephone Encounter (Signed)
Received request from pharmacy for [place] on Colcrys.   PA submitted.   Dx: M10.9- Gout.  Your information has been submitted to [place]. [employer] [place] will review the request and fax you a determination directly, typically within 3 business days of your submission once all necessary information is received. If [employer] [place] has not responded in 3 business days or if you have any questions about your submission, contact [place] at [phone].

## 2016-04-16 NOTE — Telephone Encounter (Signed)
Received call from [place].   Was advised that brand name Colcrys is preferred with no PA.   New prescription sent to pharmacy.

## 2016-04-22 ENCOUNTER — Encounter: Payer: Self-pay | Admitting: Family Medicine

## 2016-04-22 ENCOUNTER — Ambulatory Visit (INDEPENDENT_AMBULATORY_CARE_PROVIDER_SITE_OTHER): Payer: BLUE CROSS/BLUE SHIELD | Admitting: Family Medicine

## 2016-04-22 VITALS — BP 138/84 | HR 70 | Temp 98.3°F | Resp 16 | Ht 73.0 in | Wt 254.0 lb

## 2016-04-22 DIAGNOSIS — E7800 Pure hypercholesterolemia, unspecified: Secondary | ICD-10-CM | POA: Diagnosis not present

## 2016-04-22 DIAGNOSIS — Z6833 Body mass index (BMI) 33.0-33.9, adult: Secondary | ICD-10-CM | POA: Diagnosis not present

## 2016-04-22 DIAGNOSIS — I10 Essential (primary) hypertension: Secondary | ICD-10-CM | POA: Diagnosis not present

## 2016-04-22 DIAGNOSIS — E6609 Other obesity due to excess calories: Secondary | ICD-10-CM | POA: Diagnosis not present

## 2016-04-22 DIAGNOSIS — Z23 Encounter for immunization: Secondary | ICD-10-CM

## 2016-04-22 LAB — LIPID PANEL
CHOL/HDL RATIO: 5.6 ratio — AB (ref <5.0)
CHOLESTEROL: 228 mg/dL — AB (ref <200)
HDL: 41 mg/dL (ref >40)
LDL Cholesterol: 154 mg/dL — ABNORMAL HIGH (ref <100)
TRIGLYCERIDES: 167 mg/dL — AB (ref <150)
VLDL: 33 mg/dL — ABNORMAL HIGH (ref <30)

## 2016-04-22 LAB — CBC WITH DIFFERENTIAL/PLATELET
BASOS PCT: 1 %
Basophils Absolute: 40 cells/uL (ref 0–200)
EOS ABS: 120 {cells}/uL (ref 15–500)
Eosinophils Relative: 3 %
HEMATOCRIT: 42.4 % (ref 38.5–50.0)
HEMOGLOBIN: 13.9 g/dL (ref 13.0–17.0)
LYMPHS ABS: 2160 {cells}/uL (ref 850–3900)
Lymphocytes Relative: 54 %
MCH: 28.8 pg (ref 27.0–33.0)
MCHC: 32.8 g/dL (ref 32.0–36.0)
MCV: 88 fL (ref 80.0–100.0)
MONO ABS: 360 {cells}/uL (ref 200–950)
MPV: 10.2 fL (ref 7.5–12.5)
Monocytes Relative: 9 %
Neutro Abs: 1320 cells/uL — ABNORMAL LOW (ref 1500–7800)
Neutrophils Relative %: 33 %
Platelets: 226 10*3/uL (ref 140–400)
RBC: 4.82 MIL/uL (ref 4.20–5.80)
RDW: 15.1 % — ABNORMAL HIGH (ref 11.0–15.0)
WBC: 4 10*3/uL (ref 3.8–10.8)

## 2016-04-22 LAB — COMPREHENSIVE METABOLIC PANEL
ALBUMIN: 4.3 g/dL (ref 3.6–5.1)
ALT: 17 U/L (ref 9–46)
AST: 16 U/L (ref 10–35)
Alkaline Phosphatase: 61 U/L (ref 40–115)
BILIRUBIN TOTAL: 0.7 mg/dL (ref 0.2–1.2)
BUN: 13 mg/dL (ref 7–25)
CALCIUM: 9.7 mg/dL (ref 8.6–10.3)
CO2: 23 mmol/L (ref 20–31)
Chloride: 104 mmol/L (ref 98–110)
Creat: 1.16 mg/dL (ref 0.70–1.33)
Glucose, Bld: 119 mg/dL — ABNORMAL HIGH (ref 70–99)
POTASSIUM: 4.2 mmol/L (ref 3.5–5.3)
Sodium: 139 mmol/L (ref 135–146)
Total Protein: 7.5 g/dL (ref 6.1–8.1)

## 2016-04-22 MED ORDER — ATENOLOL 50 MG PO TABS: 50.0000 mg | ORAL_TABLET | Freq: Every day | ORAL | 6 refills | Status: DC

## 2016-04-22 NOTE — Assessment & Plan Note (Signed)
Blood pressure looks okay today he will continue the atenolol 50 mg once a day Also discussed his dietary habits restarting his exercise program. We'll check his fasting labs today. He will have a physical in 4 months time Shot was given

## 2016-04-22 NOTE — Progress Notes (Signed)
   Subjective:    Patient ID: Xavier Daniels, male    DOB: [birth year], 51 y.o.   MRN: [id number]  Patient presents for Follow-up (is fasting)  Patient here to follow-up chronic medical problems including his blood pressure and his cholesterol. Cholesterol has been diet controlled He also has history of gout. For his blood pressure he has been on atenolol 50 mg once a day. For gout he has been treated with allopurinol however has not been taking recently, since he does not have any gout flares therefore stopped the medicine a couple months ago.  His history of obesity since our last visit his weight is up 13 pounds since September. He admits to dietary indiscretions indiscretions eating too many carbs bread sweets he is still trying to avoid red meats. He is due for his 6 month labs    Review Of Systems:  GEN- denies fatigue, fever, weight loss,weakness, recent illness HEENT- denies eye drainage, change in vision, nasal discharge, CVS- denies chest pain, palpitations RESP- denies SOB, cough, wheeze ABD- denies N/V, change in stools, abd pain GU- denies dysuria, hematuria, dribbling, incontinence MSK- denies joint pain, muscle aches, injury Neuro- denies headache, dizziness, syncope, seizure activity       Objective:    BP 138/84 (BP Location: Left Arm, Patient Position: Sitting, Cuff Size: Large)   Pulse 70   Temp 98.3 F (36.8 C) (Oral)   Resp 16   Ht 6\' 1"  (1.854 m)   Wt 254 lb (115.2 kg)   SpO2 98%   BMI 33.51 kg/m  GEN- NAD, alert and oriented x3 HEENT- PERRL, EOMI, non injected sclera, pink conjunctiva, MMM, oropharynx clear CVS- RRR, no murmur RESP-CTAB EXT- No edema Pulses- Radial  2+        Assessment & Plan:      Problem List Items Addressed This Visit    Obesity   Hyperlipidemia - Primary   Relevant Medications   atenolol (TENORMIN) 50 MG tablet   Other Relevant Orders   Lipid panel   Essential hypertension    Blood pressure looks okay today he  will continue the atenolol 50 mg once a day Also discussed his dietary habits restarting his exercise program. We'll check his fasting labs today. He will have a physical in 4 months time Shot was given      Relevant Medications   atenolol (TENORMIN) 50 MG tablet   Other Relevant Orders   CBC with Differential/Platelet   Comprehensive metabolic panel      Note: This dictation was prepared with Dragon dictation along with smaller phrase technology. Any transcriptional errors that result from this process are unintentional.

## 2016-04-22 NOTE — Addendum Note (Signed)
Addended by: Sheral Flow on: 04/22/2016 08:55 AM   Modules accepted: Orders

## 2016-04-22 NOTE — Patient Instructions (Signed)
F/U 4 months for Physical  

## 2016-04-25 ENCOUNTER — Other Ambulatory Visit: Payer: Self-pay | Admitting: *Deleted

## 2016-04-25 MED ORDER — ATENOLOL 100 MG PO TABS: 50.0000 mg | ORAL_TABLET | Freq: Every day | ORAL | 1 refills | Status: DC

## 2016-04-25 NOTE — Telephone Encounter (Signed)
Received fax requesting change of medication.   Reports that Atenolol 50mg  I not available.   Reports Atenolol 100mg  and Atenolol 25mg  available.   New prescription sent.

## 2016-05-03 ENCOUNTER — Telehealth: Payer: Self-pay | Admitting: Family Medicine

## 2016-05-03 ENCOUNTER — Other Ambulatory Visit: Payer: Self-pay | Admitting: *Deleted

## 2016-05-03 MED ORDER — COLCHICINE 0.6 MG PO CAPS: 1.0000 | ORAL_CAPSULE | Freq: Every day | ORAL | 11 refills | Status: DC

## 2016-05-03 NOTE — Telephone Encounter (Signed)
Pharmacy needs okay from dr. Buelah Manis for medication switch. (pt did not know name of med but states its for gout.)

## 2016-05-03 NOTE — Telephone Encounter (Signed)
Call placed to pharmacy.   Reports that colchicine caps are cheaper than tabs and patient is requesting to change.   Prescription sent to pharmacy.

## 2016-05-03 NOTE — Telephone Encounter (Signed)
[facility] pharmacy calling regarding this patients prescription [phone]

## 2016-05-06 ENCOUNTER — Telehealth: Payer: Self-pay | Admitting: *Deleted

## 2016-05-06 NOTE — Telephone Encounter (Signed)
Your information has been submitted to [employer] [place]. [employer] [place] will review the request and fax you a determination directly, typically within 3 business days of your submission once all necessary information is received.  If [employer] [place] has not responded in 3 business days or if you have any questions about your submission, contact [employer] [place] at [phone]. 

## 2016-05-06 NOTE — Telephone Encounter (Signed)
Received request from pharmacy for [place] on [place].   PA submitted.   Dx: Gout

## 2016-05-06 NOTE — Telephone Encounter (Signed)
Received call from pharmacy.   States that patient is currently taking Colcrys for gout. States that co-pay is >$30.   Reports that they have savings card for Mitigare (colchicine caps) that will decrease co-pay to $5.   Prescription sent to pharmacy.

## 2016-05-07 NOTE — Telephone Encounter (Signed)
Received PA determination.   PA approved 05/06/2016- 04/14/2018.  Reference #: [id number].  Pharmacy made aware.

## 2016-05-13 ENCOUNTER — Other Ambulatory Visit: Payer: Self-pay | Admitting: Family Medicine

## 2016-05-27 ENCOUNTER — Encounter: Payer: Self-pay | Admitting: Family Medicine

## 2016-06-07 ENCOUNTER — Emergency Department (HOSPITAL_COMMUNITY)
Admission: EM | Admit: 2016-06-07 | Discharge: 2016-06-07 | Disposition: A | Discharge status: Home / Self Care | Payer: No Typology Code available for payment source | Attending: Emergency Medicine | Admitting: Emergency Medicine

## 2016-06-07 ENCOUNTER — Encounter (HOSPITAL_COMMUNITY): Payer: Self-pay | Admitting: Emergency Medicine

## 2016-06-07 DIAGNOSIS — Z79899 Other long term (current) drug therapy: Secondary | ICD-10-CM | POA: Insufficient documentation

## 2016-06-07 DIAGNOSIS — R42 Dizziness and giddiness: Secondary | ICD-10-CM | POA: Diagnosis present

## 2016-06-07 DIAGNOSIS — I160 Hypertensive urgency: Secondary | ICD-10-CM | POA: Diagnosis not present

## 2016-06-07 LAB — COMPREHENSIVE METABOLIC PANEL
ALT: 19 U/L (ref 17–63)
AST: 19 U/L (ref 15–41)
Albumin: 4.1 g/dL (ref 3.5–5.0)
Alkaline Phosphatase: 68 U/L (ref 38–126)
Anion gap: 8 (ref 5–15)
BILIRUBIN TOTAL: 0.6 mg/dL (ref 0.3–1.2)
BUN: 12 mg/dL (ref 6–20)
CALCIUM: 9.2 mg/dL (ref 8.9–10.3)
CO2: 25 mmol/L (ref 22–32)
Chloride: 103 mmol/L (ref 101–111)
Creatinine, Ser: 1.13 mg/dL (ref 0.61–1.24)
Glucose, Bld: 99 mg/dL (ref 65–99)
POTASSIUM: 3.7 mmol/L (ref 3.5–5.1)
Sodium: 136 mmol/L (ref 135–145)
TOTAL PROTEIN: 7.5 g/dL (ref 6.5–8.1)

## 2016-06-07 LAB — URINALYSIS, ROUTINE W REFLEX MICROSCOPIC
Bilirubin Urine: NEGATIVE
GLUCOSE, UA: NEGATIVE mg/dL
Hgb urine dipstick: NEGATIVE
Ketones, ur: NEGATIVE mg/dL
LEUKOCYTES UA: NEGATIVE
NITRITE: NEGATIVE
PH: 7 (ref 5.0–8.0)
PROTEIN: NEGATIVE mg/dL
Specific Gravity, Urine: 1.009 (ref 1.005–1.030)

## 2016-06-07 LAB — CBC WITH DIFFERENTIAL/PLATELET
BASOS ABS: 0 10*3/uL (ref 0.0–0.1)
BASOS PCT: 0 %
Eosinophils Absolute: 0.1 10*3/uL (ref 0.0–0.7)
Eosinophils Relative: 2 %
HEMATOCRIT: 41 % (ref 39.0–52.0)
Hemoglobin: 14 g/dL (ref 13.0–17.0)
LYMPHS PCT: 44 %
Lymphs Abs: 2.2 10*3/uL (ref 0.7–4.0)
MCH: 29 pg (ref 26.0–34.0)
MCHC: 34.1 g/dL (ref 30.0–36.0)
MCV: 85.1 fL (ref 78.0–100.0)
Monocytes Absolute: 0.3 10*3/uL (ref 0.1–1.0)
Monocytes Relative: 7 %
Neutro Abs: 2.3 10*3/uL (ref 1.7–7.7)
Neutrophils Relative %: 47 %
PLATELETS: 212 10*3/uL (ref 150–400)
RBC: 4.82 MIL/uL (ref 4.22–5.81)
RDW: 13.5 % (ref 11.5–15.5)
WBC: 4.9 10*3/uL (ref 4.0–10.5)

## 2016-06-07 NOTE — ED Provider Notes (Signed)
[facility] DEPT Provider Note   CSN: [id number] Arrival date & time: 06/07/16  1333     History   Chief Complaint Chief Complaint  Patient presents with  . Hypertension    HPI Xavier Daniels is a 51 y.o. male.  HPI  Patient presents with concern of lightheadedness and unsteadiness, one episode of vomiting. Patient was generally well until 3 days ago. About that time the patient noticed mild lightheadedness. Subsequently he took his blood pressure several times, found elevated results. Patient went to a clinic, was advised to drink plenty of fluids, monitor his condition. Patient notes one episode of vomiting after drinking substantial amounts of water. Otherwise, no ongoing nausea, vomiting, no diarrhea, no incontinence. No weakness in either upper extremity, no numbness tingling anywhere, no chest pain. Now, the patient states he feels generally better, without clear intervention. No recent medication changes, diet changes, activity changes. Patient noted a history of hypertension, typically controlled with labetalol.   Past Medical History:  Diagnosis Date  . Gout   . Gout   . Hypertension   . Substance abuse    ETOH, 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, CLEAN FOR 13 YEARS    Patient Active Problem List   Diagnosis Date Noted  . Gout attack 06/20/2014  . Essential hypertension 06/06/2014  . Hyperlipidemia 06/06/2014  . Gout 06/06/2014  . Obesity 06/06/2014    Past Surgical History:  Procedure Laterality Date  . TOTAL HIP ARTHROPLASTY  2007       Home Medications    Prior to Admission medications   Medication Sig Start Date End Date Taking? Authorizing Provider  allopurinol (ZYLOPRIM) 100 MG tablet TAKE ONE TABLET BY MOUTH ONCE DAILY 11/08/15  Yes Alycia Rossetti, MD  atenolol (TENORMIN) 50 MG tablet TAKE ONE TABLET BY MOUTH ONCE DAILY 05/13/16  Yes Alycia Rossetti, MD  Colchicine (MITIGARE) 0.6 MG CAPS Take 1 capsule by mouth daily. Patient taking differently:  Take 1 capsule by mouth as needed.  05/03/16  Yes Alycia Rossetti, MD  ibuprofen (ADVIL,MOTRIN) 600 MG tablet Take 1 tablet (600 mg total) by mouth 4 (four) times daily. Patient taking differently: Take 600 mg by mouth as needed for mild pain.  01/02/16  Yes Lily Kocher, PA-C  atenolol (TENORMIN) 100 MG tablet Take 0.5 tablets (50 mg total) by mouth daily. Patient not taking: Reported on 06/07/2016 04/25/16   Alycia Rossetti, MD    Family History Family History  Problem Relation Age of Onset  . Diabetes Mother   . Diabetes Brother     Social History Social History  Substance Use Topics  . Smoking status: Never Smoker  . Smokeless tobacco: Never Used  . Alcohol use No     Allergies   Patient has no known allergies.   Review of Systems Review of Systems  Constitutional:       Per HPI, otherwise negative  HENT:       Per HPI, otherwise negative  Respiratory:       Per HPI, otherwise negative  Cardiovascular:       Per HPI, otherwise negative  Gastrointestinal: Positive for nausea and vomiting. Negative for abdominal pain.  Endocrine:       Negative aside from HPI  Genitourinary:       Neg aside from HPI   Musculoskeletal:       Per HPI, otherwise negative  Skin: Negative.   Neurological: Positive for light-headedness. Negative for syncope.     Physical Exam Updated  Vital Signs BP 138/96 (BP Location: Left Arm)   Pulse 73   Temp 97.7 F (36.5 C) (Oral)   Resp 18   Ht 6\' 1"  (1.854 m)   Wt 250 lb (113.4 kg)   SpO2 99%   BMI 32.98 kg/m   Physical Exam  Constitutional: He is oriented to person, place, and time. He appears well-developed. No distress.  HENT:  Head: Normocephalic and atraumatic.  Eyes: Conjunctivae and EOM are normal.  Cardiovascular: Normal rate, regular rhythm and intact distal pulses.   Symmetric bilateral upper extremity pulses  Pulmonary/Chest: Effort normal. No stridor. No respiratory distress.  Abdominal: He exhibits no distension.  There is no tenderness.  Musculoskeletal: He exhibits no edema.  Neurological: He is alert and oriented to person, place, and time. No cranial nerve deficit or sensory deficit. He exhibits normal muscle tone. Coordination normal.  Skin: Skin is warm and dry.  Psychiatric: He has a normal mood and affect.  Nursing note and vitals reviewed.    ED Treatments / Results  Labs (all labs ordered are listed, but only abnormal results are displayed) Labs Reviewed  COMPREHENSIVE METABOLIC PANEL  CBC WITH DIFFERENTIAL/PLATELET  URINALYSIS, ROUTINE W REFLEX MICROSCOPIC    EKG  EKG Interpretation  Date/Time:  Friday June 07 2016 14:29:44 EST Ventricular Rate:  72 PR Interval:    QRS Duration: 113 QT Interval:  398 QTC Calculation: 436 R Axis:   50 Text Interpretation:  Sinus rhythm Borderline intraventricular conduction delay Low voltage, precordial leads ST-t wave abnormality No significant change since last tracing Abnormal ekg Confirmed by Carmin Muskrat  MD [phone]) on 06/07/2016 2:48:47 PM      Procedures Procedures (including critical care time)   Initial Impression / Assessment and Plan / ED Course  I have reviewed the triage vital signs and the nursing notes.  Pertinent labs & imaging results that were available during my care of the patient were reviewed by me and considered in my medical decision making (see chart for details).  4:06 PM Repeat exam the patient is in no distress. We discussed all findings including reassuring labs, EKG per Vital signs no unremarkable, with diastolic blood pressure 90. We discussed the importance of following up with his primary care physician given today's presentation for hypertensive urgency. With no evidence for end organ effects, no neurologic compromise, the patient is appropriate for discharge with close outpatient follow-up, consideration of medication adjustments.   Final Clinical Impressions(s) / ED Diagnoses  Hypertensive  urgency   Carmin Muskrat, MD 06/07/16 1744

## 2016-06-07 NOTE — Discharge Instructions (Signed)
As discussed, your evaluation today has been largely reassuring.  But, it is important that you monitor your condition carefully, and do not hesitate to return to the ED if you develop new, or concerning changes in your condition. ? ?Otherwise, please follow-up with your physician for appropriate ongoing care. ? ?

## 2016-06-07 NOTE — ED Triage Notes (Signed)
PT states he has had HTN readings 196/86 at home over the past 2 days. PT states he has had no changes in his HTN medications. PT also states for the past 2 days when his blood pressure is high he has had dizziness but denies any at this time.

## 2016-06-10 ENCOUNTER — Ambulatory Visit (INDEPENDENT_AMBULATORY_CARE_PROVIDER_SITE_OTHER): Payer: BLUE CROSS/BLUE SHIELD | Admitting: Family Medicine

## 2016-06-10 ENCOUNTER — Encounter: Payer: Self-pay | Admitting: Family Medicine

## 2016-06-10 VITALS — BP 140/100 | HR 76 | Temp 98.7°F | Resp 18 | Ht 73.0 in | Wt 246.0 lb

## 2016-06-10 DIAGNOSIS — I10 Essential (primary) hypertension: Secondary | ICD-10-CM | POA: Diagnosis not present

## 2016-06-10 MED ORDER — ATENOLOL 100 MG PO TABS: 100.0000 mg | ORAL_TABLET | Freq: Every day | ORAL | 3 refills | Status: DC

## 2016-06-10 NOTE — Progress Notes (Signed)
   Subjective:    Patient ID: Xavier Daniels, male    DOB: 01/06/1966, 51 y.o.   MRN: [id number]  Patient presents for BP Issues (x6 days- states that he had some dizziness on Wed- checked BP and noted high reading )  Pt here to f/u blood pressure, last seen Jan 2018, BP was 138/84, continued on atenolol. Seen in ED on 06/07/16 with elevated blood pressure , in ED BP was 138/96 EKG was unremarkable,  CMET, CBC normal ,UA neg   Weight down 4lbs   Prior to ED felt dizzy, had wrist cuff that read 190's/ 90's, went to [employer] was also high 190/90. Still had mild dizziness on Friday and went to ER    Review Of Systems:  GEN- denies fatigue, fever, weight loss,weakness, recent illness HEENT- denies eye drainage, change in vision, nasal discharge, CVS- denies chest pain, palpitations RESP- denies SOB, cough, wheeze ABD- denies N/V, change in stools, abd pain GU- denies dysuria, hematuria, dribbling, incontinence MSK- denies joint pain, muscle aches, injury Neuro- denies headache,+ dizziness, syncope, seizure activity       Objective:    BP (!) 140/100   Pulse 76   Temp 98.7 F (37.1 C) (Oral)   Resp 18   Ht 6\' 1"  (1.854 m)   Wt 246 lb (111.6 kg)   SpO2 98%   BMI 32.46 kg/m  GEN- NAD, alert and oriented x3 HEENT- PERRL, EOMI, non injected sclera, pink conjunctiva, MMM, oropharynx clear CVS- RRR, no murmur RESP-CTAB Neuro- CNII-XII in tact, no deficits  EXT- No edema Pulses- Radial 2+        Assessment & Plan:      Problem List Items Addressed This Visit    Essential hypertension - Primary    Increase atenolol to 100mg  once a day Given script for large BP cuff F/U March 8th as scheduled Labs normal 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 Any transcriptional errors  that result from this process are unintentional.

## 2016-06-10 NOTE — Assessment & Plan Note (Addendum)
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

## 2016-06-10 NOTE — Patient Instructions (Addendum)
F/U 2 weeks

## 2016-06-24 ENCOUNTER — Ambulatory Visit (INDEPENDENT_AMBULATORY_CARE_PROVIDER_SITE_OTHER): Payer: BLUE CROSS/BLUE SHIELD | Admitting: Family Medicine

## 2016-06-24 ENCOUNTER — Encounter: Payer: Self-pay | Admitting: Family Medicine

## 2016-06-24 VITALS — BP 136/82 | HR 88 | Temp 98.1°F | Resp 16 | Ht 73.0 in | Wt 250.0 lb

## 2016-06-24 DIAGNOSIS — M10071 Idiopathic gout, right ankle and foot: Secondary | ICD-10-CM

## 2016-06-24 DIAGNOSIS — I10 Essential (primary) hypertension: Secondary | ICD-10-CM

## 2016-06-24 MED ORDER — COLCHICINE 0.6 MG PO CAPS: 1.0000 | ORAL_CAPSULE | ORAL | 1 refills | Status: DC | PRN

## 2016-06-24 NOTE — Assessment & Plan Note (Signed)
Continue colchicine for another few days. Discussed the many return to the allopurinol but he does not want to at this time. He does get another flare recommended he restart allopurinol at 100 mg once a day which he does have at home.

## 2016-06-24 NOTE — Assessment & Plan Note (Signed)
Blood pressure is improved we'll continue the atenolol 100 mg

## 2016-06-24 NOTE — Patient Instructions (Addendum)
F/U in May

## 2016-06-24 NOTE — Progress Notes (Signed)
   Subjective:    Patient ID: Xavier Daniels, male    DOB: 18-Oct-1965, 51 y.o.   MRN: [id number]  Patient presents for F/U (is not fasting) and Gout (foot pain/ swelling with gout- states that it feels better today) Patient to follow-up blood pressure. He was seen 2 weeks ago that time a increase his atenolol to 100 mg once a day. He has not been able to check at home. His dizzy spells. Finally resolved. He did have a flare of his gout in his right foot with swelling and pain on the lateral aspect and into the fifth digit. He has been taking cultures in which he started yesterday and his pain is already improved. He is using crutches to get around.    Review Of Systems:  GEN- denies fatigue, fever, weight loss,weakness, recent illness HEENT- denies eye drainage, change in vision, nasal discharge, CVS- denies chest pain, palpitations RESP- denies SOB, cough, wheeze ABD- denies N/V, change in stools, abd pain GU- denies dysuria, hematuria, dribbling, incontinence MSK- + joint pain, denies  muscle aches, injury Neuro- denies headache, dizziness, syncope, seizure activity       Objective:    BP 136/82   Pulse 88   Temp 98.1 F (36.7 C) (Oral)   Resp 16   Ht 6\' 1"  (1.854 m)   Wt 250 lb (113.4 kg)   SpO2 99%   BMI 32.98 kg/m  GEN- NAD, alert and oriented x3 CVS- RRR, no murmur RESP-CTAB EXT- Swelling of right foot, lateral aspect and mid foot, mild TTP, no erythema  Pulses- Radial, DP- 2+        Assessment & Plan:      Problem List Items Addressed This Visit    Gout attack - Primary    Continue colchicine for another few days. Discussed the many return to the allopurinol but he does not want to at this time. He does get another flare recommended he restart allopurinol at 100 mg once a day which he does have at home.      Essential hypertension    Blood pressure is improved we'll continue the atenolol 100 mg         Note: This dictation was prepared with Dragon  dictation along with smaller phrase technology. Any transcriptional errors that result from this process are unintentional.

## 2016-06-25 ENCOUNTER — Other Ambulatory Visit: Payer: Self-pay | Admitting: *Deleted

## 2016-06-25 MED ORDER — COLCHICINE 0.6 MG PO CAPS: 1.0000 | ORAL_CAPSULE | Freq: Every day | ORAL | 1 refills | Status: DC | PRN

## 2016-07-19 ENCOUNTER — Other Ambulatory Visit: Payer: Self-pay | Admitting: Family Medicine

## 2016-07-19 NOTE — Telephone Encounter (Signed)
Medication refilled per protocol. 

## 2016-07-22 ENCOUNTER — Telehealth: Payer: Self-pay | Admitting: *Deleted

## 2016-07-22 NOTE — Telephone Encounter (Signed)
Received call from patient.   Reports that he has had a flare of gout in foot, and is requesting prescription for Indocin. States that he was on it before and he thinks that it works for him.  MD please advise.

## 2016-07-22 NOTE — Telephone Encounter (Signed)
Okay to send in Indocin 50mg  TID prn pain #30  Take with food

## 2016-07-23 MED ORDER — INDOMETHACIN 50 MG PO CAPS: 50.0000 mg | ORAL_CAPSULE | Freq: Three times a day (TID) | ORAL | 1 refills | Status: DC | PRN

## 2016-07-23 NOTE — Addendum Note (Signed)
Addended by: Sheral Flow on: 07/23/2016 11:22 AM   Modules accepted: Orders

## 2016-07-23 NOTE — Telephone Encounter (Signed)
Call placed to patient and patient made aware.   Prescription sent to pharmacy.  

## 2016-08-13 ENCOUNTER — Encounter: Payer: Self-pay | Admitting: Family Medicine

## 2016-08-20 ENCOUNTER — Encounter: Payer: BLUE CROSS/BLUE SHIELD | Admitting: Family Medicine

## 2016-10-07 ENCOUNTER — Encounter: Payer: Self-pay | Admitting: Family Medicine

## 2016-10-08 ENCOUNTER — Other Ambulatory Visit: Payer: Self-pay | Admitting: Family Medicine

## 2016-10-28 ENCOUNTER — Telehealth: Payer: Self-pay | Admitting: Family Medicine

## 2016-10-28 NOTE — Telephone Encounter (Signed)
Pt wants refill on hydrocodone, states he is having gout flare up.

## 2016-10-28 NOTE — Telephone Encounter (Signed)
He should have a refill on the indocin already But no refill on narcotics until bill paid

## 2016-10-28 NOTE — Telephone Encounter (Signed)
Chart review shows no prescriptions for Hydrocodone from PCP. Oxycodone was given in 2017. Tramadol given in 2018 from ED.   MD please advise.   Of note,patietn has been discharged from [facility] due to not meeting financial obligations per Dr. Buelah Manis. May return to practice once account is paid in full. [facility] until 11/10/16. ($1504.00)

## 2016-10-29 NOTE — Telephone Encounter (Signed)
Patient returned call and made aware.

## 2016-10-29 NOTE — Telephone Encounter (Signed)
Call placed to patient. LMTRC.  

## 2016-11-04 ENCOUNTER — Encounter: Payer: BLUE CROSS/BLUE SHIELD | Admitting: Family Medicine

## 2016-11-17 ENCOUNTER — Encounter (HOSPITAL_COMMUNITY): Payer: Self-pay | Admitting: *Deleted

## 2016-11-17 ENCOUNTER — Emergency Department (HOSPITAL_COMMUNITY)
Admission: EM | Admit: 2016-11-17 | Discharge: 2016-11-17 | Disposition: A | Discharge status: Home / Self Care | Payer: BLUE CROSS/BLUE SHIELD | Attending: Emergency Medicine | Admitting: Emergency Medicine

## 2016-11-17 DIAGNOSIS — I10 Essential (primary) hypertension: Secondary | ICD-10-CM | POA: Diagnosis not present

## 2016-11-17 DIAGNOSIS — M5441 Lumbago with sciatica, right side: Secondary | ICD-10-CM | POA: Insufficient documentation

## 2016-11-17 DIAGNOSIS — M5442 Lumbago with sciatica, left side: Secondary | ICD-10-CM | POA: Diagnosis not present

## 2016-11-17 DIAGNOSIS — Z79899 Other long term (current) drug therapy: Secondary | ICD-10-CM | POA: Insufficient documentation

## 2016-11-17 DIAGNOSIS — M545 Low back pain: Secondary | ICD-10-CM | POA: Diagnosis present

## 2016-11-17 MED ORDER — HYDROCODONE-ACETAMINOPHEN 5-325 MG PO TABS: 2.0000 | ORAL_TABLET | ORAL | 0 refills | Status: DC | PRN

## 2016-11-17 MED ORDER — KETOROLAC TROMETHAMINE 60 MG/2ML IM SOLN
60.0000 mg | Freq: Once | INTRAMUSCULAR | Status: AC
  Administered 2016-11-17: 60 mg via INTRAMUSCULAR
  Filled 2016-11-17: qty 2

## 2016-11-17 MED ORDER — HYDROMORPHONE HCL 1 MG/ML IJ SOLN
1.0000 mg | Freq: Once | INTRAMUSCULAR | Status: AC
  Administered 2016-11-17: 1 mg via INTRAMUSCULAR
  Filled 2016-11-17: qty 1

## 2016-11-17 MED ORDER — DICLOFENAC SODIUM 50 MG PO TBEC: 50.0000 mg | DELAYED_RELEASE_TABLET | Freq: Two times a day (BID) | ORAL | 0 refills | Status: DC

## 2016-11-17 MED ORDER — METHYLPREDNISOLONE SODIUM SUCC 125 MG IJ SOLR
125.0000 mg | Freq: Once | INTRAMUSCULAR | Status: AC
  Administered 2016-11-17: 125 mg via INTRAMUSCULAR
  Filled 2016-11-17: qty 2

## 2016-11-17 MED ORDER — METHOCARBAMOL 500 MG PO TABS: 500.0000 mg | ORAL_TABLET | Freq: Two times a day (BID) | ORAL | 0 refills | Status: DC

## 2016-11-17 NOTE — Discharge Instructions (Signed)
See your Physician for recheck in 3-4 days °

## 2016-11-17 NOTE — ED Provider Notes (Signed)
[facility] DEPT Provider Note   CSN: [id number] Arrival date & time: 11/17/16  1632     History   Chief Complaint Chief Complaint  Patient presents with  . Back Pain    HPI Xavier Daniels is a 51 y.o. male.  The history is provided by the patient. No language interpreter was used.  Back Pain   This is a new problem. The current episode started more than 2 days ago. The problem occurs constantly. The problem has not changed since onset.The pain is associated with no known injury. The pain is present in the lumbar spine. The pain is moderate. The symptoms are aggravated by twisting. The pain is the same all the time. He has tried nothing for the symptoms. The treatment provided no relief.  Pt drives a truck.  Pt complains of pain in bilat lower low back.  Pt has pain in his back, hurts to move.  Past Medical History:  Diagnosis Date  . Gout   . Gout   . Hypertension   . Substance abuse    ETOH, 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, CLEAN FOR 13 YEARS    Patient Active Problem List   Diagnosis Date Noted  . Gout attack 06/20/2014  . Essential hypertension 06/06/2014  . Hyperlipidemia 06/06/2014  . Gout 06/06/2014  . Obesity 06/06/2014    Past Surgical History:  Procedure Laterality Date  . TOTAL HIP ARTHROPLASTY  2007       Home Medications    Prior to Admission medications   Medication Sig Start Date End Date Taking? Authorizing Provider  atenolol (TENORMIN) 100 MG tablet TAKE ONE (1) TABLET BY MOUTH EVERY DAY 10/08/16   [place], Modena Nunnery, MD  Colchicine (MITIGARE) 0.6 MG CAPS Take 1 capsule by mouth daily as needed. 06/25/16   [place], Modena Nunnery, MD  diclofenac (VOLTAREN) 50 MG EC tablet Take 1 tablet (50 mg total) by mouth 2 (two) times daily. 11/17/16   Fransico Meadow, PA-C  HYDROcodone-acetaminophen (NORCO/VICODIN) 5-325 MG tablet Take 2 tablets by mouth every 4 (four) hours as needed. 11/17/16   Fransico Meadow, PA-C  ibuprofen (ADVIL,MOTRIN) 800 MG tablet TAKE ONE TABLET BY  MOUTH EVERY 8 HOURS AS NEEDED --  **STOP  TAKING  DICLOFENAC** 07/19/16   [place], Modena Nunnery, MD  indomethacin (INDOCIN) 50 MG capsule Take 1 capsule (50 mg total) by mouth 3 (three) times daily as needed. 07/23/16   [place], Modena Nunnery, MD  methocarbamol (ROBAXIN) 500 MG tablet Take 1 tablet (500 mg total) by mouth 2 (two) times daily. 11/17/16   Fransico Meadow, PA-C    Family History Family History  Problem Relation Age of Onset  . Diabetes Mother   . Diabetes Brother     Social History Social History  Substance Use Topics  . Smoking status: Never Smoker  . Smokeless tobacco: Never Used  . Alcohol use No     Allergies   Patient has no known allergies.   Review of Systems Review of Systems  Musculoskeletal: Positive for back pain.  All other systems reviewed and are negative.    Physical Exam Updated Vital Signs BP (!) 141/90   Pulse 73   Temp (!) 97.5 F (36.4 C) (Oral)   Resp 16   Ht 6\' 1"  (1.854 m)   Wt 110.7 kg (244 lb)   SpO2 99%   BMI 32.19 kg/m   Physical Exam  Constitutional: He appears well-developed and well-nourished.  HENT:  Head: Normocephalic and atraumatic.  Cardiovascular: Normal rate.   Pulmonary/Chest: Effort normal.  Abdominal: Soft.  Musculoskeletal: He exhibits no tenderness.  Decreased range of motion,  Ns and nv intact,  Pain with range of motion  Neurological: He is alert.  Skin: Skin is warm.  Psychiatric: He has a normal mood and affect.  Nursing note and vitals reviewed.    ED Treatments / Results  Labs (all labs ordered are listed, but only abnormal results are displayed) Labs Reviewed - No data to display  EKG  EKG Interpretation None       Radiology No results found.  Procedures Procedures (including critical care time)  Medications Ordered in ED Medications  methylPREDNISolone sodium succinate (SOLU-MEDROL) 125 mg/2 mL injection 125 mg (125 mg Intramuscular Given 11/17/16 1757)  ketorolac (TORADOL) injection 60  mg (60 mg Intramuscular Given 11/17/16 1757)  HYDROmorphone (DILAUDID) injection 1 mg (1 mg Intramuscular Given 11/17/16 1757)     Initial Impression / Assessment and Plan / ED Course  I have reviewed the triage vital signs and the nursing notes.  Pertinent labs & imaging results that were available during my care of the patient were reviewed by me and considered in my medical decision making (see chart for details).     Pt reports pain improved.    Final Clinical Impressions(s) / ED Diagnoses   Final diagnoses:  Acute low back pain, unspecified back pain laterality, with sciatica presence unspecified  Acute bilateral low back pain, with sciatica presence unspecified    New Prescriptions New Prescriptions   DICLOFENAC (VOLTAREN) 50 MG EC TABLET    Take 1 tablet (50 mg total) by mouth 2 (two) times daily.   HYDROCODONE-ACETAMINOPHEN (NORCO/VICODIN) 5-325 MG TABLET    Take 2 tablets by mouth every 4 (four) hours as needed.   METHOCARBAMOL (ROBAXIN) 500 MG TABLET    Take 1 tablet (500 mg total) by mouth 2 (two) times daily.  An After Visit Summary was printed and given to the patient.    Fransico Meadow, PA-C 11/17/16 Kathaleen Maser    Fredia Sorrow, MD 11/18/16 [phone]

## 2016-11-17 NOTE — ED Triage Notes (Addendum)
Pt c/o bilateral mid to lower back pain that started several days ago. Denies injury and fever. Pt has used Advil and muscle relaxers with minimal relief.

## 2016-12-22 ENCOUNTER — Emergency Department (HOSPITAL_COMMUNITY)
Admission: EM | Admit: 2016-12-22 | Discharge: 2016-12-22 | Disposition: A | Discharge status: Home / Self Care | Payer: BLUE CROSS/BLUE SHIELD | Attending: Emergency Medicine | Admitting: Emergency Medicine

## 2016-12-22 ENCOUNTER — Encounter (HOSPITAL_COMMUNITY): Payer: Self-pay | Admitting: Emergency Medicine

## 2016-12-22 DIAGNOSIS — M545 Low back pain: Secondary | ICD-10-CM | POA: Diagnosis present

## 2016-12-22 DIAGNOSIS — I10 Essential (primary) hypertension: Secondary | ICD-10-CM | POA: Diagnosis not present

## 2016-12-22 DIAGNOSIS — Z79899 Other long term (current) drug therapy: Secondary | ICD-10-CM | POA: Diagnosis not present

## 2016-12-22 DIAGNOSIS — M62830 Muscle spasm of back: Secondary | ICD-10-CM | POA: Diagnosis not present

## 2016-12-22 DIAGNOSIS — Z791 Long term (current) use of non-steroidal anti-inflammatories (NSAID): Secondary | ICD-10-CM | POA: Insufficient documentation

## 2016-12-22 MED ORDER — KETOROLAC TROMETHAMINE 60 MG/2ML IM SOLN
60.0000 mg | Freq: Once | INTRAMUSCULAR | Status: AC
  Administered 2016-12-22: 60 mg via INTRAMUSCULAR
  Filled 2016-12-22: qty 2

## 2016-12-22 MED ORDER — NAPROXEN 500 MG PO TABS: 500.0000 mg | ORAL_TABLET | Freq: Two times a day (BID) | ORAL | 0 refills | Status: DC

## 2016-12-22 MED ORDER — METHOCARBAMOL 500 MG PO TABS
750.0000 mg | ORAL_TABLET | Freq: Once | ORAL | Status: AC
  Administered 2016-12-22: 750 mg via ORAL
  Filled 2016-12-22: qty 2

## 2016-12-22 MED ORDER — METHOCARBAMOL 500 MG PO TABS: 500.0000 mg | ORAL_TABLET | Freq: Every evening | ORAL | 0 refills | Status: DC | PRN

## 2016-12-22 NOTE — ED Notes (Signed)
Pt with mid back pain since starting to work out again, hx of same

## 2016-12-22 NOTE — ED Provider Notes (Signed)
[name] Provider Note   CSN: [id number] Arrival date & time: 12/22/16  1842     History   Chief Complaint Chief Complaint  Patient presents with  . Back Pain    HPI Xavier Daniels is a 51 y.o. male presenting with acute onset mid back pain and spasm. Patient reports that he was working out heavily lifting weights prior to the episode and felt sudden spasm and tightness in his back. Denies lower extremity numbness, weakness, malignancy, night sweats, unexplained weight loss, fever, chills, loss of bowel or bladder function.  HPI  Past Medical History:  Diagnosis Date  . Gout   . Gout   . Hypertension   . Substance abuse    ETOH, 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, CLEAN FOR 13 YEARS    Patient Active Problem List   Diagnosis Date Noted  . Gout attack 06/20/2014  . Essential hypertension 06/06/2014  . Hyperlipidemia 06/06/2014  . Gout 06/06/2014  . Obesity 06/06/2014    Past Surgical History:  Procedure Laterality Date  . TOTAL HIP ARTHROPLASTY  2007       Home Medications    Prior to Admission medications   Medication Sig Start Date End Date Taking? Authorizing Provider  atenolol (TENORMIN) 100 MG tablet TAKE ONE (1) TABLET BY MOUTH EVERY DAY 10/08/16   [place], Modena Nunnery, MD  Colchicine (MITIGARE) 0.6 MG CAPS Take 1 capsule by mouth daily as needed. 06/25/16   [place], Modena Nunnery, MD  diclofenac (VOLTAREN) 50 MG EC tablet Take 1 tablet (50 mg total) by mouth 2 (two) times daily. 11/17/16   Fransico Meadow, PA-C  HYDROcodone-acetaminophen (NORCO/VICODIN) 5-325 MG tablet Take 2 tablets by mouth every 4 (four) hours as needed. 11/17/16   Fransico Meadow, PA-C  ibuprofen (ADVIL,MOTRIN) 800 MG tablet TAKE ONE TABLET BY MOUTH EVERY 8 HOURS AS NEEDED --  **STOP  TAKING  DICLOFENAC** 07/19/16   [place], Modena Nunnery, MD  indomethacin (INDOCIN) 50 MG capsule Take 1 capsule (50 mg total) by mouth 3 (three) times daily as needed. 07/23/16   [place], Modena Nunnery, MD  methocarbamol (ROBAXIN) 500  MG tablet Take 1 tablet (500 mg total) by mouth at bedtime as needed for muscle spasms. 12/22/16   Avie Echevaria B, PA-C  naproxen (NAPROSYN) 500 MG tablet Take 1 tablet (500 mg total) by mouth 2 (two) times daily with a meal. 12/22/16   Emeline General, PA-C    Family History Family History  Problem Relation Age of Onset  . Diabetes Mother   . Diabetes Brother     Social History Social History  Substance Use Topics  . Smoking status: Never Smoker  . Smokeless tobacco: Never Used  . Alcohol use No     Allergies   Patient has no known allergies.   Review of Systems Review of Systems  Constitutional: Negative for chills and fever.  Cardiovascular: Negative for chest pain and palpitations.  Gastrointestinal: Negative for abdominal pain, nausea and vomiting.       No loss of bowel function, or saddle paresthesia  Genitourinary: Negative for difficulty urinating, dysuria, flank pain, frequency and hematuria.  Musculoskeletal: Positive for back pain, myalgias and neck pain. Negative for arthralgias, gait problem, joint swelling and neck stiffness.       Right-sided neck tenderness. Mid back tightness and pain  Skin: Negative for color change, pallor, rash and wound.  Neurological: Negative for tremors, weakness and numbness.     Physical Exam Updated Vital Signs BP Marland Kitchen)  156/81 (BP Location: Left Arm)   Pulse 80   Temp 98.1 F (36.7 C) (Oral)   Resp 16   Ht 6' (1.829 m)   Wt 108.9 kg (240 lb)   SpO2 98%   BMI 32.55 kg/m   Physical Exam  Constitutional: He appears well-developed and well-nourished. No distress.  Afebrile, nontoxic-appearing, sitting in bed in discomfort  HENT:  Head: Normocephalic and atraumatic.  Eyes: Conjunctivae are normal.  Neck: Normal range of motion. Neck supple.  Cardiovascular: Normal rate, regular rhythm, normal heart sounds and intact distal pulses.   No murmur heard. Pulmonary/Chest: Effort normal and breath sounds normal. No  respiratory distress. He has no wheezes.  Abdominal: He exhibits no distension.  Musculoskeletal: Normal range of motion. He exhibits tenderness. He exhibits no edema or deformity.  Patient with tenderness palpation of the right trapezius muscle and thoracic paraspinal musculature. No midline tenderness palpation of the spine. Patient is ambulatory without difficulties. Normal stance and gait.  Neurological: He is alert. No sensory deficit. He exhibits normal muscle tone.  5 out of 5 strength to hip flexion, knee flexion and extension, plantar flexion dorsiflexion. Are vascularly intact distally  Skin: Skin is warm and dry. No rash noted. He is not diaphoretic. No erythema. No pallor.  Psychiatric: He has a normal mood and affect.  Nursing note and vitals reviewed.    ED Treatments / Results  Labs (all labs ordered are listed, but only abnormal results are displayed) Labs Reviewed - No data to display  EKG  EKG Interpretation None       Radiology No results found.  Procedures Procedures (including critical care time)  Medications Ordered in ED Medications  ketorolac (TORADOL) injection 60 mg (60 mg Intramuscular Given 12/22/16 2002)  methocarbamol (ROBAXIN) tablet 750 mg (750 mg Oral Given 12/22/16 2001)     Initial Impression / Assessment and Plan / ED Course  I have reviewed the triage vital signs and the nursing notes.  Pertinent labs & imaging results that were available during my care of the patient were reviewed by me and considered in my medical decision making (see chart for details).    Patient presents with back pain after excessive weight lifting. Patient had a similar episode about a month ago.  No gross neurological deficits and normal neuro exam.  Patient has no gait abnormality or concern for cauda equina.  No loss of bowel or bladder control, fever, night sweats, weight loss, h/o malignancy, or IVDU.  RICE protocol and pain medications indicated and discussed  with patient.   History and exam consistent with muscle strain and spasm. Patient's pain was treated while in the emergency department with improvement.  Discharge home with symptomatic relief and close follow-up with PCP if symptoms persist beyond a week.  Discussed strict return precautions and advised to return to the emergency department if experiencing any new or worsening symptoms. Instructions were understood and patient agreed with discharge plan.  Final Clinical Impressions(s) / ED Diagnoses   Final diagnoses:  Muscle spasm of back    New Prescriptions Discharge Medication List as of 12/22/2016  8:24 PM    START taking these medications   Details  naproxen (NAPROSYN) 500 MG tablet Take 1 tablet (500 mg total) by mouth 2 (two) times daily with a meal., Starting Sun 12/22/2016, Print         Emeline General, PA-C 12/22/16 2052    Julianne Rice, MD 12/22/16 2310

## 2016-12-22 NOTE — Discharge Instructions (Signed)
As discussed, take naproxen twice a day with food and muscle relaxer at nighttime. Do not drive or operate machinery while taking this medication. Apply heat to the area and avoid excessive weight lifting. Gentle stretches and back exercises as instructed. Follow-up with your primary care provider if symptoms persist beyond a week.  Return if you experience worsening of symptoms, numbness, loss of bowel or bladder function or any other new concerning symptoms in the meantime.

## 2016-12-22 NOTE — ED Triage Notes (Signed)
Patient c/o mid back pain/spasms. Per patient started after "working out"/lifting weights. Per patient has happened in past.

## 2017-01-14 ENCOUNTER — Encounter (HOSPITAL_COMMUNITY): Payer: Self-pay | Admitting: Emergency Medicine

## 2017-01-14 ENCOUNTER — Emergency Department (HOSPITAL_COMMUNITY)
Admission: EM | Admit: 2017-01-14 | Discharge: 2017-01-14 | Disposition: A | Discharge status: Home / Self Care | Payer: BLUE CROSS/BLUE SHIELD | Attending: Emergency Medicine | Admitting: Emergency Medicine

## 2017-01-14 DIAGNOSIS — Z79899 Other long term (current) drug therapy: Secondary | ICD-10-CM | POA: Diagnosis not present

## 2017-01-14 DIAGNOSIS — M545 Low back pain, unspecified: Secondary | ICD-10-CM

## 2017-01-14 DIAGNOSIS — I10 Essential (primary) hypertension: Secondary | ICD-10-CM | POA: Insufficient documentation

## 2017-01-14 MED ORDER — CYCLOBENZAPRINE HCL 10 MG PO TABS: 10.0000 mg | ORAL_TABLET | Freq: Two times a day (BID) | ORAL | 0 refills | Status: DC | PRN

## 2017-01-14 MED ORDER — NAPROXEN 500 MG PO TABS: 500.0000 mg | ORAL_TABLET | Freq: Two times a day (BID) | ORAL | 0 refills | Status: DC

## 2017-01-14 MED ORDER — HYDROMORPHONE HCL 1 MG/ML IJ SOLN
1.0000 mg | Freq: Once | INTRAMUSCULAR | Status: AC
  Administered 2017-01-14: 1 mg via INTRAMUSCULAR
  Filled 2017-01-14: qty 1

## 2017-01-14 NOTE — ED Triage Notes (Signed)
Notice pain to mid-lower back after washing dishes.  Denies injury.  Rates pain 10/10.

## 2017-01-14 NOTE — ED Provider Notes (Signed)
[facility] DEPT Provider Note   CSN: [id number] Arrival date & time: 01/14/17  5053     History   Chief Complaint Chief Complaint  Patient presents with  . Back Pain    HPI Xavier Daniels is a 51 y.o. male.  Left mid to lower back pain after bending over washing dishes all day yesterday. No radicular symptoms. Severity of pain is mild to moderate. Position and palpation make pain worse.      Past Medical History:  Diagnosis Date  . Gout   . Gout   . Hypertension   . Substance abuse ([place])    ETOH, 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, CLEAN FOR 13 YEARS    Patient Active Problem List   Diagnosis Date Noted  . Gout attack 06/20/2014  . Essential hypertension 06/06/2014  . Hyperlipidemia 06/06/2014  . Gout 06/06/2014  . Obesity 06/06/2014    Past Surgical History:  Procedure Laterality Date  . TOTAL HIP ARTHROPLASTY  2007       Home Medications    Prior to Admission medications   Medication Sig Start Date End Date Taking? Authorizing Provider  atenolol (TENORMIN) 100 MG tablet TAKE ONE (1) TABLET BY MOUTH EVERY DAY 10/08/16   [place], Modena Nunnery, MD  Colchicine (MITIGARE) 0.6 MG CAPS Take 1 capsule by mouth daily as needed. 06/25/16   [place], Modena Nunnery, MD  cyclobenzaprine (FLEXERIL) 10 MG tablet Take 1 tablet (10 mg total) by mouth 2 (two) times daily as needed for muscle spasms. 01/14/17   Nat Christen, MD  diclofenac (VOLTAREN) 50 MG EC tablet Take 1 tablet (50 mg total) by mouth 2 (two) times daily. 11/17/16   Fransico Meadow, PA-C  HYDROcodone-acetaminophen (NORCO/VICODIN) 5-325 MG tablet Take 2 tablets by mouth every 4 (four) hours as needed. 11/17/16   Fransico Meadow, PA-C  ibuprofen (ADVIL,MOTRIN) 800 MG tablet TAKE ONE TABLET BY MOUTH EVERY 8 HOURS AS NEEDED --  **STOP  TAKING  DICLOFENAC** 07/19/16   [place], Modena Nunnery, MD  indomethacin (INDOCIN) 50 MG capsule Take 1 capsule (50 mg total) by mouth 3 (three) times daily as needed. 07/23/16   [place], Modena Nunnery, MD    methocarbamol (ROBAXIN) 500 MG tablet Take 1 tablet (500 mg total) by mouth at bedtime as needed for muscle spasms. 12/22/16   Avie Echevaria B, PA-C  naproxen (NAPROSYN) 500 MG tablet Take 1 tablet (500 mg total) by mouth 2 (two) times daily. 01/14/17   Nat Christen, MD    Family History Family History  Problem Relation Age of Onset  . Diabetes Mother   . Diabetes Brother     Social History Social History  Substance Use Topics  . Smoking status: Never Smoker  . Smokeless tobacco: Never Used  . Alcohol use No     Allergies   Patient has no known allergies.   Review of Systems Review of Systems  All other systems reviewed and are negative.    Physical Exam Updated Vital Signs BP (!) 142/101 (BP Location: Left Arm)   Pulse 75   Temp 97.6 F (36.4 C) (Oral)   Resp 16   Ht 6' (1.829 m)   Wt 109.8 kg (242 lb)   SpO2 100%   BMI 32.82 kg/m   Physical Exam  Constitutional: He is oriented to person, place, and time. He appears well-developed and well-nourished.  HENT:  Head: Normocephalic and atraumatic.  Eyes: Conjunctivae are normal.  Neck: Neck supple.  Cardiovascular: Normal rate and regular rhythm.  Pulmonary/Chest: Effort normal and breath sounds normal.  Abdominal: Soft. Bowel sounds are normal.  Musculoskeletal:  Muscular tenderness left back area approximately T10-12 distribution  Neurological: He is alert and oriented to person, place, and time.  Skin: Skin is warm and dry.  Psychiatric: He has a normal mood and affect. His behavior is normal.  Nursing note and vitals reviewed.    ED Treatments / Results  Labs (all labs ordered are listed, but only abnormal results are displayed) Labs Reviewed - No data to display  EKG  EKG Interpretation None       Radiology No results found.  Procedures Procedures (including critical care time)  Medications Ordered in ED Medications  HYDROmorphone (DILAUDID) injection 1 mg (1 mg Intramuscular Given  01/14/17 3383)     Initial Impression / Assessment and Plan / ED Course  I have reviewed the triage vital signs and the nursing notes.  Pertinent labs & imaging results that were available during my care of the patient were reviewed by me and considered in my medical decision making (see chart for details).     History and physical most consistent with musculoskeletal pain. Will Rx Dilaudid 1 mg IM in the ED. Discharge medications Naprosyn 500 mg of Flexeril 10 mg  Final Clinical Impressions(s) / ED Diagnoses   Final diagnoses:  Acute left-sided low back pain without sciatica    New Prescriptions New Prescriptions   CYCLOBENZAPRINE (FLEXERIL) 10 MG TABLET    Take 1 tablet (10 mg total) by mouth 2 (two) times daily as needed for muscle spasms.   NAPROXEN (NAPROSYN) 500 MG TABLET    Take 1 tablet (500 mg total) by mouth 2 (two) times daily.     Nat Christen, MD 01/14/17 [phone]

## 2017-01-14 NOTE — Discharge Instructions (Signed)
Cold pack for 2 days, then heat. Prescription for pain medicine and muscle relaxer.

## 2017-04-28 ENCOUNTER — Emergency Department (HOSPITAL_COMMUNITY)
Admission: EM | Admit: 2017-04-28 | Discharge: 2017-04-28 | Disposition: A | Discharge status: Home / Self Care | Payer: BLUE CROSS/BLUE SHIELD | Attending: Emergency Medicine | Admitting: Emergency Medicine

## 2017-04-28 ENCOUNTER — Encounter (HOSPITAL_COMMUNITY): Payer: Self-pay

## 2017-04-28 DIAGNOSIS — R112 Nausea with vomiting, unspecified: Secondary | ICD-10-CM | POA: Diagnosis not present

## 2017-04-28 DIAGNOSIS — Z79899 Other long term (current) drug therapy: Secondary | ICD-10-CM | POA: Insufficient documentation

## 2017-04-28 DIAGNOSIS — R1011 Right upper quadrant pain: Secondary | ICD-10-CM | POA: Diagnosis present

## 2017-04-28 DIAGNOSIS — I10 Essential (primary) hypertension: Secondary | ICD-10-CM | POA: Insufficient documentation

## 2017-04-28 DIAGNOSIS — R1013 Epigastric pain: Secondary | ICD-10-CM | POA: Insufficient documentation

## 2017-04-28 LAB — CBC WITH DIFFERENTIAL/PLATELET
BASOS ABS: 0 10*3/uL (ref 0.0–0.1)
Basophils Relative: 0 %
EOS ABS: 0 10*3/uL (ref 0.0–0.7)
EOS PCT: 0 %
HCT: 43.1 % (ref 39.0–52.0)
Hemoglobin: 14.5 g/dL (ref 13.0–17.0)
LYMPHS ABS: 1 10*3/uL (ref 0.7–4.0)
Lymphocytes Relative: 11 %
MCH: 29.7 pg (ref 26.0–34.0)
MCHC: 33.6 g/dL (ref 30.0–36.0)
MCV: 88.1 fL (ref 78.0–100.0)
Monocytes Absolute: 0.3 10*3/uL (ref 0.1–1.0)
Monocytes Relative: 3 %
Neutro Abs: 8.1 10*3/uL — ABNORMAL HIGH (ref 1.7–7.7)
Neutrophils Relative %: 86 %
PLATELETS: 190 10*3/uL (ref 150–400)
RBC: 4.89 MIL/uL (ref 4.22–5.81)
RDW: 13.4 % (ref 11.5–15.5)
WBC: 9.4 10*3/uL (ref 4.0–10.5)

## 2017-04-28 LAB — URINALYSIS, ROUTINE W REFLEX MICROSCOPIC
BILIRUBIN URINE: NEGATIVE
GLUCOSE, UA: NEGATIVE mg/dL
Hgb urine dipstick: NEGATIVE
KETONES UR: NEGATIVE mg/dL
Leukocytes, UA: NEGATIVE
NITRITE: NEGATIVE
PH: 8 (ref 5.0–8.0)
Protein, ur: NEGATIVE mg/dL
Specific Gravity, Urine: 1.018 (ref 1.005–1.030)

## 2017-04-28 LAB — COMPREHENSIVE METABOLIC PANEL
ALBUMIN: 4.2 g/dL (ref 3.5–5.0)
ALK PHOS: 66 U/L (ref 38–126)
ALT: 17 U/L (ref 17–63)
AST: 30 U/L (ref 15–41)
Anion gap: 15 (ref 5–15)
BUN: 14 mg/dL (ref 6–20)
CALCIUM: 9.8 mg/dL (ref 8.9–10.3)
CHLORIDE: 101 mmol/L (ref 101–111)
CO2: 22 mmol/L (ref 22–32)
CREATININE: 1.14 mg/dL (ref 0.61–1.24)
GFR calc Af Amer: >60 mL/min (ref >60)
GFR calc non Af Amer: >60 mL/min (ref >60)
GLUCOSE: 159 mg/dL — AB (ref 65–99)
Potassium: 3.4 mmol/L — ABNORMAL LOW (ref 3.5–5.1)
SODIUM: 138 mmol/L (ref 135–145)
Total Bilirubin: 0.6 mg/dL (ref 0.3–1.2)
Total Protein: 7.9 g/dL (ref 6.5–8.1)

## 2017-04-28 LAB — LIPASE, BLOOD: Lipase: 22 U/L (ref 11–51)

## 2017-04-28 MED ORDER — FAMOTIDINE 20 MG PO TABS: 20.0000 mg | ORAL_TABLET | Freq: Two times a day (BID) | ORAL | 0 refills | Status: DC

## 2017-04-28 MED ORDER — ONDANSETRON HCL 4 MG/2ML IJ SOLN
4.0000 mg | Freq: Once | INTRAMUSCULAR | Status: AC
Start: 2017-04-28 — End: 2017-04-28
  Administered 2017-04-28: 4 mg via INTRAVENOUS
  Filled 2017-04-28: qty 2

## 2017-04-28 MED ORDER — ONDANSETRON 8 MG PO TBDP: 8.0000 mg | ORAL_TABLET | Freq: Three times a day (TID) | ORAL | 0 refills | Status: DC | PRN

## 2017-04-28 MED ORDER — MORPHINE SULFATE (PF) 4 MG/ML IV SOLN
4.0000 mg | Freq: Once | INTRAVENOUS | Status: AC
  Administered 2017-04-28: 4 mg via INTRAVENOUS
  Filled 2017-04-28: qty 1

## 2017-04-28 MED ORDER — SODIUM CHLORIDE 0.9 % IV BOLUS (SEPSIS)
1000.0000 mL | Freq: Once | INTRAVENOUS | Status: AC
  Administered 2017-04-28: 1000 mL via INTRAVENOUS

## 2017-04-28 NOTE — ED Provider Notes (Signed)
[facility] EMERGENCY DEPARTMENT Provider Note   CSN: [id number] Arrival date & time: 04/28/17  1829     History   Chief Complaint Chief Complaint  Patient presents with  . Abdominal Pain    HPI Xavier Daniels is a 52 y.o. male.  Epigastric and right upper quadrant pain with associated nausea and vomiting since early this morning after eating a burrito and several sweet items.  No fever, chills, diarrhea, dysuria.  No previous abdominal pathology.  No history of gallbladder disease.  Severity of symptoms is moderate.  Eating makes symptoms worse.      Past Medical History:  Diagnosis Date  . Gout   . Gout   . Hypertension   . Substance abuse ([place])    ETOH, 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, CLEAN FOR 13 YEARS    Patient Active Problem List   Diagnosis Date Noted  . Gout attack 06/20/2014  . Essential hypertension 06/06/2014  . Hyperlipidemia 06/06/2014  . Gout 06/06/2014  . Obesity 06/06/2014    Past Surgical History:  Procedure Laterality Date  . TOTAL HIP ARTHROPLASTY  2007       Home Medications    Prior to Admission medications   Medication Sig Start Date End Date Taking? Authorizing Provider  atenolol (TENORMIN) 100 MG tablet TAKE ONE (1) TABLET BY MOUTH EVERY DAY 10/08/16  Yes [place], Modena Nunnery, MD  Colchicine (MITIGARE) 0.6 MG CAPS Take 1 capsule by mouth daily as needed. 06/25/16  Yes [place], Modena Nunnery, MD  cyclobenzaprine (FLEXERIL) 10 MG tablet Take 1 tablet (10 mg total) by mouth 2 (two) times daily as needed for muscle spasms. 01/14/17  Yes Nat Christen, MD  HYDROcodone-acetaminophen (NORCO/VICODIN) 5-325 MG tablet Take 2 tablets by mouth every 4 (four) hours as needed. 11/17/16  Yes Fransico Meadow, PA-C  diclofenac (VOLTAREN) 50 MG EC tablet Take 1 tablet (50 mg total) by mouth 2 (two) times daily. Patient not taking: Reported on 04/28/2017 11/17/16   Fransico Meadow, PA-C  famotidine (PEPCID) 20 MG tablet Take 1 tablet (20 mg total) by mouth 2 (two) times daily.  04/28/17   Nat Christen, MD  ibuprofen (ADVIL,MOTRIN) 800 MG tablet TAKE ONE TABLET BY MOUTH EVERY 8 HOURS AS NEEDED --  **STOP  TAKING  DICLOFENAC** Patient not taking: Reported on 04/28/2017 07/19/16   Alycia Rossetti, MD  methocarbamol (ROBAXIN) 500 MG tablet Take 1 tablet (500 mg total) by mouth at bedtime as needed for muscle spasms. Patient not taking: Reported on 04/28/2017 12/22/16   Avie Echevaria B, PA-C  naproxen (NAPROSYN) 500 MG tablet Take 1 tablet (500 mg total) by mouth 2 (two) times daily. Patient not taking: Reported on 04/28/2017 01/14/17   Nat Christen, MD  ondansetron [facility] ODT) 8 MG disintegrating tablet Take 1 tablet (8 mg total) by mouth every 8 (eight) hours as needed for nausea or vomiting. 04/28/17   Nat Christen, MD    Family History Family History  Problem Relation Age of Onset  . Diabetes Mother   . Diabetes Brother     Social History Social History   Tobacco Use  . Smoking status: Never Smoker  . Smokeless tobacco: Never Used  Substance Use Topics  . Alcohol use: No  . Drug use: No     Allergies   Patient has no known allergies.   Review of Systems Review of Systems  All other systems reviewed and are negative.    Physical Exam Updated Vital Signs BP 133/81 (BP Location:  Left Arm)   Pulse 65   Temp 98.5 F (36.9 C) (Oral)   Resp 20   Ht 6\' 1"  (1.854 m)   Wt 112.5 kg (248 lb)   SpO2 98%   BMI 32.72 kg/m   Physical Exam  Constitutional: He is oriented to person, place, and time. He appears well-developed and well-nourished.  HENT:  Head: Normocephalic and atraumatic.  Eyes: Conjunctivae are normal.  Neck: Neck supple.  Cardiovascular: Normal rate and regular rhythm.  Pulmonary/Chest: Effort normal and breath sounds normal.  Abdominal: Soft. Bowel sounds are normal.  Minimal epigastric and right upper quadrant tenderness.  Musculoskeletal: Normal range of motion.  Neurological: He is alert and oriented to person, place, and time.    Skin: Skin is warm and dry.  Psychiatric: He has a normal mood and affect. His behavior is normal.  Nursing note and vitals reviewed.    ED Treatments / Results  Labs (all labs ordered are listed, but only abnormal results are displayed) Labs Reviewed  CBC WITH DIFFERENTIAL/PLATELET - Abnormal; Notable for the following components:      Result Value   Neutro Abs 8.1 (*)    All other components within normal limits  COMPREHENSIVE METABOLIC PANEL - Abnormal; Notable for the following components:   Potassium 3.4 (*)    Glucose, Bld 159 (*)    All other components within normal limits  URINALYSIS, ROUTINE W REFLEX MICROSCOPIC  LIPASE, BLOOD    EKG  EKG Interpretation None       Radiology No results found.  Procedures Procedures (including critical care time)  Medications Ordered in ED Medications  sodium chloride 0.9 % bolus 1,000 mL (0 mLs Intravenous Stopped 04/28/17 0856)  ondansetron (ZOFRAN) injection 4 mg (4 mg Intravenous Given 04/28/17 0808)  morphine 4 MG/ML injection 4 mg (4 mg Intravenous Given 04/28/17 8295)     Initial Impression / Assessment and Plan / ED Course  I have reviewed the triage vital signs and the nursing notes.  Pertinent labs & imaging results that were available during my care of the patient were reviewed by me and considered in my medical decision making (see chart for details).     History and physical most consistent with gastritis secondary to his dietary choices.  Labs are reassuring.  He feels better after IV fluids, IV Zofran, IV morphine.  No acute abdomen at discharge.  Discharge medications Pepcid 20 mg and Zofran 8 mg ODT  Final Clinical Impressions(s) / ED Diagnoses   Final diagnoses:  Intractable vomiting with nausea, unspecified vomiting type    ED Discharge Orders        Ordered    ondansetron (ZOFRAN ODT) 8 MG disintegrating tablet  Every 8 hours PRN     04/28/17 1248    famotidine (PEPCID) 20 MG tablet  2 times  daily     04/28/17 1248       Nat Christen, MD 04/28/17 1252

## 2017-04-28 NOTE — Discharge Instructions (Signed)
Tests show no life-threatening condition.  Prescription for nausea and stomach medicine.  Clear liquids today.  Return here if pain persists into the right lower abdomen which could be a sign of appendicitis.

## 2017-04-28 NOTE — ED Notes (Signed)
Pt up ambulatory to bathroom.  No distress.  Continues to c/o abdominal pain.

## 2017-04-28 NOTE — ED Notes (Signed)
Reminded patient that a urine specimen is needed. Patient states that he is not able to void at this time.

## 2017-04-28 NOTE — ED Triage Notes (Signed)
Pt reports abd pain and vomiting after eating last night.  Denies diarrhea.  LBM was this morning and was normal per pt.

## 2017-09-29 ENCOUNTER — Emergency Department (HOSPITAL_COMMUNITY)
Admission: EM | Admit: 2017-09-29 | Discharge: 2017-09-29 | Disposition: A | Discharge status: Home / Self Care | Payer: BLUE CROSS/BLUE SHIELD | Attending: Emergency Medicine | Admitting: Emergency Medicine

## 2017-09-29 ENCOUNTER — Encounter (HOSPITAL_COMMUNITY): Payer: Self-pay | Admitting: Emergency Medicine

## 2017-09-29 DIAGNOSIS — Z79899 Other long term (current) drug therapy: Secondary | ICD-10-CM | POA: Insufficient documentation

## 2017-09-29 DIAGNOSIS — I10 Essential (primary) hypertension: Secondary | ICD-10-CM | POA: Insufficient documentation

## 2017-09-29 DIAGNOSIS — E785 Hyperlipidemia, unspecified: Secondary | ICD-10-CM | POA: Insufficient documentation

## 2017-09-29 DIAGNOSIS — M79644 Pain in right finger(s): Secondary | ICD-10-CM | POA: Insufficient documentation

## 2017-09-29 DIAGNOSIS — M109 Gout, unspecified: Secondary | ICD-10-CM | POA: Insufficient documentation

## 2017-09-29 MED ORDER — HYDROCODONE-ACETAMINOPHEN 5-325 MG PO TABS
1.0000 | ORAL_TABLET | Freq: Once | ORAL | Status: AC
  Administered 2017-09-29: 1 via ORAL
  Filled 2017-09-29: qty 1

## 2017-09-29 MED ORDER — PREDNISONE 20 MG PO TABS: 40.0000 mg | ORAL_TABLET | Freq: Every day | ORAL | 0 refills | Status: DC

## 2017-09-29 MED ORDER — HYDROCODONE-ACETAMINOPHEN 5-325 MG PO TABS: ORAL_TABLET | ORAL | 0 refills | Status: DC

## 2017-09-29 MED ORDER — PREDNISONE 20 MG PO TABS
40.0000 mg | ORAL_TABLET | Freq: Once | ORAL | Status: AC
  Administered 2017-09-29: 40 mg via ORAL
  Filled 2017-09-29: qty 2

## 2017-09-29 NOTE — Discharge Instructions (Addendum)
Start your prednisone prescription tomorrow.  Follow-up with your primary provider or return to the ER for any worsening symptoms such as fever, increasing pain swelling or redness of your wrist or thumb.

## 2017-09-29 NOTE — ED Triage Notes (Signed)
Pt reports pain in right thumb x 2-3 days.  Hx of gout.  States feels the same.

## 2017-10-01 NOTE — ED Provider Notes (Signed)
[facility] EMERGENCY DEPARTMENT Provider Note   CSN: [id number] Arrival date & time: 09/29/17  1403     History   Chief Complaint Chief Complaint  Patient presents with  . Hand Pain    HPI Xavier Daniels is a 52 y.o. male.  HPI   Xavier Daniels is a 52 y.o. male with hx of gout, presents to the Emergency Department complaining of right thumb pain.  Pain present for 2-3 days.  Pain radiates to the wrist.  Pain feels similar to previous gout flares.  He states he is out of his gout medication. Symptoms began after eating food with MSG.   No injury.  Denies redness, numbness, or pain to other fingers.     Past Medical History:  Diagnosis Date  . Gout   . Gout   . Hypertension   . Substance abuse ([place])    ETOH, 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, CLEAN FOR 13 YEARS    Patient Active Problem List   Diagnosis Date Noted  . Gout attack 06/20/2014  . Essential hypertension 06/06/2014  . Hyperlipidemia 06/06/2014  . Gout 06/06/2014  . Obesity 06/06/2014    Past Surgical History:  Procedure Laterality Date  . TOTAL HIP ARTHROPLASTY  2007      Home Medications    Prior to Admission medications   Medication Sig Start Date End Date Taking? Authorizing Provider  atenolol (TENORMIN) 100 MG tablet TAKE ONE (1) TABLET BY MOUTH EVERY DAY 10/08/16   [place], Modena Nunnery, MD  Colchicine (MITIGARE) 0.6 MG CAPS Take 1 capsule by mouth daily as needed. 06/25/16   [place], Modena Nunnery, MD  cyclobenzaprine (FLEXERIL) 10 MG tablet Take 1 tablet (10 mg total) by mouth 2 (two) times daily as needed for muscle spasms. 01/14/17   Nat Christen, MD  diclofenac (VOLTAREN) 50 MG EC tablet Take 1 tablet (50 mg total) by mouth 2 (two) times daily. Patient not taking: Reported on 04/28/2017 11/17/16   Fransico Meadow, PA-C  famotidine (PEPCID) 20 MG tablet Take 1 tablet (20 mg total) by mouth 2 (two) times daily. 04/28/17   Nat Christen, MD  HYDROcodone-acetaminophen (NORCO/VICODIN) 5-325 MG tablet Take one tab po q 4 hrs  prn pain 09/29/17   Michayla Mcneil, PA-C  ibuprofen (ADVIL,MOTRIN) 800 MG tablet TAKE ONE TABLET BY MOUTH EVERY 8 HOURS AS NEEDED --  **STOP  TAKING  DICLOFENAC** Patient not taking: Reported on 04/28/2017 07/19/16   Alycia Rossetti, MD  methocarbamol (ROBAXIN) 500 MG tablet Take 1 tablet (500 mg total) by mouth at bedtime as needed for muscle spasms. Patient not taking: Reported on 04/28/2017 12/22/16   Avie Echevaria B, PA-C  naproxen (NAPROSYN) 500 MG tablet Take 1 tablet (500 mg total) by mouth 2 (two) times daily. Patient not taking: Reported on 04/28/2017 01/14/17   Nat Christen, MD  ondansetron [facility] ODT) 8 MG disintegrating tablet Take 1 tablet (8 mg total) by mouth every 8 (eight) hours as needed for nausea or vomiting. 04/28/17   Nat Christen, MD  predniSONE (DELTASONE) 20 MG tablet Take 2 tablets (40 mg total) by mouth daily. For 5 days 09/29/17   Kem Parkinson, PA-C    Family History Family History  Problem Relation Age of Onset  . Diabetes Mother   . Diabetes Brother     Social History Social History   Tobacco Use  . Smoking status: Never Smoker  . Smokeless tobacco: Never Used  Substance Use Topics  . Alcohol use: No  .  Drug use: No     Allergies   Patient has no known allergies.   Review of Systems Review of Systems  Constitutional: Negative for chills and fever.  Musculoskeletal: Positive for arthralgias (right thumb pain) and joint swelling. Negative for neck pain.  Skin: Negative for color change and wound.  Neurological: Negative for weakness and numbness.     Physical Exam Updated Vital Signs BP 120/85 (BP Location: Right Arm)   Pulse 65   Temp 97.7 F (36.5 C) (Oral)   Resp 16   Ht 6' (1.829 m)   Wt 108.9 kg (240 lb)   SpO2 100%   BMI 32.55 kg/m   Physical Exam  Constitutional: He appears well-developed and well-nourished. No distress.  Neck: Normal range of motion.  Cardiovascular: Normal rate, regular rhythm and intact distal pulses.    Pulmonary/Chest: Effort normal and breath sounds normal.  Musculoskeletal: He exhibits tenderness. He exhibits no deformity.  ttp of the proximal right thumb and wrist.  Mild edema of the thenar eminence of the thumb.  No erythema.    Neurological: He is alert. No sensory deficit.  Skin: Capillary refill takes less than 2 seconds. No erythema.  Psychiatric: He has a normal mood and affect.  Nursing note and vitals reviewed.    ED Treatments / Results  Labs (all labs ordered are listed, but only abnormal results are displayed) Labs Reviewed - No data to display  EKG None  Radiology No results found.  Procedures Procedures (including critical care time)  Medications Ordered in ED Medications  predniSONE (DELTASONE) tablet 40 mg (40 mg Oral Given 09/29/17 1534)  HYDROcodone-acetaminophen (NORCO/VICODIN) 5-325 MG per tablet 1 tablet (1 tablet Oral Given 09/29/17 1534)     Initial Impression / Assessment and Plan / ED Course  I have reviewed the triage vital signs and the nursing notes.  Pertinent labs & imaging results that were available during my care of the patient were reviewed by me and considered in my medical decision making (see chart for details).     Pt with hx of recurrent gout. Pain to right thumb, possible gout flare.  NV intact.  No concerning sx's for infectious process.  Will tx with pain medication and prednisone.  Return precautions discussed.   Final Clinical Impressions(s) / ED Diagnoses   Final diagnoses:  Pain of right thumb  Acute gout of right wrist, unspecified cause    ED Discharge Orders        Ordered    predniSONE (DELTASONE) 20 MG tablet  Daily     09/29/17 1528    HYDROcodone-acetaminophen (NORCO/VICODIN) 5-325 MG tablet     09/29/17 1528       Kem Parkinson, PA-C 10/01/17 2157    Isla Pence, MD 10/04/17 [phone]

## 2017-10-11 ENCOUNTER — Emergency Department (HOSPITAL_COMMUNITY): Payer: Self-pay

## 2017-10-11 ENCOUNTER — Encounter (HOSPITAL_COMMUNITY): Payer: Self-pay | Admitting: Emergency Medicine

## 2017-10-11 ENCOUNTER — Emergency Department (HOSPITAL_COMMUNITY)
Admission: EM | Admit: 2017-10-11 | Discharge: 2017-10-11 | Disposition: A | Discharge status: Home / Self Care | Payer: Self-pay | Attending: Emergency Medicine | Admitting: Emergency Medicine

## 2017-10-11 ENCOUNTER — Other Ambulatory Visit: Payer: Self-pay

## 2017-10-11 DIAGNOSIS — X58XXXA Exposure to other specified factors, initial encounter: Secondary | ICD-10-CM | POA: Insufficient documentation

## 2017-10-11 DIAGNOSIS — S63636A Sprain of interphalangeal joint of right little finger, initial encounter: Secondary | ICD-10-CM | POA: Insufficient documentation

## 2017-10-11 DIAGNOSIS — Y939 Activity, unspecified: Secondary | ICD-10-CM | POA: Insufficient documentation

## 2017-10-11 DIAGNOSIS — Z79899 Other long term (current) drug therapy: Secondary | ICD-10-CM | POA: Insufficient documentation

## 2017-10-11 DIAGNOSIS — Y999 Unspecified external cause status: Secondary | ICD-10-CM | POA: Insufficient documentation

## 2017-10-11 DIAGNOSIS — Z96649 Presence of unspecified artificial hip joint: Secondary | ICD-10-CM | POA: Insufficient documentation

## 2017-10-11 DIAGNOSIS — Y929 Unspecified place or not applicable: Secondary | ICD-10-CM | POA: Insufficient documentation

## 2017-10-11 DIAGNOSIS — I10 Essential (primary) hypertension: Secondary | ICD-10-CM | POA: Insufficient documentation

## 2017-10-11 DIAGNOSIS — M25531 Pain in right wrist: Secondary | ICD-10-CM

## 2017-10-11 HISTORY — DX: Unspecified osteoarthritis, unspecified site: M19.90

## 2017-10-11 MED ORDER — OXYCODONE-ACETAMINOPHEN 5-325 MG PO TABS
1.0000 | ORAL_TABLET | Freq: Once | ORAL | Status: AC
  Administered 2017-10-11: 1 via ORAL
  Filled 2017-10-11: qty 1

## 2017-10-11 MED ORDER — OXYCODONE-ACETAMINOPHEN 5-325 MG PO TABS
1.0000 | ORAL_TABLET | ORAL | 0 refills | Status: DC | PRN
Start: 2017-10-11 — End: 2017-10-20

## 2017-10-11 NOTE — ED Notes (Signed)
Pain to R wrist for the last 2 days  Taking ibuprofen with some relief in the daytime, but at night, wrist throbbing  Pt reports previous dx some years ago of arthritis in that arm, but did not bother him  He also has a hx of gout, but insists that this is not gout

## 2017-10-11 NOTE — Discharge Instructions (Addendum)
Wear the splint as directed.  Call Dr. Ruthe Mannan office to arrange a follow-up appt.

## 2017-10-11 NOTE — ED Triage Notes (Signed)
Pt reports R wrist swelling and pain for two days, hx of arthritis and gout. States this does not feel like gout. Taken motrin with no relief.

## 2017-10-12 NOTE — ED Provider Notes (Signed)
[facility] EMERGENCY DEPARTMENT Provider Note   CSN: [id number] Arrival date & time: 10/11/17  1806     History   Chief Complaint Chief Complaint  Patient presents with  . Wrist Pain    HPI Xavier Daniels is a 52 y.o. male.  HPI  Xavier Daniels is a 52 y.o. male who presents to the Emergency Department complaining of right wrist pain.  He describes a throbbing pain to his wrist that is constant, but worsens with movement.  symptoms has been worsening for 2 days.  He reports hx of gout to his wrist and thumb, but pain feels different.  He denies injury, redness, numbness and pain tot he fingers.  Has taken ibuprofen without relief.    Past Medical History:  Diagnosis Date  . Arthritis   . Gout   . Gout   . Hypertension   . Substance abuse ([place])    ETOH, 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, CLEAN FOR 13 YEARS    Patient Active Problem List   Diagnosis Date Noted  . Gout attack 06/20/2014  . Essential hypertension 06/06/2014  . Hyperlipidemia 06/06/2014  . Gout 06/06/2014  . Obesity 06/06/2014    Past Surgical History:  Procedure Laterality Date  . TOTAL HIP ARTHROPLASTY  2007      Home Medications    Prior to Admission medications   Medication Sig Start Date End Date Taking? Authorizing Provider  atenolol (TENORMIN) 100 MG tablet TAKE ONE (1) TABLET BY MOUTH EVERY DAY 10/08/16   [place], Modena Nunnery, MD  Colchicine (MITIGARE) 0.6 MG CAPS Take 1 capsule by mouth daily as needed. 06/25/16   [place], Modena Nunnery, MD  cyclobenzaprine (FLEXERIL) 10 MG tablet Take 1 tablet (10 mg total) by mouth 2 (two) times daily as needed for muscle spasms. 01/14/17   Nat Christen, MD  famotidine (PEPCID) 20 MG tablet Take 1 tablet (20 mg total) by mouth 2 (two) times daily. 04/28/17   Nat Christen, MD  HYDROcodone-acetaminophen (NORCO/VICODIN) 5-325 MG tablet Take one tab po q 4 hrs prn pain 09/29/17   Teryn Boerema, PA-C  methocarbamol (ROBAXIN) 500 MG tablet Take 1 tablet (500 mg total) by mouth at  bedtime as needed for muscle spasms. Patient not taking: Reported on 04/28/2017 12/22/16   Avie Echevaria B, PA-C  ondansetron (ZOFRAN ODT) 8 MG disintegrating tablet Take 1 tablet (8 mg total) by mouth every 8 (eight) hours as needed for nausea or vomiting. 04/28/17   Nat Christen, MD  oxyCODONE-acetaminophen (PERCOCET/ROXICET) 5-325 MG tablet Take 1 tablet by mouth every 4 (four) hours as needed. 10/11/17   Tyrion Glaude, PA-C  predniSONE (DELTASONE) 20 MG tablet Take 2 tablets (40 mg total) by mouth daily. For 5 days 09/29/17   Kem Parkinson, PA-C    Family History Family History  Problem Relation Age of Onset  . Diabetes Mother   . Diabetes Brother     Social History Social History   Tobacco Use  . Smoking status: Never Smoker  . Smokeless tobacco: Never Used  Substance Use Topics  . Alcohol use: No  . Drug use: No     Allergies   Patient has no known allergies.   Review of Systems Review of Systems  Constitutional: Negative for chills and fever.  Musculoskeletal: Positive for arthralgias (right wrist pain) and joint swelling. Negative for neck pain.  Skin: Negative for color change and wound.  Neurological: Negative for weakness and numbness.  All other systems reviewed and are negative.  Physical Exam Updated Vital Signs BP (!) 140/92 (BP Location: Right Arm)   Pulse 71   Temp (!) 97.5 F (36.4 C) (Oral)   Resp 14   Wt 108.9 kg (240 lb)   SpO2 97%   BMI 32.55 kg/m   Physical Exam  Constitutional: He appears well-developed and well-nourished. No distress.  HENT:  Head: Atraumatic.  Cardiovascular: Normal rate, regular rhythm and intact distal pulses.  Pulmonary/Chest: Effort normal and breath sounds normal.  Musculoskeletal: He exhibits edema and tenderness.  ttp of the right wrist over the scaphoid.  Mild edema.  No erythema or pain proximal to the wrist.    Neurological: He is alert. No sensory deficit.  Skin: Skin is warm. Capillary refill takes  less than 2 seconds. No rash noted. No erythema.  Psychiatric: He has a normal mood and affect.  Nursing note and vitals reviewed.    ED Treatments / Results  Labs (all labs ordered are listed, but only abnormal results are displayed) Labs Reviewed - No data to display  EKG None  Radiology Dg Wrist Complete Right  Result Date: 10/11/2017 CLINICAL DATA:  Right wrist pain for the past few days.  No injury. EXAM: RIGHT WRIST - COMPLETE 3+ VIEW COMPARISON:  Right wrist x-rays dated May 20, 2014. FINDINGS: No acute fracture or dislocation. 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 Mild scaphotrapeziotrapezoid and radiocarpal osteoarthritis. Bone mineralization is normal. Soft tissues are unremarkable. IMPRESSION: 1.  No acute osseous abnormality. 2. 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 Electronically Signed   By: Titus Dubin M.D.   On: 10/11/2017 19:01    Procedures Procedures (including critical care time)  Medications Ordered in ED Medications  oxyCODONE-acetaminophen (PERCOCET/ROXICET) 5-325 MG per tablet 1 tablet (1 tablet Oral Given 10/11/17 1940)     Initial Impression / Assessment and Plan / ED Course  I have reviewed the triage vital signs and the nursing notes.  Pertinent labs & imaging results that were available during my care of the patient were reviewed by me and considered in my medical decision making (see chart for details).     On further hx after review of XR's, Pt recalls old wrist injury and being told by previous MD that his scaphoid bone "might die" pt reports no f/u due to no insurance.   Discussed XR findings with pt and importance of close orthopedic f/u.  He verbalized understanding.  Referral info and resources provided.    velcro wrist splint applied.  NV intact.    Final Clinical Impressions(s) / ED Diagnoses   Final diagnoses:  Wrist pain, right    Sprain of interphalangeal joint of right little finger, initial encounter    ED Discharge Orders        Ordered    oxyCODONE-acetaminophen (PERCOCET/ROXICET) 5-325 MG tablet  Every 4 hours PRN     10/11/17 1914       Kem Parkinson, PA-C 10/12/17 2054    Davonna Belling, MD 10/12/17 2348

## 2017-10-20 ENCOUNTER — Encounter (HOSPITAL_COMMUNITY): Payer: Self-pay | Admitting: *Deleted

## 2017-10-20 ENCOUNTER — Emergency Department (HOSPITAL_COMMUNITY)
Admission: EM | Admit: 2017-10-20 | Discharge: 2017-10-20 | Disposition: A | Discharge status: Home / Self Care | Payer: Medicaid Other | Attending: Emergency Medicine | Admitting: Emergency Medicine

## 2017-10-20 ENCOUNTER — Other Ambulatory Visit: Payer: Self-pay

## 2017-10-20 DIAGNOSIS — M109 Gout, unspecified: Secondary | ICD-10-CM

## 2017-10-20 DIAGNOSIS — Z79899 Other long term (current) drug therapy: Secondary | ICD-10-CM | POA: Diagnosis not present

## 2017-10-20 DIAGNOSIS — I10 Essential (primary) hypertension: Secondary | ICD-10-CM | POA: Diagnosis not present

## 2017-10-20 DIAGNOSIS — Z96649 Presence of unspecified artificial hip joint: Secondary | ICD-10-CM | POA: Insufficient documentation

## 2017-10-20 DIAGNOSIS — M25561 Pain in right knee: Secondary | ICD-10-CM | POA: Diagnosis present

## 2017-10-20 MED ORDER — OXYCODONE-ACETAMINOPHEN 5-325 MG PO TABS: 1.0000 | ORAL_TABLET | ORAL | 0 refills | Status: DC | PRN

## 2017-10-20 MED ORDER — PREDNISONE 20 MG PO TABS: ORAL_TABLET | ORAL | 0 refills | Status: DC

## 2017-10-20 MED ORDER — PREDNISONE 50 MG PO TABS
60.0000 mg | ORAL_TABLET | Freq: Once | ORAL | Status: AC
  Administered 2017-10-20: 60 mg via ORAL
  Filled 2017-10-20: qty 1

## 2017-10-20 NOTE — ED Notes (Signed)
Pt ambulatory to waiting room. Pt verbalized understanding of discharge instructions.   

## 2017-10-20 NOTE — ED Provider Notes (Signed)
[facility] EMERGENCY DEPARTMENT Provider Note   CSN: [id number] Arrival date & time: 10/20/17  0502     History   Chief Complaint Chief Complaint  Patient presents with  . Knee Pain    HPI Xavier Daniels is a 52 y.o. male.  Patient presents with pain and swelling of his right knee that has been going on for 3 days.  Patient reports a history of gout in the knee with similar symptoms.  He has not injured his knee in any way.  He has not had any fever.  Patient reports constant pain that is severe, worsens with movement.     Past Medical History:  Diagnosis Date  . Arthritis   . Gout   . Gout   . Hypertension   . Substance abuse ([place])    ETOH, 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, CLEAN FOR 13 YEARS    Patient Active Problem List   Diagnosis Date Noted  . Gout attack 06/20/2014  . Essential hypertension 06/06/2014  . Hyperlipidemia 06/06/2014  . Gout 06/06/2014  . Obesity 06/06/2014    Past Surgical History:  Procedure Laterality Date  . TOTAL HIP ARTHROPLASTY  2007        Home Medications    Prior to Admission medications   Medication Sig Start Date End Date Taking? Authorizing Provider  atenolol (TENORMIN) 100 MG tablet TAKE ONE (1) TABLET BY MOUTH EVERY DAY 10/08/16   [place], Modena Nunnery, MD  Colchicine (MITIGARE) 0.6 MG CAPS Take 1 capsule by mouth daily as needed. 06/25/16   [place], Modena Nunnery, MD  cyclobenzaprine (FLEXERIL) 10 MG tablet Take 1 tablet (10 mg total) by mouth 2 (two) times daily as needed for muscle spasms. 01/14/17   Nat Christen, MD  famotidine (PEPCID) 20 MG tablet Take 1 tablet (20 mg total) by mouth 2 (two) times daily. 04/28/17   Nat Christen, MD  HYDROcodone-acetaminophen (NORCO/VICODIN) 5-325 MG tablet Take one tab po q 4 hrs prn pain 09/29/17   Triplett, Tammy, PA-C  methocarbamol (ROBAXIN) 500 MG tablet Take 1 tablet (500 mg total) by mouth at bedtime as needed for muscle spasms. Patient not taking: Reported on 04/28/2017 12/22/16   Avie Echevaria B, PA-C   ondansetron (ZOFRAN ODT) 8 MG disintegrating tablet Take 1 tablet (8 mg total) by mouth every 8 (eight) hours as needed for nausea or vomiting. 04/28/17   Nat Christen, MD  oxyCODONE-acetaminophen (PERCOCET) 5-325 MG tablet Take 1 tablet by mouth every 4 (four) hours as needed. 10/20/17   Orpah Greek, MD  predniSONE (DELTASONE) 20 MG tablet 3 tabs po daily x 3 days, then 2 tabs x 3 days, then 1.5 tabs x 3 days, then 1 tab x 3 days, then 0.5 tabs x 3 days 10/20/17   Orpah Greek, MD    Family History Family History  Problem Relation Age of Onset  . Diabetes Mother   . Diabetes Brother     Social History Social History   Tobacco Use  . Smoking status: Never Smoker  . Smokeless tobacco: Never Used  Substance Use Topics  . Alcohol use: No  . Drug use: No     Allergies   Patient has no known allergies.   Review of Systems Review of Systems  Musculoskeletal: Positive for arthralgias.  All other systems reviewed and are negative.    Physical Exam Updated Vital Signs BP 124/82 (BP Location: Left Arm)   Pulse 86   Temp 98.3 F (36.8 C) (Oral)  Ht 6' (1.829 m)   Wt 108.9 kg (240 lb)   SpO2 96%   BMI 32.55 kg/m   Physical Exam  Constitutional: He appears well-developed and well-nourished.  HENT:  Head: Atraumatic.  Eyes: Pupils are equal, round, and reactive to light.  Cardiovascular: Normal rate.  Pulmonary/Chest: Effort normal.  Musculoskeletal:       Right knee: He exhibits swelling. He exhibits normal range of motion, no ecchymosis, no deformity and no erythema. Tenderness found.     ED Treatments / Results  Labs (all labs ordered are listed, but only abnormal results are displayed) Labs Reviewed - No data to display  EKG None  Radiology No results found.  Procedures Procedures (including critical care time)  Medications Ordered in ED Medications  predniSONE (DELTASONE) tablet 60 mg (has no administration in time range)      Initial Impression / Assessment and Plan / ED Course  I have reviewed the triage vital signs and the nursing notes.  Pertinent labs & imaging results that were available during my care of the patient were reviewed by me and considered in my medical decision making (see chart for details).     Patient has swelling of his right knee with warmth, no significant erythema.  He has normal range of motion.  Patient has had gout in the knee previously and he feels that it feels identical.  He is afebrile with normal vital signs.  Septic arthritis is not felt to be likely, will treat for gout.  Final Clinical Impressions(s) / ED Diagnoses   Final diagnoses:  Acute gout of right knee, unspecified cause    ED Discharge Orders        Ordered    predniSONE (DELTASONE) 20 MG tablet     10/20/17 0523    oxyCODONE-acetaminophen (PERCOCET) 5-325 MG tablet  Every 4 hours PRN     10/20/17 0523       Orpah Greek, MD 10/20/17 [phone]

## 2017-10-20 NOTE — ED Triage Notes (Signed)
Pt c/o right knee pain x 3 days; knee has swelling

## 2017-10-22 MED FILL — Oxycodone w/ Acetaminophen Tab 5-325 MG: ORAL | Qty: 6 | Status: AC

## 2017-11-03 ENCOUNTER — Emergency Department (HOSPITAL_COMMUNITY)
Admission: EM | Admit: 2017-11-03 | Discharge: 2017-11-03 | Disposition: A | Discharge status: Home / Self Care | Payer: Medicaid Other | Attending: Emergency Medicine | Admitting: Emergency Medicine

## 2017-11-03 ENCOUNTER — Encounter (HOSPITAL_COMMUNITY): Payer: Self-pay | Admitting: *Deleted

## 2017-11-03 ENCOUNTER — Other Ambulatory Visit: Payer: Self-pay

## 2017-11-03 DIAGNOSIS — I10 Essential (primary) hypertension: Secondary | ICD-10-CM | POA: Diagnosis not present

## 2017-11-03 DIAGNOSIS — M79674 Pain in right toe(s): Secondary | ICD-10-CM | POA: Diagnosis not present

## 2017-11-03 DIAGNOSIS — M109 Gout, unspecified: Secondary | ICD-10-CM | POA: Diagnosis not present

## 2017-11-03 DIAGNOSIS — Z79899 Other long term (current) drug therapy: Secondary | ICD-10-CM | POA: Insufficient documentation

## 2017-11-03 DIAGNOSIS — M7989 Other specified soft tissue disorders: Secondary | ICD-10-CM | POA: Diagnosis not present

## 2017-11-03 MED ORDER — INDOMETHACIN 50 MG PO CAPS: 50.0000 mg | ORAL_CAPSULE | Freq: Three times a day (TID) | ORAL | 1 refills | Status: DC

## 2017-11-03 MED ORDER — HYDROCODONE-ACETAMINOPHEN 5-325 MG PO TABS
2.0000 | ORAL_TABLET | Freq: Once | ORAL | Status: AC
  Administered 2017-11-03: 2 via ORAL
  Filled 2017-11-03: qty 2

## 2017-11-03 MED ORDER — HYDROCODONE-ACETAMINOPHEN 5-325 MG PO TABS: 1.0000 | ORAL_TABLET | Freq: Four times a day (QID) | ORAL | 0 refills | Status: DC | PRN

## 2017-11-03 MED ORDER — HYDROCODONE-ACETAMINOPHEN 5-325 MG PO TABS: 1.0000 | ORAL_TABLET | Freq: Four times a day (QID) | ORAL | Status: DC | PRN

## 2017-11-03 NOTE — ED Triage Notes (Signed)
Pt states he has gout in his right great toe

## 2017-11-03 NOTE — ED Provider Notes (Signed)
[facility] EMERGENCY DEPARTMENT Provider Note   CSN: [id number] Arrival date & time: 11/03/17  0105     History   Chief Complaint Chief Complaint  Patient presents with  . Gout    HPI Xavier Daniels is a 52 y.o. male.  Patient is a 52 year old male with past medical history of gout.  He presents with pain, redness, and swelling in his right first MTP joint.  This began in the absence of any injury or trauma and feels like a flareup of gout.  He denies any fevers or chills.  The history is provided by the patient.    Past Medical History:  Diagnosis Date  . Arthritis   . Gout   . Gout   . Hypertension   . Substance abuse ([place])    ETOH, 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, CLEAN FOR 13 YEARS    Patient Active Problem List   Diagnosis Date Noted  . Gout attack 06/20/2014  . Essential hypertension 06/06/2014  . Hyperlipidemia 06/06/2014  . Gout 06/06/2014  . Obesity 06/06/2014    Past Surgical History:  Procedure Laterality Date  . TOTAL HIP ARTHROPLASTY  2007        Home Medications    Prior to Admission medications   Medication Sig Start Date End Date Taking? Authorizing Provider  atenolol (TENORMIN) 100 MG tablet TAKE ONE (1) TABLET BY MOUTH EVERY DAY 10/08/16   [place], Modena Nunnery, MD  Colchicine (MITIGARE) 0.6 MG CAPS Take 1 capsule by mouth daily as needed. 06/25/16   [place], Modena Nunnery, MD  cyclobenzaprine (FLEXERIL) 10 MG tablet Take 1 tablet (10 mg total) by mouth 2 (two) times daily as needed for muscle spasms. 01/14/17   Nat Christen, MD  famotidine (PEPCID) 20 MG tablet Take 1 tablet (20 mg total) by mouth 2 (two) times daily. 04/28/17   Nat Christen, MD  HYDROcodone-acetaminophen (NORCO/VICODIN) 5-325 MG tablet Take 1-2 tablets by mouth every 6 (six) hours as needed. 11/03/17   Veryl Speak, MD  indomethacin (INDOCIN) 50 MG capsule Take 1 capsule (50 mg total) by mouth 3 (three) times daily with meals. 11/03/17   Veryl Speak, MD  methocarbamol (ROBAXIN) 500 MG tablet  Take 1 tablet (500 mg total) by mouth at bedtime as needed for muscle spasms. Patient not taking: Reported on 04/28/2017 12/22/16   Avie Echevaria B, PA-C  ondansetron (ZOFRAN ODT) 8 MG disintegrating tablet Take 1 tablet (8 mg total) by mouth every 8 (eight) hours as needed for nausea or vomiting. 04/28/17   Nat Christen, MD  oxyCODONE-acetaminophen (PERCOCET) 5-325 MG tablet Take 1 tablet by mouth every 4 (four) hours as needed. 10/20/17   Orpah Greek, MD  predniSONE (DELTASONE) 20 MG tablet 3 tabs po daily x 3 days, then 2 tabs x 3 days, then 1.5 tabs x 3 days, then 1 tab x 3 days, then 0.5 tabs x 3 days 10/20/17   Orpah Greek, MD    Family History Family History  Problem Relation Age of Onset  . Diabetes Mother   . Diabetes Brother     Social History Social History   Tobacco Use  . Smoking status: Never Smoker  . Smokeless tobacco: Never Used  Substance Use Topics  . Alcohol use: No  . Drug use: No     Allergies   Patient has no known allergies.   Review of Systems Review of Systems  All other systems reviewed and are negative.    Physical Exam Updated Vital Signs  BP (!) 147/83   Pulse 71   Temp 98.5 F (36.9 C) (Oral)   Resp 18   Ht 6' (1.829 m)   Wt 108.9 kg (240 lb)   SpO2 98%   BMI 32.55 kg/m   Physical Exam  Constitutional: He is oriented to person, place, and time. He appears well-developed and well-nourished. No distress.  HENT:  Head: Normocephalic and atraumatic.  Neck: Normal range of motion. Neck supple.  Pulmonary/Chest: Effort normal.  Musculoskeletal:  The right, first MTP joint is erythematous, warm to the touch, and has pain with range of motion.  Neurological: He is alert and oriented to person, place, and time.  Skin: He is not diaphoretic.  Nursing note and vitals reviewed.    ED Treatments / Results  Labs (all labs ordered are listed, but only abnormal results are displayed) Labs Reviewed - No data to  display  EKG None  Radiology No results found.  Procedures Procedures (including critical care time)  Medications Ordered in ED Medications  HYDROcodone-acetaminophen (NORCO/VICODIN) 5-325 MG per tablet 1-2 tablet (has no administration in time range)  HYDROcodone-acetaminophen (NORCO/VICODIN) 5-325 MG per tablet 2 tablet (2 tablets Oral Given 11/03/17 0346)     Initial Impression / Assessment and Plan / ED Course  I have reviewed the triage vital signs and the nursing notes.  Pertinent labs & imaging results that were available during my care of the patient were reviewed by me and considered in my medical decision making (see chart for details).  This appears to be a flareup of gout.  Will be treated with indomethacin, pain medicine, and follow-up as needed.  [place] was reviewed prior to prescribing.  Final Clinical Impressions(s) / ED Diagnoses   Final diagnoses:  Acute gout involving toe, unspecified cause, unspecified laterality    ED Discharge Orders        Ordered    indomethacin (INDOCIN) 50 MG capsule  3 times daily with meals     11/03/17 0349    HYDROcodone-acetaminophen (NORCO/VICODIN) 5-325 MG tablet  Every 6 hours PRN     11/03/17 0359       Veryl Speak, MD 11/03/17 [phone]

## 2017-11-03 NOTE — Discharge Instructions (Addendum)
Indomethacin as prescribed.  Hydrocodone as prescribed as needed for pain.  Follow-up with your primary doctor if not improving in the next week.

## 2017-11-07 ENCOUNTER — Other Ambulatory Visit: Payer: Self-pay | Admitting: Family Medicine

## 2017-11-12 DIAGNOSIS — M109 Gout, unspecified: Secondary | ICD-10-CM | POA: Diagnosis not present

## 2017-11-12 DIAGNOSIS — Z6821 Body mass index (BMI) 21.0-21.9, adult: Secondary | ICD-10-CM | POA: Diagnosis not present

## 2017-11-12 DIAGNOSIS — M25572 Pain in left ankle and joints of left foot: Secondary | ICD-10-CM | POA: Diagnosis not present

## 2017-12-01 DIAGNOSIS — H5201 Hypermetropia, right eye: Secondary | ICD-10-CM | POA: Diagnosis not present

## 2017-12-01 DIAGNOSIS — H52223 Regular astigmatism, bilateral: Secondary | ICD-10-CM | POA: Diagnosis not present

## 2017-12-01 DIAGNOSIS — H524 Presbyopia: Secondary | ICD-10-CM | POA: Diagnosis not present

## 2017-12-02 DIAGNOSIS — Z0000 Encounter for general adult medical examination without abnormal findings: Secondary | ICD-10-CM | POA: Diagnosis not present

## 2017-12-02 DIAGNOSIS — M109 Gout, unspecified: Secondary | ICD-10-CM | POA: Diagnosis not present

## 2017-12-02 DIAGNOSIS — R7301 Impaired fasting glucose: Secondary | ICD-10-CM | POA: Diagnosis not present

## 2017-12-02 DIAGNOSIS — Z6821 Body mass index (BMI) 21.0-21.9, adult: Secondary | ICD-10-CM | POA: Diagnosis not present

## 2017-12-02 DIAGNOSIS — E782 Mixed hyperlipidemia: Secondary | ICD-10-CM | POA: Diagnosis not present

## 2017-12-03 DIAGNOSIS — H5213 Myopia, bilateral: Secondary | ICD-10-CM | POA: Diagnosis not present

## 2017-12-23 DIAGNOSIS — H52223 Regular astigmatism, bilateral: Secondary | ICD-10-CM | POA: Diagnosis not present

## 2017-12-23 DIAGNOSIS — H5203 Hypermetropia, bilateral: Secondary | ICD-10-CM | POA: Diagnosis not present

## 2017-12-28 ENCOUNTER — Emergency Department (HOSPITAL_COMMUNITY)
Admission: EM | Admit: 2017-12-28 | Discharge: 2017-12-28 | Disposition: A | Discharge status: Home / Self Care | Payer: Medicaid Other | Attending: Emergency Medicine | Admitting: Emergency Medicine

## 2017-12-28 ENCOUNTER — Encounter (HOSPITAL_COMMUNITY): Payer: Self-pay | Admitting: *Deleted

## 2017-12-28 ENCOUNTER — Other Ambulatory Visit: Payer: Self-pay

## 2017-12-28 DIAGNOSIS — Z96649 Presence of unspecified artificial hip joint: Secondary | ICD-10-CM | POA: Diagnosis not present

## 2017-12-28 DIAGNOSIS — I10 Essential (primary) hypertension: Secondary | ICD-10-CM | POA: Insufficient documentation

## 2017-12-28 DIAGNOSIS — Z79899 Other long term (current) drug therapy: Secondary | ICD-10-CM | POA: Diagnosis not present

## 2017-12-28 DIAGNOSIS — M25512 Pain in left shoulder: Secondary | ICD-10-CM | POA: Diagnosis not present

## 2017-12-28 MED ORDER — OXYCODONE-ACETAMINOPHEN 5-325 MG PO TABS
1.0000 | ORAL_TABLET | Freq: Once | ORAL | Status: AC
  Administered 2017-12-28: 1 via ORAL
  Filled 2017-12-28: qty 1

## 2017-12-28 MED ORDER — KETOROLAC TROMETHAMINE 60 MG/2ML IM SOLN
60.0000 mg | Freq: Once | INTRAMUSCULAR | Status: AC
  Administered 2017-12-28: 60 mg via INTRAMUSCULAR
  Filled 2017-12-28: qty 2

## 2017-12-28 NOTE — ED Provider Notes (Signed)
[facility] EMERGENCY DEPARTMENT Provider Note   CSN: [id number] Arrival date & time: 12/28/17  0042     History   Chief Complaint Chief Complaint  Patient presents with  . Shoulder Pain    HPI Xavier Daniels is a 52 y.o. male.  Patient presents to the ER for evaluation of shoulder pain.  Patient reports that he has been experiencing severe left shoulder pain after lifting weights.  He reports that he was lifting very light weights, but did lift them above his head.  He has been having pain on the top part of his shoulder that causes him severe pain if he moves the shoulder.  No weakness in the upper extremities.  No numbness or tingling.     Past Medical History:  Diagnosis Date  . Arthritis   . Gout   . Gout   . Hypertension   . Substance abuse ([place])    ETOH, 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, CLEAN FOR 13 YEARS    Patient Active Problem List   Diagnosis Date Noted  . Gout attack 06/20/2014  . Essential hypertension 06/06/2014  . Hyperlipidemia 06/06/2014  . Gout 06/06/2014  . Obesity 06/06/2014    Past Surgical History:  Procedure Laterality Date  . TOTAL HIP ARTHROPLASTY  2007        Home Medications    Prior to Admission medications   Medication Sig Start Date End Date Taking? Authorizing Provider  atenolol (TENORMIN) 100 MG tablet TAKE ONE (1) TABLET BY MOUTH EVERY DAY 10/08/16  Yes [place], Modena Nunnery, MD  Colchicine (MITIGARE) 0.6 MG CAPS Take 1 capsule by mouth daily as needed. 06/25/16  Yes [place], Modena Nunnery, MD  HYDROcodone-acetaminophen (NORCO/VICODIN) 5-325 MG tablet Take 1-2 tablets by mouth every 6 (six) hours as needed. 11/03/17  Yes [place], Nathaneil Canary, MD  indomethacin (INDOCIN) 50 MG capsule Take 1 capsule (50 mg total) by mouth 3 (three) times daily with meals. 11/03/17  Yes [place], Nathaneil Canary, MD  methocarbamol (ROBAXIN) 500 MG tablet Take 1 tablet (500 mg total) by mouth at bedtime as needed for muscle spasms. 12/22/16  Yes Avie Echevaria B, PA-C  cyclobenzaprine  (FLEXERIL) 10 MG tablet Take 1 tablet (10 mg total) by mouth 2 (two) times daily as needed for muscle spasms. 01/14/17   Nat Christen, MD  famotidine (PEPCID) 20 MG tablet Take 1 tablet (20 mg total) by mouth 2 (two) times daily. 04/28/17   Nat Christen, MD  ondansetron (ZOFRAN ODT) 8 MG disintegrating tablet Take 1 tablet (8 mg total) by mouth every 8 (eight) hours as needed for nausea or vomiting. 04/28/17   Nat Christen, MD  oxyCODONE-acetaminophen (PERCOCET) 5-325 MG tablet Take 1 tablet by mouth every 4 (four) hours as needed. 10/20/17   Orpah Greek, MD  predniSONE (DELTASONE) 20 MG tablet 3 tabs po daily x 3 days, then 2 tabs x 3 days, then 1.5 tabs x 3 days, then 1 tab x 3 days, then 0.5 tabs x 3 days 10/20/17   Orpah Greek, MD    Family History Family History  Problem Relation Age of Onset  . Diabetes Mother   . Diabetes Brother     Social History Social History   Tobacco Use  . Smoking status: Never Smoker  . Smokeless tobacco: Never Used  Substance Use Topics  . Alcohol use: No  . Drug use: No     Allergies   Patient has no known allergies.   Review of Systems Review of Systems  Musculoskeletal: Positive for arthralgias.  All other systems reviewed and are negative.    Physical Exam Updated Vital Signs BP (!) 170/100   Pulse 97   Temp 98.4 F (36.9 C) (Oral)   Resp 18   Ht 6' (1.829 m)   Wt 108.9 kg   SpO2 100%   BMI 32.55 kg/m   Physical Exam  Constitutional: He is oriented to person, place, and time. He appears well-developed and well-nourished. No distress.  HENT:  Head: Normocephalic and atraumatic.  Right Ear: Hearing normal.  Left Ear: Hearing normal.  Nose: Nose normal.  Mouth/Throat: Oropharynx is clear and moist and mucous membranes are normal.  Eyes: Pupils are equal, round, and reactive to light. Conjunctivae and EOM are normal.  Neck: Normal range of motion. Neck supple.  Cardiovascular: Regular rhythm, S1 normal and S2  normal. Exam reveals no gallop and no friction rub.  No murmur heard. Pulmonary/Chest: Effort normal and breath sounds normal. No respiratory distress. He exhibits no tenderness.  Abdominal: Soft. Normal appearance and bowel sounds are normal. There is no hepatosplenomegaly. There is no tenderness. There is no rebound, no guarding, no tenderness at McBurney's point and negative Murphy's sign. No hernia.  Musculoskeletal:       Left shoulder: He exhibits decreased range of motion (Due to pain), tenderness (Trapezius) and spasm (Trapezius). He exhibits no swelling and no deformity.  Neurological: He is alert and oriented to person, place, and time. He has normal strength. No cranial nerve deficit or sensory deficit. Coordination normal. GCS eye subscore is 4. GCS verbal subscore is 5. GCS motor subscore is 6.  Normal sensation at deltoid, normal sensation distal arm including all 3 nerves across and distribution with normal abduction, abduction, opposition, flexion, extension  Skin: Skin is warm, dry and intact. No rash noted. No cyanosis.  Psychiatric: He has a normal mood and affect. His speech is normal and behavior is normal. Thought content normal.  Nursing note and vitals reviewed.    ED Treatments / Results  Labs (all labs ordered are listed, but only abnormal results are displayed) Labs Reviewed - No data to display  EKG None  Radiology No results found.  Procedures Procedures (including critical care time)  Medications Ordered in ED Medications  ketorolac (TORADOL) injection 60 mg (has no administration in time range)  oxyCODONE-acetaminophen (PERCOCET/ROXICET) 5-325 MG per tablet 1 tablet (has no administration in time range)     Initial Impression / Assessment and Plan / ED Course  I have reviewed the triage vital signs and the nursing notes.  Pertinent labs & imaging results that were available during my care of the patient were reviewed by me and considered in my  medical decision making (see chart for details).     Examination shows musculoskeletal pain secondary to spasm of trapezius muscle.  Range of motion is inhibited secondary to pain, but no deformity.  Administered analgesia here in the ER.  Checking narcotic database reveals 6 prescriptions for oxycodone and/or hydrocodone since June 17.  No additional narcotics provided.  Final Clinical Impressions(s) / ED Diagnoses   Final diagnoses:  Acute pain of left shoulder    ED Discharge Orders    None       [place], Gwenyth Allegra, MD 12/28/17 [phone]

## 2017-12-28 NOTE — ED Triage Notes (Signed)
Pt states that he was lifting weights a week ago and left shoulder had been sore but pain has gotten worse tonight, positive radial pulse noted,

## 2017-12-29 DIAGNOSIS — M25512 Pain in left shoulder: Secondary | ICD-10-CM | POA: Diagnosis not present

## 2017-12-29 DIAGNOSIS — Z6821 Body mass index (BMI) 21.0-21.9, adult: Secondary | ICD-10-CM | POA: Diagnosis not present

## 2018-01-08 ENCOUNTER — Ambulatory Visit (INDEPENDENT_AMBULATORY_CARE_PROVIDER_SITE_OTHER): Payer: Self-pay

## 2018-01-08 DIAGNOSIS — Z1211 Encounter for screening for malignant neoplasm of colon: Secondary | ICD-10-CM

## 2018-01-08 MED ORDER — NA SULFATE-K SULFATE-MG SULF 17.5-3.13-1.6 GM/177ML PO SOLN: 1.0000 | ORAL | 0 refills | Status: DC

## 2018-01-08 NOTE — Progress Notes (Signed)
Gastroenterology Pre-Procedure Review  Request Date:01/08/18 Requesting Physician: Pablo Lawrence NP - no previous tcs  PATIENT REVIEW QUESTIONS: The patient responded to the following health history questions as indicated:    1. Diabetes Melitis: no 2. Joint replacements in the past 12 months: no 3. Major health problems in the past 3 months: no 4. Has an artificial valve or MVP: no 5. Has a defibrillator: no 6. Has been advised in past to take antibiotics in advance of a procedure like teeth cleaning: no 7. Family history of colon cancer: no  8. Alcohol Use: no 9. History of sleep apnea: no  10. History of coronary artery or other vascular stents placed within the last 12 months: no 11. History of any prior anesthesia complications: no    MEDICATIONS & ALLERGIES:    Patient reports the following regarding taking any blood thinners:   Plavix? no Aspirin? no Coumadin? no Brilinta? no Xarelto? no Eliquis? no Pradaxa? no Savaysa? no Effient? no  Patient confirms/reports the following medications:  Current Outpatient Medications  Medication Sig Dispense Refill  . atenolol (TENORMIN) 100 MG tablet TAKE ONE (1) TABLET BY MOUTH EVERY DAY 30 tablet 3  . HYDROcodone-acetaminophen (NORCO/VICODIN) 5-325 MG tablet Take 1-2 tablets by mouth every 6 (six) hours as needed. 12 tablet 0  . indomethacin (INDOCIN) 50 MG capsule Take 1 capsule (50 mg total) by mouth 3 (three) times daily with meals. 21 capsule 1  . methocarbamol (ROBAXIN) 500 MG tablet Take 1 tablet (500 mg total) by mouth at bedtime as needed for muscle spasms. 20 tablet 0   No current facility-administered medications for this visit.     Patient confirms/reports the following allergies:  No Known Allergies  No orders of the defined types were placed in this encounter.   AUTHORIZATION INFORMATION Primary Insurance: Nunzio Cobbs,  ID #: [id number] p Pre-Cert / Josem Kaufmann required: no   SCHEDULE INFORMATION: Procedure has  been scheduled as follows:  Date: 03/23/18, Time: 9:30 Location: [facility] [address]  This Gastroenterology Pre-Precedure Review Form is being routed to the following provider(s): Neil Crouch, PA

## 2018-01-08 NOTE — Progress Notes (Signed)
Pt stated he only takes hydrocodone when he has a "gout flare up".

## 2018-01-08 NOTE — Patient Instructions (Signed)
Xavier Daniels  1966-04-04 MRN: [id number]     Procedure Date: 03/23/18 Time to register: 8:30am Place to register: [facility] Procedure Time: 9:30am Scheduled provider: Barney Drain, MD    PREPARATION FOR COLONOSCOPY WITH SUPREP BOWEL PREP KIT  Note: Suprep Bowel Prep Kit is a split-dose (2day) regimen. Consumption of BOTH 6-ounce bottles is required for a complete prep.  Please notify us immediately if you are diabetic, take iron supplements, or if you are on Coumadin or any other blood thinners.                                                                                                                                                 1 DAY BEFORE PROCEDURE:  DATE: 03/22/18   DAY: Sunday  clear liquids the entire day - NO SOLID FOOD.   At 6:00pm: Complete steps 1 through 4 below, using ONE (1) 6-ounce bottle, before going to bed. Step 1:  Pour ONE (1) 6-ounce bottle of SUPREP liquid into the mixing container.  Step 2:  Add cool drinking water to the 16 ounce line on the container and mix.  Note: Dilute the solution concentrate as directed prior to use. Step 3:  DRINK ALL the liquid in the container. Step 4:  You MUST drink an additional two (2) or more 16 ounce containers of water over the next one (1) hour.   Continue clear liquids.  DAY OF PROCEDURE:   DATE: 03/23/18   DAY: Monday If you take medications for your heart, blood pressure, or breathing, you may take these medications.    5 hours before your procedure at :4:30am Step 1:  Pour ONE (1) 6-ounce bottle of SUPREP liquid into the mixing container.  Step 2:  Add cool drinking water to the 16 ounce line on the container and mix.  Note: Dilute the solution concentrate as directed prior to use. Step 3:  DRINK ALL the liquid in the container. Step 4:  You MUST drink an additional two (2) or more 16 ounce containers of water over the next one (1) hour. You MUST complete the final glass of water at least 3 hours  before your colonoscopy.   Nothing by mouth past 6:30am  You may take your morning medications with sip of water unless we have instructed otherwise.    Please see below for Dietary Information.  CLEAR LIQUIDS INCLUDE:  Water Jello (NOT red in color)   Ice Popsicles (NOT red in color)   Tea (sugar ok, no milk/cream) Powdered fruit flavored drinks  Coffee (sugar ok, no milk/cream) Gatorade/ Lemonade/ Kool-Aid  (NOT red in color)   Juice: apple, white grape, white cranberry Soft drinks  Clear bullion, consomme, broth (fat free beef/chicken/vegetable)  Carbonated beverages (any kind)  Strained chicken noodle soup Hard Candy   Remember: Clear liquids are liquids that  will allow you to see your fingers on the other side of a clear glass. Be sure liquids are NOT red in color, and not cloudy, but CLEAR.  DO NOT EAT OR DRINK ANY OF THE FOLLOWING:  Dairy products of any kind   Cranberry juice Tomato juice / V8 juice   Grapefruit juice Orange juice     Red grape juice  Do not eat any solid foods, including such foods as: cereal, oatmeal, yogurt, fruits, vegetables, creamed soups, eggs, bread, crackers, pureed foods in a blender, etc.   HELPFUL HINTS FOR DRINKING PREP SOLUTION:   Make sure prep is extremely cold. Mix and refrigerate the the morning of the prep. You may also put in the freezer.   You may try mixing some Crystal Light or Country Time Lemonade if you prefer. Mix in small amounts; add more if necessary.  Try drinking through a straw  Rinse mouth with water or a mouthwash between glasses, to remove after-taste.  Try sipping on a cold beverage /ice/ popsicles between glasses of prep.  Place a piece of sugar-free hard candy in mouth between glasses.  If you become nauseated, try consuming smaller amounts, or stretch out the time between glasses. Stop for 30-60 minutes, then slowly start back drinking.     OTHER INSTRUCTIONS  You will need a responsible adult at least  52 years of age to accompany you and drive you home. This person must remain in the waiting room during your procedure. The hospital will cancel your procedure if you do not have a responsible adult with you.   1. Wear loose fitting clothing that is easily removed. 2. Leave jewelry and other valuables at home.  3. Remove all body piercing jewelry and leave at home. 4. Total time from sign-in until discharge is approximately 2-3 hours. 5. You should go home directly after your procedure and rest. You can resume normal activities the day after your procedure. 6. The day of your procedure you should not:  Drive  Make legal decisions  Operate machinery  Drink alcohol  Return to work   You may call the office (Dept: [phone]) before 5:00pm, or page the doctor on call [phone]) after 5:00pm, for further instructions, if necessary.   Insurance Information YOU WILL NEED TO CHECK WITH YOUR INSURANCE COMPANY FOR THE BENEFITS OF COVERAGE YOU HAVE FOR THIS PROCEDURE.  UNFORTUNATELY, NOT ALL INSURANCE COMPANIES HAVE BENEFITS TO COVER ALL OR PART OF THESE TYPES OF PROCEDURES.  IT IS YOUR RESPONSIBILITY TO CHECK YOUR BENEFITS, HOWEVER, WE WILL BE GLAD TO ASSIST YOU WITH ANY CODES YOUR INSURANCE COMPANY MAY NEED.    PLEASE NOTE THAT MOST INSURANCE COMPANIES WILL NOT COVER A SCREENING COLONOSCOPY FOR PEOPLE UNDER THE AGE OF 50  IF YOU HAVE BCBS INSURANCE, YOU MAY HAVE BENEFITS FOR A SCREENING COLONOSCOPY BUT IF POLYPS ARE FOUND THE DIAGNOSIS WILL CHANGE AND THEN YOU MAY HAVE A DEDUCTIBLE THAT WILL NEED TO BE MET. SO PLEASE MAKE SURE YOU CHECK YOUR BENEFITS FOR A SCREENING COLONOSCOPY AS WELL AS A DIAGNOSTIC COLONOSCOPY.

## 2018-01-12 DIAGNOSIS — Z23 Encounter for immunization: Secondary | ICD-10-CM | POA: Diagnosis not present

## 2018-01-12 NOTE — Progress Notes (Signed)
Please schedule ov and I will mail him a letter.  

## 2018-01-12 NOTE — Progress Notes (Signed)
He has had 6 RX for oxycodone or hydrocodone since 09/2017. Per PSH, h/o polysubstance abuse.   He needs proprofol.

## 2018-01-14 NOTE — Progress Notes (Signed)
Letter mailed to the pt with OV date and time.

## 2018-01-21 NOTE — Progress Notes (Signed)
I have taken him off the schedule and [facility]

## 2018-01-29 ENCOUNTER — Telehealth: Payer: Self-pay | Admitting: Internal Medicine

## 2018-01-29 DIAGNOSIS — K047 Periapical abscess without sinus: Secondary | ICD-10-CM | POA: Diagnosis not present

## 2018-01-29 DIAGNOSIS — Z6832 Body mass index (BMI) 32.0-32.9, adult: Secondary | ICD-10-CM | POA: Diagnosis not present

## 2018-01-29 NOTE — Telephone Encounter (Signed)
Pt called [facility] asking for the date of his first visit (06/06/14) and the date of his last visit (06/24/16) as a patient at the clinic.

## 2018-03-10 DIAGNOSIS — M109 Gout, unspecified: Secondary | ICD-10-CM | POA: Diagnosis not present

## 2018-03-10 DIAGNOSIS — R42 Dizziness and giddiness: Secondary | ICD-10-CM | POA: Diagnosis not present

## 2018-03-10 DIAGNOSIS — R21 Rash and other nonspecific skin eruption: Secondary | ICD-10-CM | POA: Diagnosis not present

## 2018-03-10 DIAGNOSIS — I10 Essential (primary) hypertension: Secondary | ICD-10-CM | POA: Diagnosis not present

## 2018-03-16 DIAGNOSIS — I10 Essential (primary) hypertension: Secondary | ICD-10-CM | POA: Diagnosis not present

## 2018-03-23 ENCOUNTER — Ambulatory Visit (HOSPITAL_COMMUNITY): Admit: 2018-03-23 | Payer: Medicaid Other | Admitting: Gastroenterology

## 2018-03-23 ENCOUNTER — Encounter (HOSPITAL_COMMUNITY): Payer: Self-pay

## 2018-03-23 SURGERY — COLONOSCOPY
Anesthesia: Moderate Sedation

## 2018-04-01 DIAGNOSIS — I10 Essential (primary) hypertension: Secondary | ICD-10-CM | POA: Diagnosis not present

## 2018-04-01 DIAGNOSIS — Z8739 Personal history of other diseases of the musculoskeletal system and connective tissue: Secondary | ICD-10-CM | POA: Diagnosis not present

## 2018-04-01 DIAGNOSIS — R42 Dizziness and giddiness: Secondary | ICD-10-CM | POA: Diagnosis not present

## 2018-05-03 DIAGNOSIS — Z96649 Presence of unspecified artificial hip joint: Secondary | ICD-10-CM | POA: Diagnosis not present

## 2018-05-03 DIAGNOSIS — I10 Essential (primary) hypertension: Secondary | ICD-10-CM | POA: Diagnosis not present

## 2018-05-03 DIAGNOSIS — M545 Low back pain: Secondary | ICD-10-CM | POA: Diagnosis not present

## 2018-05-03 DIAGNOSIS — Z79899 Other long term (current) drug therapy: Secondary | ICD-10-CM | POA: Insufficient documentation

## 2018-05-04 ENCOUNTER — Encounter (HOSPITAL_COMMUNITY): Payer: Self-pay | Admitting: *Deleted

## 2018-05-04 ENCOUNTER — Emergency Department (HOSPITAL_COMMUNITY)
Admission: EM | Admit: 2018-05-04 | Discharge: 2018-05-04 | Disposition: A | Discharge status: Home / Self Care | Payer: Medicaid Other | Attending: Emergency Medicine | Admitting: Emergency Medicine

## 2018-05-04 ENCOUNTER — Other Ambulatory Visit: Payer: Self-pay

## 2018-05-04 DIAGNOSIS — M545 Low back pain, unspecified: Secondary | ICD-10-CM

## 2018-05-04 MED ORDER — METHOCARBAMOL 500 MG PO TABS: 500.0000 mg | ORAL_TABLET | Freq: Two times a day (BID) | ORAL | 0 refills | Status: DC

## 2018-05-04 MED ORDER — HYDROCODONE-ACETAMINOPHEN 5-325 MG PO TABS: 1.0000 | ORAL_TABLET | Freq: Four times a day (QID) | ORAL | 0 refills | Status: DC | PRN

## 2018-05-04 MED ORDER — LIDOCAINE 5 % EX PTCH: 1.0000 | MEDICATED_PATCH | CUTANEOUS | 0 refills | Status: DC

## 2018-05-04 MED ORDER — KETOROLAC TROMETHAMINE 60 MG/2ML IM SOLN
60.0000 mg | Freq: Once | INTRAMUSCULAR | Status: AC
  Administered 2018-05-04: 60 mg via INTRAMUSCULAR
  Filled 2018-05-04: qty 2

## 2018-05-04 NOTE — Discharge Instructions (Addendum)
Expect your soreness to increase over the next 2-3 days. Take it easy, but do not lay around too much as this may make any stiffness worse.  Antiinflammatory medications: Take 600 mg of ibuprofen every 6 hours or 440 mg (over the counter dose) to 500 mg (prescription dose) of naproxen every 12 hours for the next 3 days. After this time, these medications may be used as needed for pain. Take these medications with food to avoid upset stomach. Choose only one of these medications, do not take them together. Acetaminophen (generic for Tylenol): Should you continue to have additional pain while taking the ibuprofen or naproxen, you may add in acetaminophen as needed. Your daily total maximum amount of acetaminophen from all sources should be limited to 4000mg /day for persons without liver problems, or 2000mg /day for those with liver problems. Vicodin: May take Vicodin (hydrocodone-acetaminophen) as needed for severe pain.  Do not drive or perform other dangerous activities while taking the Vicodin.  Please note that each pill of Vicodin contains 325 mg of acetaminophen (Tylenol) and the above dosage limits apply. Muscle relaxer: Robaxin is a muscle relaxer and may help loosen stiff muscles. Do not take the Robaxin while driving or performing other dangerous activities.  Lidocaine patches: These are available via either prescription or over-the-counter. The over-the-counter option may be more economical one and are likely just as effective. There are multiple over-the-counter brands, such as Salonpas. Exercises: Be sure to perform the attached exercises starting with three times a week and working up to performing them daily. This is an essential part of preventing long term problems.  Follow up: Follow up with a primary care provider for any future management of these complaints. Be sure to follow up within 7-10 days. Return: Return to the ED should symptoms worsen.  For prescription assistance, may try using  prescription discount sites or apps, such as goodrx.com

## 2018-05-04 NOTE — ED Provider Notes (Signed)
[facility] EMERGENCY DEPARTMENT Provider Note   CSN: [id number] Arrival date & time: 05/03/18  2349     History   Chief Complaint Chief Complaint  Patient presents with  . Back Pain    HPI Xavier Daniels is a 53 y.o. male.  HPI   Xavier Daniels is a 53 y.o. male, with a history of gout, arthritis, and HTN, presenting to the ED with right lower back pain beginning today.  States he was wrestling around with his son last night and woke up with pain in the back this morning.  Laid down for a nap, but when he woke up his pain and stiffness was worse. Pain is described as a tightness and stiffness, moderate to severe, nonradiating.  Patient drove himself to the ED. Denies numbness, weakness, changes in bowel or bladder function, saddle anesthesias, trauma, or any other complaints.   Past Medical History:  Diagnosis Date  . Arthritis   . Gout   . Gout   . Hypertension   . Substance abuse ([place])    ETOH, 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, CLEAN FOR 13 YEARS    Patient Active Problem List   Diagnosis Date Noted  . Gout attack 06/20/2014  . Essential hypertension 06/06/2014  . Hyperlipidemia 06/06/2014  . Gout 06/06/2014  . Obesity 06/06/2014    Past Surgical History:  Procedure Laterality Date  . TOTAL HIP ARTHROPLASTY  2007        Home Medications    Prior to Admission medications   Medication Sig Start Date End Date Taking? Authorizing Provider  atenolol (TENORMIN) 100 MG tablet TAKE ONE (1) TABLET BY MOUTH EVERY DAY 10/08/16  Yes Goodhue, Modena Nunnery, MD  HYDROcodone-acetaminophen (NORCO/VICODIN) 5-325 MG tablet Take 1-2 tablets by mouth every 6 (six) hours as needed for severe pain. 05/04/18   Mystie Ormand C, PA-C  indomethacin (INDOCIN) 50 MG capsule Take 1 capsule (50 mg total) by mouth 3 (three) times daily with meals. Patient taking differently: Take 50 mg by mouth 3 (three) times daily as needed.  11/03/17   Veryl Speak, MD  lidocaine (LIDODERM) 5 % Place 1 patch onto the  skin daily. Remove & Discard patch within 12 hours or as directed by MD 05/04/18   Robinson Brinkley C, PA-C  methocarbamol (ROBAXIN) 500 MG tablet Take 1 tablet (500 mg total) by mouth 2 (two) times daily. 05/04/18   Yahira Timberman C, PA-C  Na Sulfate-K Sulfate-Mg Sulf (SUPREP BOWEL PREP KIT) 17.5-3.13-1.6 GM/177ML SOLN Take 1 kit by mouth as directed. 01/08/18   Mahala Menghini, PA-C    Family History Family History  Problem Relation Age of Onset  . Diabetes Mother   . Diabetes Brother     Social History Social History   Tobacco Use  . Smoking status: Never Smoker  . Smokeless tobacco: Never Used  Substance Use Topics  . Alcohol use: No  . Drug use: No     Allergies   Patient has no known allergies.   Review of Systems Review of Systems  Cardiovascular: Negative for chest pain.  Gastrointestinal: Negative for abdominal pain, nausea and vomiting.  Genitourinary: Negative for difficulty urinating.  Musculoskeletal: Positive for back pain.  Neurological: Negative for weakness and numbness.     Physical Exam Updated Vital Signs BP (!) 156/92   Pulse 89   Temp 99.4 F (37.4 C) (Oral)   Resp 16   Ht 6' (1.829 m)   Wt 108.9 kg   SpO2 97%  BMI 32.55 kg/m   Physical Exam Vitals signs and nursing note reviewed.  Constitutional:      General: He is not in acute distress.    Appearance: He is well-developed. He is not diaphoretic.  HENT:     Head: Normocephalic and atraumatic.  Eyes:     Conjunctiva/sclera: Conjunctivae normal.  Neck:     Musculoskeletal: Neck supple.  Cardiovascular:     Rate and Rhythm: Normal rate and regular rhythm.     Pulses: Normal pulses.          Posterior tibial pulses are 2+ on the right side and 2+ on the left side.  Pulmonary:     Effort: Pulmonary effort is normal.  Abdominal:     Palpations: Abdomen is soft.     Tenderness: There is no abdominal tenderness. There is no guarding.  Musculoskeletal:     Lumbar back: He exhibits tenderness.        Back:     Comments: Normal motor function intact in all extremities. No midline spinal tenderness.   Skin:    General: Skin is warm and dry.     Coloration: Skin is not pale.  Neurological:     Mental Status: He is alert.     Comments: Sensation grossly intact to light touch in the lower extremities bilaterally. No saddle anesthesias. Strength 5/5 in the bilateral lower extremities. Slow, antalgic gait, but can ambulate without assistance.  Psychiatric:        Behavior: Behavior normal.      ED Treatments / Results  Labs (all labs ordered are listed, but only abnormal results are displayed) Labs Reviewed - No data to display  EKG None  Radiology No results found.  Procedures Procedures (including critical care time)  Medications Ordered in ED Medications  ketorolac (TORADOL) injection 60 mg (has no administration in time range)     Initial Impression / Assessment and Plan / ED Course  I have reviewed the triage vital signs and the nursing notes.  Pertinent labs & imaging results that were available during my care of the patient were reviewed by me and considered in my medical decision making (see chart for details).     Patient presents with right lower back pain.  Patient has a mechanism for muscular strain and physical exam supports this possibility.  No focal neuro deficits.  No red flag symptoms. The patient was given instructions for home care as well as return precautions. Patient voices understanding of these instructions, accepts the plan, and is comfortable with discharge.  I discussed with the patient the lack of 24-hour pharmacies in this area and if he wanted to pick up his medications tonight, we would need to send his prescriptions to the 24-hour pharmacy in [place]. We also discussed the possibility that because tomorrow is a holiday, his preferred local pharmacy may be closed.  Patient declined having his medication sent to any other  pharmacy.  Final Clinical Impressions(s) / ED Diagnoses   Final diagnoses:  Acute right-sided low back pain without sciatica    ED Discharge Orders         Ordered    HYDROcodone-acetaminophen (NORCO/VICODIN) 5-325 MG tablet  Every 6 hours PRN     05/04/18 0029    methocarbamol (ROBAXIN) 500 MG tablet  2 times daily     05/04/18 0029    lidocaine (LIDODERM) 5 %  Every 24 hours     05/04/18 0029  Lorayne Bender, PA-C 05/04/18 0039    Ripley Fraise, MD 05/04/18 0120

## 2018-05-04 NOTE — ED Triage Notes (Signed)
Pt c./o lower back pain that started today, denies any urinary problems, denies any radiation of pain, denies any injury, pain is worse with certain positions and movements

## 2018-05-06 DIAGNOSIS — M545 Low back pain: Secondary | ICD-10-CM | POA: Diagnosis not present

## 2018-05-06 DIAGNOSIS — R252 Cramp and spasm: Secondary | ICD-10-CM | POA: Diagnosis not present

## 2018-05-08 DIAGNOSIS — M5430 Sciatica, unspecified side: Secondary | ICD-10-CM | POA: Diagnosis not present

## 2018-05-08 DIAGNOSIS — M545 Low back pain: Secondary | ICD-10-CM | POA: Diagnosis not present

## 2018-05-08 DIAGNOSIS — R7301 Impaired fasting glucose: Secondary | ICD-10-CM | POA: Diagnosis not present

## 2018-05-08 DIAGNOSIS — I10 Essential (primary) hypertension: Secondary | ICD-10-CM | POA: Diagnosis not present

## 2018-05-08 DIAGNOSIS — E782 Mixed hyperlipidemia: Secondary | ICD-10-CM | POA: Diagnosis not present

## 2018-05-08 DIAGNOSIS — M109 Gout, unspecified: Secondary | ICD-10-CM | POA: Diagnosis not present

## 2018-05-08 DIAGNOSIS — M79672 Pain in left foot: Secondary | ICD-10-CM | POA: Diagnosis not present

## 2018-05-13 ENCOUNTER — Ambulatory Visit: Payer: Medicaid Other | Admitting: Gastroenterology

## 2018-05-20 ENCOUNTER — Encounter (HOSPITAL_COMMUNITY): Payer: Self-pay | Admitting: *Deleted

## 2018-05-20 ENCOUNTER — Other Ambulatory Visit: Payer: Self-pay

## 2018-05-20 ENCOUNTER — Emergency Department (HOSPITAL_COMMUNITY)
Admission: EM | Admit: 2018-05-20 | Discharge: 2018-05-20 | Disposition: A | Discharge status: Home / Self Care | Payer: Medicaid Other | Attending: Emergency Medicine | Admitting: Emergency Medicine

## 2018-05-20 ENCOUNTER — Emergency Department (HOSPITAL_COMMUNITY): Payer: Medicaid Other

## 2018-05-20 DIAGNOSIS — M109 Gout, unspecified: Secondary | ICD-10-CM

## 2018-05-20 DIAGNOSIS — Z79899 Other long term (current) drug therapy: Secondary | ICD-10-CM | POA: Insufficient documentation

## 2018-05-20 DIAGNOSIS — M10062 Idiopathic gout, left knee: Secondary | ICD-10-CM | POA: Diagnosis not present

## 2018-05-20 DIAGNOSIS — I10 Essential (primary) hypertension: Secondary | ICD-10-CM | POA: Insufficient documentation

## 2018-05-20 DIAGNOSIS — M25562 Pain in left knee: Secondary | ICD-10-CM | POA: Diagnosis not present

## 2018-05-20 MED ORDER — OXYCODONE-ACETAMINOPHEN 5-325 MG PO TABS
2.0000 | ORAL_TABLET | Freq: Once | ORAL | Status: AC
  Administered 2018-05-20: 2 via ORAL
  Filled 2018-05-20: qty 2

## 2018-05-20 MED ORDER — INDOMETHACIN 25 MG PO CAPS: ORAL_CAPSULE | ORAL | 0 refills | Status: DC

## 2018-05-20 NOTE — Discharge Instructions (Signed)
Take the prescription as directed.  Apply moist heat or ice to the area(s) of discomfort, for 15 minutes at a time, several times per day for the next few days.  Do not fall asleep on a heating or ice pack. Walk with your crutches for comfort. Call your regular medical doctor today to schedule a follow up appointment this week.  Return to the Emergency Department immediately if worsening.

## 2018-05-20 NOTE — ED Provider Notes (Signed)
[facility] EMERGENCY DEPARTMENT Provider Note   CSN: [id number] Arrival date & time: 05/20/18  4356     History   Chief Complaint Chief Complaint  Patient presents with  . Knee Pain    HPI Xavier Daniels is a 53 y.o. male.  HPI  Pt was seen at [facility]. Per pt, c/o gradual onset and persistence of constant acute flair of his chronic left knee pain for the past 2 to 3 days. Pt describes his pain as per his usual gout pain pattern. Pt has been evaluated by his PMD multiple times for this complaint, most recently 1 week ago. Pt states he was started on allopurinol and took a course of prednisone. Denies fevers, no injury, no focal motor weakness, no tingling/numbness in extremities, no rash, no back pain.    Past Medical History:  Diagnosis Date  . Arthritis   . Gout   . Gout   . Hypertension   . Substance abuse ([place])    ETOH, 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, CLEAN FOR 13 YEARS    Patient Active Problem List   Diagnosis Date Noted  . Gout attack 06/20/2014  . Essential hypertension 06/06/2014  . Hyperlipidemia 06/06/2014  . Gout 06/06/2014  . Obesity 06/06/2014    Past Surgical History:  Procedure Laterality Date  . TOTAL HIP ARTHROPLASTY  2007        Home Medications    Prior to Admission medications   Medication Sig Start Date End Date Taking? Authorizing Provider  atenolol (TENORMIN) 100 MG tablet TAKE ONE (1) TABLET BY MOUTH EVERY DAY 10/08/16   Alycia Rossetti, MD  HYDROcodone-acetaminophen (NORCO/VICODIN) 5-325 MG tablet Take 1-2 tablets by mouth every 6 (six) hours as needed for severe pain. 05/04/18   Joy, Shawn C, PA-C  indomethacin (INDOCIN) 50 MG capsule Take 1 capsule (50 mg total) by mouth 3 (three) times daily with meals. Patient taking differently: Take 50 mg by mouth 3 (three) times daily as needed.  11/03/17   Veryl Speak, MD  lidocaine (LIDODERM) 5 % Place 1 patch onto the skin daily. Remove & Discard patch within 12 hours or as directed by MD 05/04/18   Joy,  Shawn C, PA-C  methocarbamol (ROBAXIN) 500 MG tablet Take 1 tablet (500 mg total) by mouth 2 (two) times daily. 05/04/18   Joy, Shawn C, PA-C  Na Sulfate-K Sulfate-Mg Sulf (SUPREP BOWEL PREP KIT) 17.5-3.13-1.6 GM/177ML SOLN Take 1 kit by mouth as directed. 01/08/18   Mahala Menghini, PA-C    Family History Family History  Problem Relation Age of Onset  . Diabetes Mother   . Diabetes Brother     Social History Social History   Tobacco Use  . Smoking status: Never Smoker  . Smokeless tobacco: Never Used  Substance Use Topics  . Alcohol use: No  . Drug use: No     Allergies   Patient has no known allergies.   Review of Systems Review of Systems ROS: Statement: All systems negative except as marked or noted in the HPI; Constitutional: Negative for fever and chills. ; ; Eyes: Negative for eye pain, redness and discharge. ; ; ENMT: Negative for ear pain, hoarseness, nasal congestion, sinus pressure and sore throat. ; ; Cardiovascular: Negative for chest pain, palpitations, diaphoresis, dyspnea and peripheral edema. ; ; Respiratory: Negative for cough, wheezing and stridor. ; ; Gastrointestinal: Negative for nausea, vomiting, diarrhea, abdominal pain, blood in stool, hematemesis, jaundice and rectal bleeding. . ; ; Genitourinary: Negative for dysuria,  flank pain and hematuria. ; ; Musculoskeletal: +left knee pain. Negative for back pain and neck pain. Negative for deformity and trauma.; ; Skin: Negative for pruritus, rash, abrasions, blisters, bruising and skin lesion.; ; Neuro: Negative for headache, lightheadedness and neck stiffness. Negative for weakness, altered level of consciousness, altered mental status, extremity weakness, paresthesias, involuntary movement, seizure and syncope.       Physical Exam Updated Vital Signs BP (!) 140/98 (BP Location: Right Arm)   Pulse 98   Temp 98.1 F (36.7 C) (Oral)   Resp 18   Ht 6' (1.829 m)   Wt 108 kg   SpO2 98%   BMI 32.29 kg/m    Physical Exam 0730: Physical examination:  Nursing notes reviewed; Vital signs and O2 SAT reviewed;  Constitutional: Well developed, Well nourished, Well hydrated, In no acute distress; Head:  Normocephalic, atraumatic; Eyes: EOMI, PERRL, No scleral icterus; ENMT: Mouth and pharynx normal, Mucous membranes moist; Neck: Supple, Full range of motion, No lymphadenopathy; Cardiovascular: Regular rate and rhythm, No gallop; Respiratory: Breath sounds clear & equal bilaterally, No wheezes.  Speaking full sentences with ease, Normal respiratory effort/excursion; Chest: Nontender, Movement normal; Abdomen: Soft, Nontender, Nondistended, Normal bowel sounds; Genitourinary: No CVA tenderness; Extremities: Peripheral pulses normal, +FROM left knee, pt is able to lift extended LLE off stretcher, and extend left lower leg against resistance.  No ligamentous laxity.  No patellar or quad tendon step-offs.  NMS intact left foot, strong pedal pp. +plantarflexion of left foot w/calf squeeze.  No palpable gap left Achilles's tendon.  No proximal fibular head tenderness.  No erythema, warmth, ecchymosis or deformity.  +mild generalized edema, and generalized TTP without specific area of point tenderness. No deformity, No calf edema or asymmetry.; Neuro: AA&Ox3, Major CN grossly intact.  Speech clear. No gross focal motor or sensory deficits in extremities.; Skin: Color normal, Warm, Dry.    ED Treatments / Results  Labs (all labs ordered are listed, but only abnormal results are displayed)   EKG None  Radiology   Procedures Procedures (including critical care time)  Medications Ordered in ED Medications  oxyCODONE-acetaminophen (PERCOCET/ROXICET) 5-325 MG per tablet 2 tablet (has no administration in time range)     Initial Impression / Assessment and Plan / ED Course  I have reviewed the triage vital signs and the nursing notes.  Pertinent labs & imaging results that were available during my care of  the patient were reviewed by me and considered in my medical decision making (see chart for details).  MDM Reviewed: previous chart, nursing note and vitals Interpretation: x-ray   Dg Knee Complete 4 Views Left Result Date: 05/20/2018 CLINICAL DATA:  Progressive pain EXAM: LEFT KNEE - COMPLETE 4+ VIEW COMPARISON:  November 07, 2014 FINDINGS: Frontal, lateral, and bilateral oblique views were obtained. There is no appreciable fracture or dislocation. No joint effusion. There is slight narrowing medially. Other joint spaces appear unremarkable. No erosive change or intra-articular calcification. IMPRESSION: Slight narrowing medially.  No fracture or joint effusion. Electronically Signed   By: Lowella Grip III M.D.   On: 05/20/2018 07:12     0735:  Known hx of gout; tx for same. Doubt septic joint at this time. [place] PMP Database accessed: pt with 2 narcotic rx written by 2 different providers in the past 2 weeks. Pt referred back to PMD for pain management. Dx and testing d/w pt.  Questions answered.  Verb understanding, agreeable to d/c home with outpt f/u.  Final Clinical Impressions(s) / ED Diagnoses   Final diagnoses:  None    ED Discharge Orders    None       Francine Graven, DO 05/24/18 4301

## 2018-05-20 NOTE — ED Triage Notes (Signed)
Pt c/o left knee pain; pt states he has a hx of gout and believes this is a gout flareup

## 2018-06-26 DIAGNOSIS — M25562 Pain in left knee: Secondary | ICD-10-CM | POA: Diagnosis not present

## 2018-06-26 DIAGNOSIS — M109 Gout, unspecified: Secondary | ICD-10-CM | POA: Diagnosis not present

## 2018-06-26 DIAGNOSIS — I10 Essential (primary) hypertension: Secondary | ICD-10-CM | POA: Diagnosis not present

## 2018-07-15 DIAGNOSIS — M545 Low back pain: Secondary | ICD-10-CM | POA: Diagnosis not present

## 2018-07-17 ENCOUNTER — Ambulatory Visit: Payer: Medicaid Other | Admitting: Gastroenterology

## 2018-08-10 DIAGNOSIS — M79672 Pain in left foot: Secondary | ICD-10-CM | POA: Diagnosis not present

## 2018-09-14 DIAGNOSIS — M1A00X0 Idiopathic chronic gout, unspecified site, without tophus (tophi): Secondary | ICD-10-CM | POA: Diagnosis not present

## 2018-09-22 DIAGNOSIS — M7582 Other shoulder lesions, left shoulder: Secondary | ICD-10-CM | POA: Diagnosis not present

## 2018-10-05 DIAGNOSIS — M7542 Impingement syndrome of left shoulder: Secondary | ICD-10-CM | POA: Diagnosis not present

## 2018-10-05 DIAGNOSIS — M25512 Pain in left shoulder: Secondary | ICD-10-CM | POA: Diagnosis not present

## 2018-10-07 DIAGNOSIS — M7522 Bicipital tendinitis, left shoulder: Secondary | ICD-10-CM | POA: Diagnosis not present

## 2018-10-07 DIAGNOSIS — I10 Essential (primary) hypertension: Secondary | ICD-10-CM | POA: Diagnosis not present

## 2018-10-07 DIAGNOSIS — M25512 Pain in left shoulder: Secondary | ICD-10-CM | POA: Diagnosis not present

## 2018-10-07 DIAGNOSIS — R7303 Prediabetes: Secondary | ICD-10-CM | POA: Diagnosis not present

## 2018-10-07 DIAGNOSIS — E782 Mixed hyperlipidemia: Secondary | ICD-10-CM | POA: Diagnosis not present

## 2018-10-07 DIAGNOSIS — M109 Gout, unspecified: Secondary | ICD-10-CM | POA: Diagnosis not present

## 2018-10-07 DIAGNOSIS — R7301 Impaired fasting glucose: Secondary | ICD-10-CM | POA: Diagnosis not present

## 2018-10-20 ENCOUNTER — Encounter: Payer: Self-pay | Admitting: Gastroenterology

## 2018-10-20 ENCOUNTER — Ambulatory Visit (INDEPENDENT_AMBULATORY_CARE_PROVIDER_SITE_OTHER): Payer: Medicaid Other | Admitting: Gastroenterology

## 2018-10-20 ENCOUNTER — Other Ambulatory Visit: Payer: Self-pay

## 2018-10-20 DIAGNOSIS — Z79899 Other long term (current) drug therapy: Secondary | ICD-10-CM | POA: Diagnosis not present

## 2018-10-20 DIAGNOSIS — Z1211 Encounter for screening for malignant neoplasm of colon: Secondary | ICD-10-CM | POA: Insufficient documentation

## 2018-10-20 HISTORY — DX: Encounter for screening for malignant neoplasm of colon: Z12.11

## 2018-10-20 NOTE — Assessment & Plan Note (Signed)
Very pleasant 53 y/o male presents for first ever screening colonoscopy. Due to intermittent narcotic use, prior h/o remote etoh abuse would recommend deep sedation. Plan for colonoscopy with propofol in near future.  I have discussed the risks, alternatives, benefits with regards to but not limited to the risk of reaction to medication, bleeding, infection, perforation and the patient is agreeable to proceed. Written consent to be obtained.

## 2018-10-20 NOTE — Progress Notes (Signed)
Primary Care Physician:  Celene Squibb, MD  Primary Gastroenterologist:  Barney Drain, MD  Chief Complaint  Patient presents with  . Consult    TCS.    HPI:  Xavier Daniels is a 53 y.o. male here to schedule first ever colonoscopy. He denies constipation, diarrhea, melena, brbpr, abd pain, GERD, weight loss. No FH of CRC. Patient has remote h/o etoh abuse. He has required opioids for pain management off/on over the past one year.   Current Outpatient Medications  Medication Sig Dispense Refill  . atenolol (TENORMIN) 100 MG tablet TAKE ONE (1) TABLET BY MOUTH EVERY DAY 30 tablet 3  . colchicine 0.6 MG tablet Take 0.6 mg by mouth as needed.    . febuxostat (ULORIC) 40 MG tablet Take 40 mg by mouth daily.    Marland Kitchen HYDROcodone-acetaminophen (NORCO) 7.5-325 MG tablet Take 1 tablet by mouth every 6 (six) hours as needed for moderate pain.    Marland Kitchen ibuprofen (ADVIL) 800 MG tablet Take 800 mg by mouth every 8 (eight) hours as needed.    . indomethacin (INDOCIN) 25 MG capsule 50mg  PO TID x2 days, then 25mg  PO TID x3 days (Patient taking differently: Take 50 mg by mouth as needed. ) 21 capsule 0  . losartan (COZAAR) 50 MG tablet Take 50 mg by mouth daily.    Marland Kitchen tiZANidine (ZANAFLEX) 4 MG tablet Take 4 mg by mouth every 6 (six) hours as needed for muscle spasms.     No current facility-administered medications for this visit.     Allergies as of 10/20/2018  . (No Known Allergies)    Past Medical History:  Diagnosis Date  . Arthritis   . Gout   . Gout   . Hypertension   . Substance abuse ([place])    ETOH, 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, CLEAN FOR 13 YEARS    Past Surgical History:  Procedure Laterality Date  . TOTAL HIP ARTHROPLASTY  2007   due to injury for avascular necrosis.    Family History  Problem Relation Age of Onset  . Diabetes Mother   . Diabetes Brother     Social History   Socioeconomic History  . Marital status: Married    Spouse name: Not on file  . Number of children: Not on file   . Years of education: Not on file  . Highest education level: Not on file  Occupational History  . Not on file  Social Needs  . Financial resource strain: Not on file  . Food insecurity    Worry: Not on file    Inability: Not on file  . Transportation needs    Medical: Not on file    Non-medical: Not on file  Tobacco Use  . Smoking status: Never Smoker  . Smokeless tobacco: Never Used  Substance and Sexual Activity  . Alcohol use: No    Comment: previous heavy use, quit 2004  . Drug use: No    Comment: previous marijuana use  . Sexual activity: Yes    Birth control/protection: None  Lifestyle  . Physical activity    Days per week: Not on file    Minutes per session: Not on file  . Stress: Not on file  Relationships  . Social Herbalist on phone: Not on file    Gets together: Not on file    Attends religious service: Not on file    Active member of club or organization: Not on file    Attends meetings  of clubs or organizations: Not on file    Relationship status: Not on file  . Intimate partner violence    Fear of current or ex partner: Not on file    Emotionally abused: Not on file    Physically abused: Not on file    Forced sexual activity: Not on file  Other Topics Concern  . Not on file  Social History Narrative  . Not on file      ROS:  General: Negative for anorexia, weight loss, fever, chills, fatigue, weakness. Eyes: Negative for vision changes.  ENT: Negative for hoarseness, difficulty swallowing , nasal congestion. CV: Negative for chest pain, angina, palpitations, dyspnea on exertion, peripheral edema.  Respiratory: Negative for dyspnea at rest, dyspnea on exertion, cough, sputum, wheezing.  GI: See history of present illness. GU:  Negative for dysuria, hematuria, urinary incontinence, urinary frequency, nocturnal urination.  MS: Negative for joint pain, low back pain.  Derm: Negative for rash or itching.  Neuro: Negative for weakness,  abnormal sensation, seizure, frequent headaches, memory loss, confusion.  Psych: Negative for anxiety, depression, suicidal ideation, hallucinations.  Endo: Negative for unusual weight change.  Heme: Negative for bruising or bleeding. Allergy: Negative for rash or hives.    Physical Examination:  BP 134/83   Pulse 76   Temp (!) 97.2 F (36.2 C) (Oral)   Ht 6' (1.829 m)   Wt 238 lb 3.2 oz (108 kg)   BMI 32.31 kg/m    General: Well-nourished, well-developed in no acute distress.  Head: Normocephalic, atraumatic.   Eyes: Conjunctiva pink, no icterus. Mouth: Oropharyngeal mucosa moist and pink , no lesions erythema or exudate. Neck: Supple without thyromegaly, masses, or lymphadenopathy.  Lungs: Clear to auscultation bilaterally.  Heart: Regular rate and rhythm, no murmurs rubs or gallops.  Abdomen: Bowel sounds are normal, nontender, nondistended, no hepatosplenomegaly or masses, no abdominal bruits or    hernia , no rebound or guarding.   Rectal: not performed Extremities: No lower extremity edema. No clubbing or deformities.  Neuro: Alert and oriented x 4 , grossly normal neurologically.  Skin: Warm and dry, no rash or jaundice.   Psych: Alert and cooperative, normal mood and affect.  Labs: Lab Results  Component Value Date   CREATININE 1.14 04/28/2017   BUN 14 04/28/2017   NA 138 04/28/2017   K 3.4 (L) 04/28/2017   CL 101 04/28/2017   CO2 22 04/28/2017   Lab Results  Component Value Date   ALT 17 04/28/2017   AST 30 04/28/2017   ALKPHOS 66 04/28/2017   BILITOT 0.6 04/28/2017   Lab Results  Component Value Date   WBC 9.4 04/28/2017   HGB 14.5 04/28/2017   HCT 43.1 04/28/2017   MCV 88.1 04/28/2017   PLT 190 04/28/2017     Imaging Studies: No results found.

## 2018-10-20 NOTE — Patient Instructions (Signed)
Colonoscopy as scheduled. See separate instructions.  

## 2018-10-21 NOTE — Progress Notes (Signed)
cc'ed to pcp °

## 2018-11-03 ENCOUNTER — Telehealth: Payer: Self-pay | Admitting: *Deleted

## 2018-11-03 ENCOUNTER — Encounter (HOSPITAL_COMMUNITY): Payer: Self-pay

## 2018-11-03 ENCOUNTER — Other Ambulatory Visit: Payer: Self-pay | Admitting: *Deleted

## 2018-11-03 ENCOUNTER — Other Ambulatory Visit: Payer: Self-pay

## 2018-11-03 ENCOUNTER — Emergency Department (HOSPITAL_COMMUNITY)
Admission: EM | Admit: 2018-11-03 | Discharge: 2018-11-03 | Disposition: A | Discharge status: Home / Self Care | Payer: Medicaid Other | Attending: Emergency Medicine | Admitting: Emergency Medicine

## 2018-11-03 DIAGNOSIS — X58XXXA Exposure to other specified factors, initial encounter: Secondary | ICD-10-CM | POA: Diagnosis not present

## 2018-11-03 DIAGNOSIS — Y9389 Activity, other specified: Secondary | ICD-10-CM | POA: Insufficient documentation

## 2018-11-03 DIAGNOSIS — Z79899 Other long term (current) drug therapy: Secondary | ICD-10-CM | POA: Insufficient documentation

## 2018-11-03 DIAGNOSIS — S161XXA Strain of muscle, fascia and tendon at neck level, initial encounter: Secondary | ICD-10-CM | POA: Diagnosis not present

## 2018-11-03 DIAGNOSIS — T148XXA Other injury of unspecified body region, initial encounter: Secondary | ICD-10-CM

## 2018-11-03 DIAGNOSIS — S46912A Strain of unspecified muscle, fascia and tendon at shoulder and upper arm level, left arm, initial encounter: Secondary | ICD-10-CM | POA: Diagnosis not present

## 2018-11-03 DIAGNOSIS — I10 Essential (primary) hypertension: Secondary | ICD-10-CM | POA: Diagnosis not present

## 2018-11-03 DIAGNOSIS — Z1211 Encounter for screening for malignant neoplasm of colon: Secondary | ICD-10-CM

## 2018-11-03 DIAGNOSIS — Y999 Unspecified external cause status: Secondary | ICD-10-CM | POA: Diagnosis not present

## 2018-11-03 DIAGNOSIS — Y9289 Other specified places as the place of occurrence of the external cause: Secondary | ICD-10-CM | POA: Insufficient documentation

## 2018-11-03 DIAGNOSIS — S4992XA Unspecified injury of left shoulder and upper arm, initial encounter: Secondary | ICD-10-CM | POA: Diagnosis present

## 2018-11-03 MED ORDER — PEG 3350-KCL-NA BICARB-NACL 420 G PO SOLR: 4000.0000 mL | Freq: Once | ORAL | 0 refills | Status: AC

## 2018-11-03 MED ORDER — PREDNISONE 20 MG PO TABS: 40.0000 mg | ORAL_TABLET | Freq: Every day | ORAL | 0 refills | Status: DC

## 2018-11-03 NOTE — ED Triage Notes (Addendum)
Pt repots what feels like a pulled muscle in neck, shoulder and chest . . 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 Pt reports he was seen by Dr hall and given a muscle relaxant

## 2018-11-03 NOTE — Telephone Encounter (Signed)
Called patient. He is scheduled for TCS with propofol with [facility] on 10/6/ at 9:30am. Patient aware will need pre-op appt at short stay. I will mail his prep instructions with his pre-op appt to him (confirmed address). Rx sent to [facility]. Orders entered.

## 2018-11-03 NOTE — ED Provider Notes (Signed)
[facility] EMERGENCY DEPARTMENT Provider Note   CSN: [id number] Arrival date & time: 11/03/18  7353    History   Chief Complaint Chief Complaint  Patient presents with  . Shoulder Pain    HPI Xavier Daniels is a 53 y.o. male.     HPI Patient presents with left shoulder neck and chest pain.  Has been dealing with shoulder pain for a while now.  Has seen orthopedic surgeon and had an injection.  That was a few weeks ago.  States that now the pain is more up in his neck/trapezius area.  States that the shoulder part is feeling somewhat better.  States however now he is also having pain from his left axilla coming across to his anterior chest.  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  Had been seen by Dr. Nevada Crane, his primary care doctor for the shoulder pain and given a muscle relaxant.  States it helps some but does not have a whole lot to help. Past Medical History:  Diagnosis Date  . Arthritis   . Gout   . Gout   . Hypertension   . Substance abuse ([place])    ETOH, 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, CLEAN FOR 13 YEARS    Patient Active Problem List   Diagnosis Date Noted  . Encounter for screening colonoscopy 10/20/2018  . Polypharmacy 10/20/2018  . Gout attack 06/20/2014  . Essential hypertension 06/06/2014  . Hyperlipidemia 06/06/2014  . Gout 06/06/2014  . Obesity 06/06/2014    Past Surgical History:  Procedure Laterality Date  . TOTAL HIP ARTHROPLASTY  2007   due to injury for avascular necrosis.        Home Medications    Prior to Admission medications   Medication Sig Start Date End Date Taking? Authorizing Provider  atenolol (TENORMIN) 100 MG tablet TAKE ONE (1) TABLET BY MOUTH EVERY DAY 10/08/16   Alycia Rossetti, MD  colchicine 0.6 MG tablet Take 0.6 mg by mouth as needed.    [provider]  febuxostat (ULORIC) 40 MG tablet Take 40 mg by mouth daily.    [provider]  HYDROcodone-acetaminophen (NORCO)  7.5-325 MG tablet Take 1 tablet by mouth every 6 (six) hours as needed for moderate pain.    [provider]  ibuprofen (ADVIL) 800 MG tablet Take 800 mg by mouth every 8 (eight) hours as needed.    [provider]  indomethacin (INDOCIN) 25 MG capsule 50mg  PO TID x2 days, then 25mg  PO TID x3 days Patient taking differently: Take 50 mg by mouth as needed.  05/20/18   Francine Graven, DO  losartan (COZAAR) 50 MG tablet Take 50 mg by mouth daily.    [provider]  polyethylene glycol-electrolytes (NULYTELY/GOLYTELY) 420 g solution Take 4,000 mLs by mouth once for 1 dose. 11/03/18 11/03/18  Fields, Marga Melnick, MD  predniSONE (DELTASONE) 20 MG tablet Take 2 tablets (40 mg total) by mouth daily. 11/03/18   Davonna Belling, MD  tiZANidine (ZANAFLEX) 4 MG tablet Take 4 mg by mouth every 6 (six) hours as needed for muscle spasms.    [provider]    Family History Family History  Problem Relation Age of Onset  . Diabetes Mother   . Diabetes Brother     Social History Social History   Tobacco Use  . Smoking status: Never Smoker  . Smokeless tobacco: Never Used  Substance Use Topics  . Alcohol use: No  Comment: previous heavy use, quit 2004  . Drug use: No    Comment: previous marijuana use     Allergies   Patient has no known allergies.   Review of Systems Review of Systems  Constitutional: Negative for appetite change.  HENT: Negative for congestion.   Respiratory: Negative for shortness of breath.   Cardiovascular: Positive for chest pain.  Gastrointestinal: Negative for abdominal pain.  Genitourinary: Negative for flank pain.  Musculoskeletal: Positive for back pain and neck pain.  Skin: Negative for rash.  Neurological: Negative for weakness.     Physical Exam Updated Vital Signs BP (!) 135/92   Pulse 63   Temp 97.9 F (36.6 C) (Oral)   Resp (!) 23   Ht 6' (1.829 m)   Wt 108 kg   SpO2 100%   BMI 32.29 kg/m   Physical Exam  Vitals signs and nursing note reviewed.  HENT:     Head: Normocephalic and atraumatic.     Mouth/Throat:     Mouth: Mucous membranes are moist.  Cardiovascular:     Rate and Rhythm: Regular rhythm.  Chest:     Chest wall: No tenderness.  Abdominal:     Tenderness: There is no abdominal tenderness.  Musculoskeletal:        General: Tenderness present.     Comments: Tenderness over left superior trapezius.  Mild pain with movement of left shoulder.  Left shoulder not irritable.  Neurovascular intact in left hand.  Skin:    Capillary Refill: Capillary refill takes less than 2 seconds.  Neurological:     Mental Status: He is alert. Mental status is at baseline.      ED Treatments / Results  Labs (all labs ordered are listed, but only abnormal results are displayed) Labs Reviewed - No data to display  EKG EKG Interpretation  Date/Time:  Tuesday November 03 2018 09:03:46 EDT Ventricular Rate:  65 PR Interval:    QRS Duration: 112 QT Interval:  394 QTC Calculation: 410 R Axis:   7 Text Interpretation:  Sinus rhythm Borderline intraventricular conduction delay Low voltage, precordial leads RSR' in V1 or V2, probably normal variant Confirmed by Davonna Belling [phone]) on 11/03/2018 2:54:09 PM   Radiology No results found.  Procedures Procedures (including critical care time)  Medications Ordered in ED Medications - No data to display   Initial Impression / Assessment and Plan / ED Course  I have reviewed the triage vital signs and the nursing notes.  Pertinent labs & imaging results that were available during my care of the patient were reviewed by me and considered in my medical decision making (see chart for details).        Patient with back pain.  Also goes around to the anterior chest wall.  EKG reassuring.  Doubt cardiac cause.  Has likely muscular pain in the shoulder.  Will give oral steroids few days to help.  Discharge home with outpatient follow-up.  Final  Clinical Impressions(s) / ED Diagnoses   Final diagnoses:  Muscle strain    ED Discharge Orders         Ordered    predniSONE (DELTASONE) 20 MG tablet  Daily     11/03/18 0920           Davonna Belling, MD 11/03/18 1456

## 2018-11-11 DIAGNOSIS — I10 Essential (primary) hypertension: Secondary | ICD-10-CM | POA: Diagnosis not present

## 2018-11-26 DIAGNOSIS — M25512 Pain in left shoulder: Secondary | ICD-10-CM | POA: Diagnosis not present

## 2018-12-30 ENCOUNTER — Encounter (HOSPITAL_COMMUNITY): Payer: Self-pay | Admitting: Emergency Medicine

## 2018-12-30 ENCOUNTER — Emergency Department (HOSPITAL_COMMUNITY)
Admission: EM | Admit: 2018-12-30 | Discharge: 2018-12-30 | Disposition: A | Discharge status: Home / Self Care | Payer: Medicaid Other | Attending: Emergency Medicine | Admitting: Emergency Medicine

## 2018-12-30 ENCOUNTER — Other Ambulatory Visit: Payer: Self-pay

## 2018-12-30 ENCOUNTER — Emergency Department (HOSPITAL_COMMUNITY): Payer: Medicaid Other

## 2018-12-30 DIAGNOSIS — I10 Essential (primary) hypertension: Secondary | ICD-10-CM | POA: Diagnosis not present

## 2018-12-30 DIAGNOSIS — Z79899 Other long term (current) drug therapy: Secondary | ICD-10-CM | POA: Diagnosis not present

## 2018-12-30 DIAGNOSIS — R079 Chest pain, unspecified: Secondary | ICD-10-CM | POA: Diagnosis not present

## 2018-12-30 DIAGNOSIS — R0789 Other chest pain: Secondary | ICD-10-CM

## 2018-12-30 LAB — CBC WITH DIFFERENTIAL/PLATELET
Abs Immature Granulocytes: 0.02 10*3/uL (ref 0.00–0.07)
Basophils Absolute: 0 10*3/uL (ref 0.0–0.1)
Basophils Relative: 1 %
Eosinophils Absolute: 0.1 10*3/uL (ref 0.0–0.5)
Eosinophils Relative: 3 %
HCT: 41.3 % (ref 39.0–52.0)
Hemoglobin: 13.3 g/dL (ref 13.0–17.0)
Immature Granulocytes: 1 %
Lymphocytes Relative: 47 %
Lymphs Abs: 2 10*3/uL (ref 0.7–4.0)
MCH: 28.8 pg (ref 26.0–34.0)
MCHC: 32.2 g/dL (ref 30.0–36.0)
MCV: 89.4 fL (ref 80.0–100.0)
Monocytes Absolute: 0.3 10*3/uL (ref 0.1–1.0)
Monocytes Relative: 8 %
Neutro Abs: 1.7 10*3/uL (ref 1.7–7.7)
Neutrophils Relative %: 40 %
Platelets: 222 10*3/uL (ref 150–400)
RBC: 4.62 MIL/uL (ref 4.22–5.81)
RDW: 13.3 % (ref 11.5–15.5)
WBC: 4.1 10*3/uL (ref 4.0–10.5)
nRBC: 0 % (ref 0.0–0.2)

## 2018-12-30 LAB — BASIC METABOLIC PANEL
Anion gap: 9 (ref 5–15)
BUN: 11 mg/dL (ref 6–20)
CO2: 22 mmol/L (ref 22–32)
Calcium: 9.4 mg/dL (ref 8.9–10.3)
Chloride: 107 mmol/L (ref 98–111)
Creatinine, Ser: 1.04 mg/dL (ref 0.61–1.24)
GFR calc Af Amer: >60 mL/min (ref >60)
GFR calc non Af Amer: >60 mL/min (ref >60)
Glucose, Bld: 111 mg/dL — ABNORMAL HIGH (ref 70–99)
Potassium: 3.3 mmol/L — ABNORMAL LOW (ref 3.5–5.1)
Sodium: 138 mmol/L (ref 135–145)

## 2018-12-30 LAB — TROPONIN I (HIGH SENSITIVITY)
Troponin I (High Sensitivity): 5 ng/L (ref <18)
Troponin I (High Sensitivity): 4 ng/L (ref <18)

## 2018-12-30 MED ORDER — DICLOFENAC SODIUM 75 MG PO TBEC: 75.0000 mg | DELAYED_RELEASE_TABLET | Freq: Two times a day (BID) | ORAL | 0 refills | Status: DC

## 2018-12-30 MED ORDER — METHOCARBAMOL 500 MG PO TABS: 500.0000 mg | ORAL_TABLET | Freq: Two times a day (BID) | ORAL | 0 refills | Status: DC

## 2018-12-30 NOTE — ED Provider Notes (Signed)
[facility] EMERGENCY DEPARTMENT Provider Note   CSN: [id number] Arrival date & time: 12/30/18  1205     History   Chief Complaint Chief Complaint  Patient presents with  . Chest Pain  . Shoulder Pain    HPI Xavier Daniels is a 53 y.o. male.     Patient complains of soreness in the left side of his chest  The history is provided by the patient. No language interpreter was used.  Shoulder Pain .  Patient reports he has pain with moving his left shoulder.  He has a history of having shoulder problems in the past.  Patient reports he was lifting boxes of pork yesterday when the pain started.  Patient reports he has continued to have pain today.  Patient complains of pain on the left side of his chest.  Pain is from his chest to his left shoulder.  The pain is worse with movement.  Patient has a history of hypertension he has been told that his cholesterol is borderline he does not smoke  Past Medical History:  Diagnosis Date  . Arthritis   . Gout   . Gout   . Hypertension   . Substance abuse ([place])    ETOH, 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, CLEAN FOR 13 YEARS    Patient Active Problem List   Diagnosis Date Noted  . Encounter for screening colonoscopy 10/20/2018  . Polypharmacy 10/20/2018  . Gout attack 06/20/2014  . Essential hypertension 06/06/2014  . Hyperlipidemia 06/06/2014  . Gout 06/06/2014  . Obesity 06/06/2014    Past Surgical History:  Procedure Laterality Date  . TOTAL HIP ARTHROPLASTY  2007   due to injury for avascular necrosis.        Home Medications    Prior to Admission medications   Medication Sig Start Date End Date Taking? Authorizing Provider  atenolol (TENORMIN) 100 MG tablet TAKE ONE (1) TABLET BY MOUTH EVERY DAY 10/08/16  Yes Hitterdal, Modena Nunnery, MD  colchicine 0.6 MG tablet Take 0.6 mg by mouth as needed.   Yes [provider]  febuxostat (ULORIC) 40 MG tablet Take 40 mg by mouth daily.   Yes [provider]   HYDROcodone-acetaminophen (NORCO) 7.5-325 MG tablet Take 1 tablet by mouth every 6 (six) hours as needed for moderate pain.   Yes [provider]  ibuprofen (ADVIL) 800 MG tablet Take 800 mg by mouth every 8 (eight) hours as needed.   Yes [provider]  indomethacin (INDOCIN) 25 MG capsule 50mg  PO TID x2 days, then 25mg  PO TID x3 days Patient taking differently: Take 50 mg by mouth as needed.  05/20/18  Yes Francine Graven, DO  losartan (COZAAR) 50 MG tablet Take 50 mg by mouth daily.   Yes [provider]  tiZANidine (ZANAFLEX) 4 MG tablet Take 4 mg by mouth every 6 (six) hours as needed for muscle spasms.   Yes [provider]  diclofenac (VOLTAREN) 75 MG EC tablet Take 1 tablet (75 mg total) by mouth 2 (two) times daily. 12/30/18   Fransico Meadow, PA-C  methocarbamol (ROBAXIN) 500 MG tablet Take 1 tablet (500 mg total) by mouth 2 (two) times daily. 12/30/18   Fransico Meadow, PA-C  predniSONE (DELTASONE) 20 MG tablet Take 2 tablets (40 mg total) by mouth daily. Patient not taking: Reported on 12/30/2018 11/03/18   Davonna Belling, MD    Family History Family History  Problem Relation Age of Onset  . Diabetes Mother   . Diabetes  Brother     Social History Social History   Tobacco Use  . Smoking status: Never Smoker  . Smokeless tobacco: Never Used  Substance Use Topics  . Alcohol use: No    Comment: previous heavy use, quit 2004  . Drug use: No    Comment: previous marijuana use     Allergies   Patient has no known allergies.   Review of Systems Review of Systems  Cardiovascular: Positive for chest pain.  All other systems reviewed and are negative.    Physical Exam Updated Vital Signs BP (!) 155/96   Pulse 61   Temp 98.2 F (36.8 C) (Oral)   Resp 18   Ht 6' (1.829 m)   Wt 108.9 kg   SpO2 98%   BMI 32.55 kg/m   Physical Exam Vitals signs reviewed.  Constitutional:      Appearance: He is well-developed.  HENT:      Head: Normocephalic.  Eyes:     Pupils: Pupils are equal, round, and reactive to light.  Neck:     Musculoskeletal: Normal range of motion.  Cardiovascular:     Rate and Rhythm: Normal rate.     Heart sounds: Normal heart sounds.  Pulmonary:     Effort: Pulmonary effort is normal.     Breath sounds: Normal breath sounds.  Abdominal:     Palpations: Abdomen is soft.  Musculoskeletal: Normal range of motion.     Comments: Patient has pain with abduction of the left shoulder he is tender on the left side of his chest  Skin:    General: Skin is warm.  Neurological:     General: No focal deficit present.     Mental Status: He is alert.  Psychiatric:        Mood and Affect: Mood normal.      ED Treatments / Results  Labs (all labs ordered are listed, but only abnormal results are displayed) Labs Reviewed  BASIC METABOLIC PANEL - Abnormal; Notable for the following components:      Result Value   Potassium 3.3 (*)    Glucose, Bld 111 (*)    All other components within normal limits  CBC WITH DIFFERENTIAL/PLATELET  TROPONIN I (HIGH SENSITIVITY)  TROPONIN I (HIGH SENSITIVITY)    EKG EKG Interpretation  Date/Time:  Wednesday December 30 2018 12:16:11 EDT Ventricular Rate:  77 PR Interval:    QRS Duration: 108 QT Interval:  388 QTC Calculation: 440 R Axis:   18 Text Interpretation:  Sinus rhythm RSR' in V1 or V2, probably normal variant No STEMI  Confirmed by Nanda Quinton [phone]) on 12/30/2018 12:36:44 PM   Radiology Dg Chest Port 1 View  Result Date: 12/30/2018 CLINICAL DATA:  Chest pain EXAM: PORTABLE CHEST 1 VIEW COMPARISON:  May 20, 2014 chest radiograph and chest CT July 03, 2015 FINDINGS: No edema or consolidation. Heart size and pulmonary vascularity are normal. There remains slight right hilar fullness. No other areas suggesting potential adenopathy. No pneumothorax. No bone lesions. IMPRESSION: Persistent mild fullness in the right hilar region with  potential for a degree of adenopathy in this area, grossly stable. No new foci of potential adenopathy by radiography. No edema or consolidation. Stable cardiac silhouette. Electronically Signed   By: Lowella Grip III M.D.   On: 12/30/2018 13:26    Procedures Procedures (including critical care time)  Medications Ordered in ED Medications - No data to display   Initial Impression / Assessment and Plan / ED Course  I have reviewed the triage vital signs and the nursing notes.  Pertinent labs & imaging results that were available during my care of the patient were reviewed by me and considered in my medical decision making (see chart for details).        MDM patient symptoms seem musculoskeletal I suspect they are related to his lifting from last night.  Patient does have some cardiac risk factors including his sex, age. Hypertension and possibly elevated cholesterol.  His EKG is nonacute his chest x-ray shows adenopathy which he has had all the way back to 2016 and is unchanged.  Patient's troponins are negative x2.  Patient counseled I will start him on Voltaren and Robaxin I think it is unlikely that he has cardiac chest pain.  He is advised to follow-up with his primary care physician for recheck.  Final Clinical Impressions(s) / ED Diagnoses   Final diagnoses:  Chest wall pain    ED Discharge Orders         Ordered    methocarbamol (ROBAXIN) 500 MG tablet  2 times daily     12/30/18 1601    diclofenac (VOLTAREN) 75 MG EC tablet  2 times daily     12/30/18 1601        An After Visit Summary was printed and given to the patient.    Fransico Meadow, [place] 12/30/18 1626    Margette Fast, MD 12/30/18 [phone]

## 2018-12-30 NOTE — Discharge Instructions (Signed)
Return if any problems.

## 2018-12-30 NOTE — ED Triage Notes (Signed)
Pt states that he has been having left sided chest pains and left shoulder pain since last night he was left pork shoulders yesterday and he does not know if he hurt himself doing that.

## 2019-01-13 DIAGNOSIS — Z23 Encounter for immunization: Secondary | ICD-10-CM | POA: Diagnosis not present

## 2019-01-15 ENCOUNTER — Encounter (HOSPITAL_COMMUNITY)
Admission: RE | Admit: 2019-01-15 | Discharge: 2019-01-15 | Disposition: A | Discharge status: Home / Self Care | Payer: Medicaid Other | Source: Ambulatory Visit | Attending: Gastroenterology | Admitting: Gastroenterology

## 2019-01-15 ENCOUNTER — Other Ambulatory Visit: Payer: Self-pay

## 2019-01-15 ENCOUNTER — Other Ambulatory Visit (HOSPITAL_COMMUNITY)
Admission: RE | Admit: 2019-01-15 | Discharge: 2019-01-15 | Disposition: A | Discharge status: Home / Self Care | Payer: Medicaid Other | Source: Ambulatory Visit | Attending: Gastroenterology | Admitting: Gastroenterology

## 2019-01-15 DIAGNOSIS — Z20828 Contact with and (suspected) exposure to other viral communicable diseases: Secondary | ICD-10-CM | POA: Diagnosis not present

## 2019-01-15 DIAGNOSIS — Z01812 Encounter for preprocedural laboratory examination: Secondary | ICD-10-CM | POA: Diagnosis not present

## 2019-01-16 LAB — SARS CORONAVIRUS 2 (TAT 6-24 HRS): SARS Coronavirus 2: POSITIVE — AB

## 2019-01-16 NOTE — Progress Notes (Signed)
Covid 19 test results positive, messaged Dr. Oneida Alar.

## 2019-01-18 ENCOUNTER — Telehealth: Payer: Self-pay

## 2019-01-18 NOTE — Telephone Encounter (Signed)
I called pt to let him know he tested positive for Covid and his procedure has been cancelled and will be rescheduled at a later date.  He said he is not sick at all, has no symptoms and has not been around anyone that he is aware of with Covid.  I told him he would need to let PCP know and quarantine for 14 days and that he would be called by [facility].  He expressed understanding of what he is to do.   Forwarding FYI to Neil Crouch, PA who saw the patient in the office in July 2020.

## 2019-01-18 NOTE — Telephone Encounter (Signed)
Melanie at [facility] called office, pt's COVID test was positive. TCS w/[facility] for tomorrow was cancelled.  Routing to DS.

## 2019-01-19 NOTE — Telephone Encounter (Signed)
Noted. Can we reschedule him per protocol (ie after waiting period)?

## 2019-01-19 NOTE — Telephone Encounter (Signed)
Called pt. He has been r/s'd to 1/5 at 8:30am. Patient aware will mail pre-op/covid-19 testing/prep instructions to him. Called endo and aware of appt time/date.

## 2019-01-20 ENCOUNTER — Encounter: Payer: Self-pay | Admitting: *Deleted

## 2019-03-01 DIAGNOSIS — I10 Essential (primary) hypertension: Secondary | ICD-10-CM | POA: Diagnosis not present

## 2019-03-01 DIAGNOSIS — R7301 Impaired fasting glucose: Secondary | ICD-10-CM | POA: Diagnosis not present

## 2019-03-01 DIAGNOSIS — M109 Gout, unspecified: Secondary | ICD-10-CM | POA: Diagnosis not present

## 2019-03-01 DIAGNOSIS — R7303 Prediabetes: Secondary | ICD-10-CM | POA: Diagnosis not present

## 2019-03-01 DIAGNOSIS — E782 Mixed hyperlipidemia: Secondary | ICD-10-CM | POA: Diagnosis not present

## 2019-03-17 DIAGNOSIS — E669 Obesity, unspecified: Secondary | ICD-10-CM | POA: Diagnosis not present

## 2019-03-17 DIAGNOSIS — M25512 Pain in left shoulder: Secondary | ICD-10-CM | POA: Diagnosis not present

## 2019-03-17 DIAGNOSIS — R7301 Impaired fasting glucose: Secondary | ICD-10-CM | POA: Diagnosis not present

## 2019-03-17 DIAGNOSIS — M109 Gout, unspecified: Secondary | ICD-10-CM | POA: Diagnosis not present

## 2019-03-17 DIAGNOSIS — M79671 Pain in right foot: Secondary | ICD-10-CM | POA: Diagnosis not present

## 2019-03-17 DIAGNOSIS — E782 Mixed hyperlipidemia: Secondary | ICD-10-CM | POA: Diagnosis not present

## 2019-03-17 DIAGNOSIS — R7303 Prediabetes: Secondary | ICD-10-CM | POA: Diagnosis not present

## 2019-03-17 DIAGNOSIS — M7522 Bicipital tendinitis, left shoulder: Secondary | ICD-10-CM | POA: Diagnosis not present

## 2019-03-17 DIAGNOSIS — I10 Essential (primary) hypertension: Secondary | ICD-10-CM | POA: Diagnosis not present

## 2019-03-17 DIAGNOSIS — Z0001 Encounter for general adult medical examination with abnormal findings: Secondary | ICD-10-CM | POA: Diagnosis not present

## 2019-04-14 ENCOUNTER — Encounter (HOSPITAL_COMMUNITY): Payer: Self-pay

## 2019-04-15 ENCOUNTER — Other Ambulatory Visit: Payer: Self-pay

## 2019-04-15 ENCOUNTER — Telehealth: Payer: Self-pay | Admitting: Gastroenterology

## 2019-04-15 ENCOUNTER — Encounter (HOSPITAL_COMMUNITY)
Admission: RE | Admit: 2019-04-15 | Discharge: 2019-04-15 | Disposition: A | Discharge status: Home / Self Care | Payer: Medicaid Other | Source: Ambulatory Visit | Attending: Gastroenterology | Admitting: Gastroenterology

## 2019-04-15 MED ORDER — NA SULFATE-K SULFATE-MG SULF 17.5-3.13-1.6 GM/177ML PO SOLN: ORAL | 0 refills | Status: DC

## 2019-04-15 NOTE — Telephone Encounter (Signed)
TRILYTE PREP NOT AT [place]. SENT RX TO [place]. PT WILL CALL IN AM IF IT IS UNAVAILABLE. MAY NEED MIRALAX PREP.

## 2019-04-19 ENCOUNTER — Telehealth: Payer: Self-pay | Admitting: Gastroenterology

## 2019-04-19 ENCOUNTER — Other Ambulatory Visit: Payer: Self-pay

## 2019-04-19 ENCOUNTER — Other Ambulatory Visit (HOSPITAL_COMMUNITY)
Admission: RE | Admit: 2019-04-19 | Discharge: 2019-04-19 | Disposition: A | Discharge status: Home / Self Care | Payer: Medicaid Other | Source: Ambulatory Visit | Attending: Gastroenterology | Admitting: Gastroenterology

## 2019-04-19 DIAGNOSIS — Z01812 Encounter for preprocedural laboratory examination: Secondary | ICD-10-CM | POA: Diagnosis present

## 2019-04-19 DIAGNOSIS — Z20822 Contact with and (suspected) exposure to covid-19: Secondary | ICD-10-CM | POA: Insufficient documentation

## 2019-04-19 LAB — SARS CORONAVIRUS 2 (TAT 6-24 HRS): SARS Coronavirus 2: NEGATIVE

## 2019-04-19 NOTE — Telephone Encounter (Signed)
Spoke to [place] at endo. COVID results are pending. Results usually take 6-12 hours. He will receive call if test is positive.   Called and informed pt.

## 2019-04-19 NOTE — Telephone Encounter (Signed)
Pt said he had his covid test done today and wanted to know when he should start his prep. He was asking how long it would take before the results were back because he didn't want to start drinking his prep if it was positive. Please advise. [phone]

## 2019-04-20 ENCOUNTER — Encounter (HOSPITAL_COMMUNITY)
Admission: RE | Disposition: A | Discharge status: Home / Self Care | Payer: Self-pay | Source: Home / Self Care | Attending: Gastroenterology

## 2019-04-20 ENCOUNTER — Ambulatory Visit (HOSPITAL_COMMUNITY)
Admission: RE | Admit: 2019-04-20 | Discharge: 2019-04-20 | Disposition: A | Discharge status: Home / Self Care | Payer: Medicaid Other | Attending: Gastroenterology | Admitting: Gastroenterology

## 2019-04-20 ENCOUNTER — Ambulatory Visit (HOSPITAL_COMMUNITY): Payer: Medicaid Other | Admitting: Anesthesiology

## 2019-04-20 DIAGNOSIS — K648 Other hemorrhoids: Secondary | ICD-10-CM | POA: Diagnosis not present

## 2019-04-20 DIAGNOSIS — M109 Gout, unspecified: Secondary | ICD-10-CM | POA: Diagnosis not present

## 2019-04-20 DIAGNOSIS — Z79899 Other long term (current) drug therapy: Secondary | ICD-10-CM | POA: Insufficient documentation

## 2019-04-20 DIAGNOSIS — Z791 Long term (current) use of non-steroidal anti-inflammatories (NSAID): Secondary | ICD-10-CM | POA: Diagnosis not present

## 2019-04-20 DIAGNOSIS — K621 Rectal polyp: Secondary | ICD-10-CM | POA: Insufficient documentation

## 2019-04-20 DIAGNOSIS — Q438 Other specified congenital malformations of intestine: Secondary | ICD-10-CM | POA: Diagnosis not present

## 2019-04-20 DIAGNOSIS — D123 Benign neoplasm of transverse colon: Secondary | ICD-10-CM | POA: Insufficient documentation

## 2019-04-20 DIAGNOSIS — M1990 Unspecified osteoarthritis, unspecified site: Secondary | ICD-10-CM | POA: Diagnosis not present

## 2019-04-20 DIAGNOSIS — K635 Polyp of colon: Secondary | ICD-10-CM

## 2019-04-20 DIAGNOSIS — I10 Essential (primary) hypertension: Secondary | ICD-10-CM | POA: Diagnosis not present

## 2019-04-20 DIAGNOSIS — Z1211 Encounter for screening for malignant neoplasm of colon: Secondary | ICD-10-CM | POA: Diagnosis not present

## 2019-04-20 HISTORY — PX: COLONOSCOPY WITH PROPOFOL: SHX5780

## 2019-04-20 HISTORY — PX: BIOPSY: SHX5522

## 2019-04-20 HISTORY — PX: POLYPECTOMY: SHX5525

## 2019-04-20 SURGERY — COLONOSCOPY WITH PROPOFOL
Anesthesia: General

## 2019-04-20 MED ORDER — LIDOCAINE HCL (CARDIAC) PF 100 MG/5ML IV SOSY
PREFILLED_SYRINGE | INTRAVENOUS | Status: DC | PRN
  Administered 2019-04-20: 80 mg via INTRAVENOUS

## 2019-04-20 MED ORDER — HYDROMORPHONE HCL 1 MG/ML IJ SOLN: 0.2500 mg | INTRAMUSCULAR | Status: DC | PRN

## 2019-04-20 MED ORDER — LACTATED RINGERS IV SOLN: INTRAVENOUS | Status: DC

## 2019-04-20 MED ORDER — HYDROCODONE-ACETAMINOPHEN 7.5-325 MG PO TABS: 1.0000 | ORAL_TABLET | Freq: Once | ORAL | Status: DC | PRN

## 2019-04-20 MED ORDER — PROMETHAZINE HCL 25 MG/ML IJ SOLN: 6.2500 mg | INTRAMUSCULAR | Status: DC | PRN

## 2019-04-20 MED ORDER — KETAMINE HCL 50 MG/5ML IJ SOSY
PREFILLED_SYRINGE | INTRAMUSCULAR | Status: AC
  Filled 2019-04-20: qty 5

## 2019-04-20 MED ORDER — PROPOFOL 10 MG/ML IV BOLUS
INTRAVENOUS | Status: DC | PRN
  Administered 2019-04-20: 20 mg via INTRAVENOUS

## 2019-04-20 MED ORDER — PROPOFOL 500 MG/50ML IV EMUL
INTRAVENOUS | Status: DC | PRN
  Administered 2019-04-20: 75 ug/kg/min via INTRAVENOUS
  Administered 2019-04-20: 175 ug/kg/min via INTRAVENOUS

## 2019-04-20 MED ORDER — KETAMINE HCL 10 MG/ML IJ SOLN
INTRAMUSCULAR | Status: DC | PRN
  Administered 2019-04-20: 30 mg via INTRAVENOUS

## 2019-04-20 MED ORDER — MIDAZOLAM HCL 2 MG/2ML IJ SOLN: 0.5000 mg | Freq: Once | INTRAMUSCULAR | Status: DC | PRN

## 2019-04-20 MED ORDER — STERILE WATER FOR IRRIGATION IR SOLN
Status: DC | PRN
  Administered 2019-04-20: 100 mL

## 2019-04-20 MED ORDER — GLYCOPYRROLATE 0.2 MG/ML IJ SOLN
INTRAMUSCULAR | Status: DC | PRN
  Administered 2019-04-20: .2 mg via INTRAVENOUS

## 2019-04-20 NOTE — Anesthesia Postprocedure Evaluation (Signed)
Anesthesia Post Note  Patient: Xavier Daniels  Procedure(s) Performed: COLONOSCOPY WITH PROPOFOL (N/A ) POLYPECTOMY BIOPSY  Anesthesia Type: General Level of consciousness: awake and alert and oriented Pain management: pain level controlled Vital Signs Assessment: post-procedure vital signs reviewed and stable Respiratory status: spontaneous breathing Cardiovascular status: stable Postop Assessment: no apparent nausea or vomiting Anesthetic complications: no     Last Vitals:  Vitals:   04/20/19 0751  BP: (!) 140/103  Pulse: 88  Resp: 20  Temp: 37 C  SpO2: 96%    Last Pain:  Vitals:   04/20/19 0948  PainSc: 0-No pain                 Riki Berninger A

## 2019-04-20 NOTE — Discharge Instructions (Addendum)
You had 3 small polyps removed. You have SMALL internal hemorrhoids.   DRINK WATER TO KEEP YOUR URINE LIGHT YELLOW.  FOLLOW A HIGH FIBER DIET. AVOID ITEMS THAT CAUSE BLOATING & GAS. SEE INFO BELOW.   USE PREPARATION H FOUR TIMES  A DAY IF NEEDED TO RELIEVE RECTAL PAIN/PRESSURE/BLEEDING.    YOUR BIOPSY RESULTS WILL BE BACK IN 5 BUSINESS DAYS.  Next colonoscopy AS EARLY AS JAN 2026 AND AS LATE AS JAN 2028.    Colonoscopy Care After Read the instructions outlined below and refer to this sheet in the next week. These discharge instructions provide you with general information on caring for yourself after you leave the hospital. While your treatment has been planned according to the most current medical practices available, unavoidable complications occasionally occur. If you have any problems or questions after discharge, call DR. Sharnae Winfree, [phone].  ACTIVITY  You may resume your regular activity, but move at a slower pace for the next 24 hours.   Take frequent rest periods for the next 24 hours.   Walking will help get rid of the air and reduce the bloated feeling in your belly (abdomen).   No driving for 24 hours (because of the medicine (anesthesia) used during the test).   You may shower.   Do not sign any important legal documents or operate any machinery for 24 hours (because of the anesthesia used during the test).    NUTRITION  Drink plenty of fluids.   You may resume your normal diet as instructed by your doctor.   Begin with a light meal and progress to your normal diet. Heavy or fried foods are harder to digest and may make you feel sick to your stomach (nauseated).   Avoid alcoholic beverages for 24 hours or as instructed.    MEDICATIONS  You may resume your normal medications.   WHAT YOU CAN EXPECT TODAY  Some feelings of bloating in the abdomen.   Passage of more gas than usual.   Spotting of blood in your stool or on the toilet paper  .  IF  YOU HAD POLYPS REMOVED DURING THE COLONOSCOPY:  Eat a soft diet IF YOU HAVE NAUSEA, BLOATING, ABDOMINAL PAIN, OR VOMITING.    FINDING OUT THE RESULTS OF YOUR TEST Not all test results are available during your visit. DR. Oneida Alar WILL CALL YOU WITHIN 14 DAYS OF YOUR PROCEDUE WITH YOUR RESULTS. Do not assume everything is normal if you have not heard from DR. Larya Charpentier, CALL HER OFFICE AT [phone].  SEEK IMMEDIATE MEDICAL ATTENTION AND CALL THE OFFICE: [phone] IF:  You have more than a spotting of blood in your stool.   Your belly is swollen (abdominal distention).   You are nauseated or vomiting.   You have a temperature over 101F.   You have abdominal pain or discomfort that is severe or gets worse throughout the day.   High-Fiber Diet A high-fiber diet changes your normal diet to include more whole grains, legumes, fruits, and vegetables. Changes in the diet involve replacing refined carbohydrates with unrefined foods. The calorie level of the diet is essentially unchanged. The Dietary Reference Intake (recommended amount) for adult males is 38 grams per day. For adult females, it is 25 grams per day. Pregnant and lactating women should consume 28 grams of fiber per day. Fiber is the intact part of a plant that is not broken down during digestion. Functional fiber is fiber that has been isolated from the plant to provide a beneficial  effect in the body. PURPOSE  Increase stool bulk.   Ease and regulate bowel movements.   Lower cholesterol.   REDUCE RISK OF COLON CANCER  INDICATIONS THAT YOU NEED MORE FIBER  Constipation and hemorrhoids.   Uncomplicated diverticulosis (intestine condition) and irritable bowel syndrome.   Weight management.   As a protective measure against hardening of the arteries (atherosclerosis), diabetes, and cancer.   GUIDELINES FOR INCREASING FIBER IN THE DIET  Start adding fiber to the diet slowly. A gradual increase of about 5 more grams (2  slices of whole-wheat bread, 2 servings of most fruits or vegetables, or 1 bowl of high-fiber cereal) per day is best. Too rapid an increase in fiber may result in constipation, flatulence, and bloating.   Drink enough water and fluids to keep your urine clear or pale yellow. Water, juice, or caffeine-free drinks are recommended. Not drinking enough fluid may cause constipation.   Eat a variety of high-fiber foods rather than one type of fiber.   Try to increase your intake of fiber through using high-fiber foods rather than fiber pills or supplements that contain small amounts of fiber.   The goal is to change the types of food eaten. Do not supplement your present diet with high-fiber foods, but replace foods in your present diet.   INCLUDE A VARIETY OF FIBER SOURCES  Replace refined and processed grains with whole grains, canned fruits with fresh fruits, and incorporate other fiber sources. White rice, white breads, and most bakery goods contain little or no fiber.   Brown whole-grain rice, buckwheat oats, and many fruits and vegetables are all good sources of fiber. These include: broccoli, Brussels sprouts, cabbage, cauliflower, beets, sweet potatoes, white potatoes (skin on), carrots, tomatoes, eggplant, squash, berries, fresh fruits, and dried fruits.   Cereals appear to be the richest source of fiber. Cereal fiber is found in whole grains and bran. Bran is the fiber-rich outer coat of cereal grain, which is largely removed in refining. In whole-grain cereals, the bran remains. In breakfast cereals, the largest amount of fiber is found in those with "bran" in their names. The fiber content is sometimes indicated on the label.   You may need to include additional fruits and vegetables each day.   In baking, for 1 cup white flour, you may use the following substitutions:   1 cup whole-wheat flour minus 2 tablespoons.   1/2 cup white flour plus 1/2 cup whole-wheat flour.   Polyps, Colon   A polyp is extra tissue that grows inside your body. Colon polyps grow in the large intestine. The large intestine, also called the colon, is part of your digestive system. It is a long, hollow tube at the end of your digestive tract where your body makes and stores stool. Most polyps are not dangerous. They are benign. This means they are not cancerous. But over time, some types of polyps can turn into cancer. Polyps that are smaller than a pea are usually not harmful. But larger polyps could someday become or may already be cancerous. To be safe, doctors remove all polyps and test them.   WHO GETS POLYPS? Anyone can get polyps, but certain people are more likely than others. You may have a greater chance of getting polyps if:  You are over 50.   You have had polyps before.   Someone in your family has had polyps.   Someone in your family has had cancer of the large intestine.   Find out if  someone in your family has had polyps. You may also be more likely to get polyps if you:   Eat a lot of fatty foods   Smoke   Drink alcohol   Do not exercise  Eat too much   PREVENTION There is not one sure way to prevent polyps. You might be able to lower your risk of getting them if you:  Eat more fruits and vegetables and less fatty food.   Do not smoke.   Avoid alcohol.   Exercise every day.   Lose weight if you are overweight.   Eating more calcium and folate can also lower your risk of getting polyps. Some foods that are rich in calcium are milk, cheese, and broccoli. Some foods that are rich in folate are chickpeas, kidney beans, and spinach.

## 2019-04-20 NOTE — Anesthesia Procedure Notes (Signed)
Procedure Name: MAC Date/Time: 04/20/2019 9:09 AM Performed by: Andree Elk Josph [place]fleet A, CRNA Pre-anesthesia Checklist: Patient identified, Emergency Drugs available, Suction available, Timeout performed and Patient being monitored Patient Re-evaluated:Patient Re-evaluated prior to induction Oxygen Delivery Method: Non-rebreather mask

## 2019-04-20 NOTE — Anesthesia Preprocedure Evaluation (Signed)
Anesthesia Evaluation  Patient identified by MRN, date of birth, ID band Patient awake    Reviewed: Allergy & Precautions, NPO status , Patient's Chart, lab work & pertinent test results  Airway Mallampati: II  TM Distance: >3 FB Neck ROM: Full    Dental no notable dental hx. (+) Partial Upper   Pulmonary neg pulmonary ROS,  Had CXR in 12/2018 showing stable hilar fullness  Denies any known pulm issues otherwise  Denies any smoking or inhaler/o2 use    Pulmonary exam normal breath sounds clear to auscultation       Cardiovascular Exercise Tolerance: Good hypertension, Pt. on medications negative cardio ROS Normal cardiovascular examI Rhythm:Regular Rate:Normal     Neuro/Psych negative neurological ROS  negative psych ROS   GI/Hepatic negative GI ROS, Neg liver ROS, Bowel prep,Here for screening colonoscopy    Endo/Other  negative endocrine ROS  Renal/GU negative Renal ROS  negative genitourinary   Musculoskeletal  (+) Arthritis , Osteoarthritis,  Gout on meds    Abdominal   Peds negative pediatric ROS (+)  Hematology negative hematology ROS (+)   Anesthesia Other Findings   Reproductive/Obstetrics negative OB ROS                             Anesthesia Physical Anesthesia Plan  ASA: II  Anesthesia Plan: General   Post-op Pain Management:    Induction: Intravenous  PONV Risk Score and Plan: 2 and Propofol infusion and TIVA  Airway Management Planned: Nasal Cannula and Simple Face Mask  Additional Equipment:   Intra-op Plan:   Post-operative Plan:   Informed Consent: I have reviewed the patients History and Physical, chart, labs and discussed the procedure including the risks, benefits and alternatives for the proposed anesthesia with the patient or authorized representative who has indicated his/her understanding and acceptance.     Dental advisory given  Plan  Discussed with: CRNA  Anesthesia Plan Comments: (Plan Full PPE use  Plan GA with GETA as needed d/w pt -WTP with same after Q&A)        Anesthesia Quick Evaluation

## 2019-04-20 NOTE — Transfer of Care (Signed)
Immediate Anesthesia Transfer of Care Note  Patient: Xavier Daniels  Procedure(s) Performed: COLONOSCOPY WITH PROPOFOL (N/A ) POLYPECTOMY BIOPSY  Patient Location: PACU  Anesthesia Type:General  Level of Consciousness: awake, alert , oriented and patient cooperative  Airway & Oxygen Therapy: Patient Spontanous Breathing  Post-op Assessment: Report given to RN and Post -op Vital signs reviewed and stable  Post vital signs: Reviewed and stable  Last Vitals:  Vitals Value Taken Time  BP 91/73 04/20/19 0952  Temp    Pulse 86 04/20/19 0954  Resp 16 04/20/19 0954  SpO2 90 % 04/20/19 0954  Vitals shown include unvalidated device data.  Last Pain:  Vitals:   04/20/19 0948  PainSc: 0-No pain         Complications: No apparent anesthesia complications

## 2019-04-20 NOTE — H&P (Signed)
Primary Care Physician:  Celene Squibb, MD Primary Gastroenterologist:  Dr. Oneida Alar  Pre-Procedure History & Physical: HPI:  Xavier Daniels is a 54 y.o. male here for [place].  Past Medical History:  Diagnosis Date  . Arthritis   . Gout   . Gout   . Hypertension   . 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 TOTAL HIP ARTHROPLASTY  2007   due to injury for avascular necrosis.    Prior to Admission medications   Medication Sig Start Date End Date Taking? Authorizing Provider  atenolol (TENORMIN) 100 MG tablet TAKE ONE (1) TABLET BY MOUTH EVERY DAY Patient taking differently: Take 100 mg by mouth daily.  10/08/16  Yes [place], Modena Nunnery, MD  colchicine 0.6 MG tablet Take 0.6 mg by mouth 2 (two) times daily as needed (gout).    Yes [provider]  febuxostat (ULORIC) 40 MG tablet Take 40 mg by mouth daily.   Yes [provider]  HYDROcodone-acetaminophen (NORCO) 7.5-325 MG tablet Take 1 tablet by mouth every 6 (six) hours as needed for moderate pain.   Yes [provider]  ibuprofen (ADVIL) 800 MG tablet Take 800 mg by mouth every 8 (eight) hours as needed (pain).    Yes [provider]  losartan (COZAAR) 50 MG tablet Take 50 mg by mouth daily.   Yes [provider]  Na Sulfate-K Sulfate-Mg Sulf 17.5-3.13-1.6 GM/177ML SOLN USE AS DIRECTED. 04/15/19  Yes Kambryn Dapolito L, MD  diclofenac (VOLTAREN) 75 MG EC tablet Take 1 tablet (75 mg total) by mouth 2 (two) times daily. Patient taking differently: Take 75 mg by mouth 2 (two) times daily as needed (pain).  12/30/18   Fransico Meadow, PA-C  indomethacin (INDOCIN) 25 MG capsule 50mg  PO TID x2 days, then 25mg  PO TID x3 days Patient taking differently: Take 50 mg by mouth daily as needed (pain).  05/20/18   Francine Graven, DO  tiZANidine (ZANAFLEX) 4 MG tablet Take 4 mg by mouth every 6 (six) hours  as needed for muscle spasms.    [provider]    Allergies as of 11/03/2018  . (No Known Allergies)    Family History  Problem Relation Age of Onset  . Diabetes Mother   . Diabetes Brother     Social History   Socioeconomic History  . Marital status: Married    Spouse name: Not on file  . Number of children: Not on file  . Years of education: Not on file  . Highest education level: Not on file  Occupational History  . Not on file  Tobacco Use  . Smoking status: Never Smoker  . Smokeless tobacco: Never Used  Substance and Sexual Activity  . Alcohol use: No    Comment: previous heavy use, quit 2004  . Drug use: No    Comment: previous marijuana use  . Sexual activity: Yes    Birth control/protection: None  Other Topics Concern  . Not on file  Social History Narrative  . Not on file   Social Determinants of Health   Financial Resource Strain:   . Difficulty of Paying Living Expenses: Not on file  Food Insecurity:   . Worried About Charity fundraiser in the Last Year: Not on file  . Ran Out of Food in the Last Year: Not on file  Transportation Needs:   .  Lack of Transportation (Medical): Not on file  . Lack of Transportation (Non-Medical): Not on file  Physical Activity:   . Days of Exercise per Week: Not on file  . Minutes of Exercise per Session: Not on file  Stress:   . Feeling of Stress : Not on file  Social Connections:   . Frequency of Communication with Friends and Family: Not on file  . Frequency of Social Gatherings with Friends and Family: Not on file  . Attends Religious Services: Not on file  . Active Member of Clubs or Organizations: Not on file  . Attends Archivist Meetings: Not on file  . Marital Status: Not on file  Intimate Partner Violence:   . Fear of Current or Ex-Partner: Not on file  . Emotionally Abused: Not on file  . Physically Abused: Not on file  . Sexually Abused: Not on file    Review of Systems: See  HPI, otherwise negative ROS   Physical Exam: BP (!) 140/103 (BP Location: Left Arm)   Pulse 88   Temp 98.6 F (37 C)   Resp 20   SpO2 96%  General:   Alert,  pleasant and cooperative in NAD Head:  Normocephalic and atraumatic. Neck:  Supple; Lungs:  Clear throughout to auscultation.    Heart:  Regular rate and rhythm. Abdomen:  Soft, nontender and nondistended. Normal bowel sounds, without guarding, and without rebound.   Neurologic:  Alert and  oriented x4;  grossly normal neurologically.  Impression/Plan:    SCREENING  Plan:  1. TCS TODAY DISCUSSED PROCEDURE, BENEFITS, & RISKS: < 1% chance of medication reaction, bleeding, perforation, ASPIRATION, or rupture of spleen/liver requiring surgery to fix it and missed polyps < 1 cm 10-20% of the time.

## 2019-04-20 NOTE — Op Note (Signed)
[facility] Patient Name: Xavier Daniels Procedure Date: 04/20/2019 8:45 AM MRN: [id number] Date of Birth: 12/16/1965 Attending MD: Barney Drain MD, MD CSN: [id number] Age: 54 Admit Type: Outpatient Procedure:                Colonoscopy WITH COLD FORCEPS/COLD SNARE POLYPECTOMY Indications:              Screening for colorectal malignant neoplasm Providers:                Barney Drain MD, MD, Charlsie Quest. Theda Sers RN, RN,                            Rosina Lowenstein, RN Referring MD:             Edwinna Areola. Hall MD Medicines:                Propofol per Anesthesia Complications:            No immediate complications. Estimated Blood Loss:     Estimated blood loss was minimal. Procedure:                Pre-Anesthesia Assessment:                           - Prior to the procedure, a History and Physical                            was performed, and patient medications and                            allergies were reviewed. The patient's tolerance of                            previous anesthesia was also reviewed. The risks                            and benefits of the procedure and the sedation                            options and risks were discussed with the patient.                            All questions were answered, and informed consent                            was obtained. Prior Anticoagulants: The patient has                            taken no previous anticoagulant or antiplatelet                            agents except for NSAID medication. ASA Grade                            Assessment: II - A patient with mild systemic  disease. After reviewing the risks and benefits,                            the patient was deemed in satisfactory condition to                            undergo the procedure. After obtaining informed                            consent, the colonoscope was passed under direct                            vision. Throughout the  procedure, the patient's                            blood pressure, pulse, and oxygen saturations were                            monitored continuously. The [id number]                            scope was introduced through the anus and advanced                            to the the cecum, identified by appendiceal orifice                            and ileocecal valve. The patient tolerated the                            procedure well. The quality of the bowel                            preparation was excellent. The ileocecal valve,                            appendiceal orifice, and rectum were photographed.                            The colonoscopy was somewhat difficult due to the                            patient's body habitus. Successful completion of                            the procedure was aided by COLOWRAP. Scope In: 9:20:33 AM Scope Out: [id number] AM Scope Withdrawal Time: 0 hours 24 minutes 29 seconds  Total Procedure Duration: 0 hours 25 minutes 41 seconds  Findings:      A 2 mm polyp was found in the distal transverse colon. The polyp was       sessile. The polyp was removed with a cold biopsy forceps. Resection and       retrieval were complete.      Two sessile polyps were found in  the rectum and distal transverse colon.       The polyps were 3 to 4 mm in size. These polyps were removed with a cold       snare. Resection and retrieval were complete.      The recto-sigmoid colon and sigmoid colon were mildly tortuous.      Internal hemorrhoids were found. Impression:               - One 2 mm polyp in the distal transverse colon,                            removed with a cold biopsy forceps. Resected and                            retrieved.                           - Two 3 to 4 mm polyps in the rectum(BTL 2) and in                            the distal transverse colon, removed with a cold                            snare. Resected and retrieved.                            - Tortuous colon.                           - SMALL internal hemorrhoids. Moderate Sedation:      Per Anesthesia Care Recommendation:           - Patient has a contact number available for                            emergencies. The signs and symptoms of potential                            delayed complications were discussed with the                            patient. Return to normal activities tomorrow.                            Written discharge instructions were provided to the                            patient.                           - High fiber diet. LOSE WEIGHT TO BMI < 30.                           - Continue present medications.                           -  Await pathology results.                           - Repeat colonoscopy in 5-10 years for surveillance. Procedure Code(s):        --- Professional ---                           [phone], Colonoscopy, flexible; with removal of                            tumor(s), polyp(s), or other lesion(s) by snare                            technique                           45380, 28, Colonoscopy, flexible; with biopsy,                            single or multiple Diagnosis Code(s):        --- Professional ---                           Z12.11, Encounter for screening for malignant                            neoplasm of colon                           K63.5, Polyp of colon                           K62.1, Rectal polyp                           K64.8, Other hemorrhoids                           Q43.8, Other specified congenital malformations of                            intestine CPT copyright 2019 [employer]. All rights reserved. The codes documented in this report are preliminary and upon coder review may  be revised to meet current compliance requirements. Barney Drain, MD Barney Drain MD, MD 04/20/2019 10:04:59 AM This report has been signed electronically. Number of Addenda: 0

## 2019-04-21 LAB — SURGICAL PATHOLOGY

## 2019-04-22 ENCOUNTER — Telehealth: Payer: Self-pay | Admitting: Gastroenterology

## 2019-04-22 NOTE — Telephone Encounter (Signed)
Spoke with pt. Pt notified of results and when next TCS should be.

## 2019-04-22 NOTE — Telephone Encounter (Signed)
Reminder in epic °

## 2019-04-22 NOTE — Telephone Encounter (Signed)
Please call pt. TWO SIMPLE ADENOMAS AND ONE HYPERPLASTIC POLYP REMOVED.    DRINK WATER TO KEEP YOUR URINE LIGHT YELLOW. FOLLOW A HIGH FIBER DIET. AVOID ITEMS THAT CAUSE BLOATING & GAS.  USE PREPARATION H FOUR TIMES  A DAY IF NEEDED TO RELIEVE RECTAL PAIN/PRESSURE/BLEEDING.  Next colonoscopy AS EARLY AS JAN 2026 AND NO LATER THAN JAN 2028.

## 2019-07-19 ENCOUNTER — Other Ambulatory Visit: Payer: Self-pay

## 2019-07-19 ENCOUNTER — Encounter: Payer: Self-pay | Admitting: Nurse Practitioner

## 2019-07-19 ENCOUNTER — Ambulatory Visit (INDEPENDENT_AMBULATORY_CARE_PROVIDER_SITE_OTHER): Payer: Medicaid Other | Admitting: Nurse Practitioner

## 2019-07-19 VITALS — BP 140/88 | HR 77 | Temp 97.6°F | Resp 18 | Ht 72.0 in | Wt 258.0 lb

## 2019-07-19 DIAGNOSIS — M1A09X0 Idiopathic chronic gout, multiple sites, without tophus (tophi): Secondary | ICD-10-CM

## 2019-07-19 DIAGNOSIS — Z0001 Encounter for general adult medical examination with abnormal findings: Secondary | ICD-10-CM | POA: Diagnosis not present

## 2019-07-19 DIAGNOSIS — Z125 Encounter for screening for malignant neoplasm of prostate: Secondary | ICD-10-CM | POA: Diagnosis not present

## 2019-07-19 DIAGNOSIS — Z7689 Persons encountering health services in other specified circumstances: Secondary | ICD-10-CM | POA: Diagnosis not present

## 2019-07-19 DIAGNOSIS — I10 Essential (primary) hypertension: Secondary | ICD-10-CM

## 2019-07-19 DIAGNOSIS — R7309 Other abnormal glucose: Secondary | ICD-10-CM

## 2019-07-19 DIAGNOSIS — E785 Hyperlipidemia, unspecified: Secondary | ICD-10-CM | POA: Diagnosis not present

## 2019-07-19 MED ORDER — FEBUXOSTAT 40 MG PO TABS: 40.0000 mg | ORAL_TABLET | Freq: Every day | ORAL | 2 refills | Status: DC

## 2019-07-19 NOTE — Addendum Note (Signed)
Addended by: Ishmael Holter on: 07/19/2019 04:23 PM   Modules accepted: Level of Service

## 2019-07-19 NOTE — Progress Notes (Signed)
New Patient Office Visit  Subjective:  Patient ID: Xavier Daniels, male    DOB: February 28, 1966  Age: 54 y.o. MRN: :[id number]  CC:  Chief Complaint  Patient presents with  . Establish Care    NP    HPI Xavier Daniels is a 54 year old male presenting to establish care. Health history and medications discussed and reviewed. He was a pt in this clinic last seen 3 years ago. Pt was COVID positive in 01/2019. No cp/ct, gu/gi sxs, pain, edema, sob, or falls.   Cscope: completed 04/20/2019  Flu: 11/2019 Tdap: had 5 years ago per pt COVID vaccine: considering ETOH/Drug:low Depression:low  Past Medical History:  Diagnosis Date  . Arthritis   . Colon cancer screening 10/20/2018  . Gout   . Gout   . Hypertension   . Substance abuse ([place])    ETOH, 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, CLEAN FOR 13 YEARS     Past Surgical History:  Procedure Laterality Date  . BIOPSY  04/20/2019   Procedure: BIOPSY;  Surgeon: Danie Binder, MD;  [facility];  Service: Endoscopy;;  transverse polyp x1  . COLONOSCOPY WITH PROPOFOL N/A 04/20/2019   Procedure: COLONOSCOPY WITH PROPOFOL;  Surgeon: Danie Binder, MD;  [facility];  Service: Endoscopy;  Laterality: N/A;  9:30am-office rescheduled 04/20/19 @ 8:30am  . POLYPECTOMY  04/20/2019   Procedure: POLYPECTOMY;  Surgeon: Danie Binder, MD;  [facility];  Service: Endoscopy;;  transversex1, rectal polyp  . TOTAL HIP ARTHROPLASTY  2007   due to injury for avascular necrosis.    Family History  Problem Relation Age of Onset  . Diabetes Mother   . Diabetes Brother     Social History   Socioeconomic History  . Marital status: Married    Spouse name: Not on file  . Number of children: Not on file  . Years of education: Not on file  . Highest education level: Not on file  Occupational History  . Not on file  Tobacco Use  . Smoking status: Never Smoker  . Smokeless tobacco: Never Used  Substance and Sexual Activity  . Alcohol  use: No    Comment: previous heavy use, quit 2004  . Drug use: No    Comment: previous marijuana use  . Sexual activity: Yes    Birth control/protection: None  Other Topics Concern  . Not on file  Social History Narrative  . Not on file   Social Determinants of Health   Financial Resource Strain:   . Difficulty of Paying Living Expenses:   Food Insecurity:   . Worried About Charity fundraiser in the Last Year:   . Arboriculturist in the Last Year:   Transportation Needs:   . Film/video editor (Medical):   Marland Kitchen Lack of Transportation (Non-Medical):   Physical Activity:   . Days of Exercise per Week:   . Minutes of Exercise per Session:   Stress:   . Feeling of Stress :   Social Connections:   . Frequency of Communication with Friends and Family:   . Frequency of Social Gatherings with Friends and Family:   . Attends Religious Services:   . Active Member of Clubs or Organizations:   . Attends Archivist Meetings:   Marland Kitchen Marital Status:   Intimate Partner Violence:   . Fear of Current or Ex-Partner:   . Emotionally Abused:   Marland Kitchen Physically Abused:   . Sexually  Abused:     ROS Review of Systems  All other systems reviewed and are negative.   Objective:   Today's Vitals: BP 140/88 (BP Location: Left Arm, Patient Position: Sitting, Cuff Size: Large)   Pulse 77   Temp 97.6 F (36.4 C) (Temporal)   Resp 18   Ht 6' (1.829 m)   Wt 258 lb (117 kg)   SpO2 96%   BMI 34.99 kg/m   Physical Exam Vitals and nursing note reviewed.  Constitutional:      Appearance: Normal appearance. He is well-developed and well-groomed.  HENT:     Head: Normocephalic.     Right Ear: Hearing and external ear normal.     Left Ear: Hearing and external ear normal.     Nose: Nose normal.     Mouth/Throat:     Lips: Pink.     Mouth: Mucous membranes are moist.     Dentition: Has dentures.     Pharynx: Oropharynx is clear.     Comments: Upper dentures only Eyes:     General:  Lids are normal. Lids are everted, no foreign bodies appreciated.     Extraocular Movements: Extraocular movements intact.     Conjunctiva/sclera: Conjunctivae normal.     Pupils: Pupils are equal, round, and reactive to light.  Neck:     Thyroid: No thyroid mass, thyromegaly or thyroid tenderness.     Vascular: No carotid bruit or JVD.  Cardiovascular:     Rate and Rhythm: Normal rate and regular rhythm.     Pulses: Normal pulses.     Heart sounds: Normal heart sounds, S1 normal and S2 normal.  Pulmonary:     Effort: Pulmonary effort is normal.     Breath sounds: Normal breath sounds.  Chest:     Chest wall: No tenderness.  Abdominal:     General: Abdomen is flat. Bowel sounds are normal. There is no abdominal bruit.     Palpations: Abdomen is soft. There is no hepatomegaly or splenomegaly.  Musculoskeletal:        General: Normal range of motion.     Cervical back: Normal range of motion and neck supple.     Right lower leg: No edema.     Left lower leg: No edema.  Lymphadenopathy:     Cervical: No cervical adenopathy.     Right cervical: No superficial cervical adenopathy.    Left cervical: No superficial cervical adenopathy.  Skin:    General: Skin is warm and dry.     Capillary Refill: Capillary refill takes less than 2 seconds.  Neurological:     General: No focal deficit present.     Mental Status: He is alert and oriented to person, place, and time.  Psychiatric:        Attention and Perception: Attention normal.        Mood and Affect: Mood normal.        Speech: Speech normal.        Behavior: Behavior normal. Behavior is cooperative.        Cognition and Memory: Cognition normal.        Judgment: Judgment normal.     Assessment & Plan:  Established care with lab draw today Elevated glucose trend on prior labs: A1C Gout medication refill per pt need only Uloric HTN: take at home 2-3 hours after medication and notify office/seek medical attn for  >140/90 Recommend low cholester/low fat diet, low sugar/low carbs.  20 minutes of exercise  at least 4 days per week.  Problem List Items Addressed This Visit      Cardiovascular and Mediastinum   Essential hypertension   Relevant Orders   Lipid panel   PSA   CBC with Differential/Platelet   COMPLETE METABOLIC PANEL WITH GFR   Hemoglobin A1c     Other   Hyperlipidemia   Relevant Orders   Lipid panel   PSA   CBC with Differential/Platelet   COMPLETE METABOLIC PANEL WITH GFR   Hemoglobin A1c   Gout   Relevant Medications   febuxostat (ULORIC) 40 MG tablet   Other Relevant Orders   Lipid panel   PSA   CBC with Differential/Platelet   COMPLETE METABOLIC PANEL WITH GFR   Hemoglobin A1c    Other Visit Diagnoses    Establishing care with new doctor, encounter for    -  Primary   Relevant Orders   Lipid panel   PSA   CBC with Differential/Platelet   COMPLETE METABOLIC PANEL WITH GFR   Hemoglobin A1c   Prostate cancer screening       Relevant Orders   Lipid panel   PSA   CBC with Differential/Platelet   COMPLETE METABOLIC PANEL WITH GFR   Hemoglobin A1c   Elevated glucose       Relevant Orders   Lipid panel   PSA   CBC with Differential/Platelet   COMPLETE METABOLIC PANEL WITH GFR   Hemoglobin A1c     No Known Allergies  Outpatient Encounter Medications as of 07/19/2019  Medication Sig  . atenolol (TENORMIN) 100 MG tablet TAKE ONE (1) TABLET BY MOUTH EVERY DAY (Patient taking differently: Take 100 mg by mouth daily. )  . colchicine 0.6 MG tablet Take 0.6 mg by mouth 2 (two) times daily as needed (gout).   Marland Kitchen diclofenac (VOLTAREN) 75 MG EC tablet Take 1 tablet (75 mg total) by mouth 2 (two) times daily. (Patient taking differently: Take 75 mg by mouth 2 (two) times daily as needed (pain). )  . febuxostat (ULORIC) 40 MG tablet Take 1 tablet (40 mg total) by mouth daily.  Marland Kitchen ibuprofen (ADVIL) 800 MG tablet Take 800 mg by mouth every 8 (eight) hours as needed (pain).    . indomethacin (INDOCIN) 25 MG capsule 50mg  PO TID x2 days, then 25mg  PO TID x3 days (Patient taking differently: Take 50 mg by mouth daily as needed (pain). )  . losartan (COZAAR) 50 MG tablet Take 50 mg by mouth daily.  . [DISCONTINUED] febuxostat (ULORIC) 40 MG tablet Take 40 mg by mouth daily.  . [DISCONTINUED] HYDROcodone-acetaminophen (NORCO) 7.5-325 MG tablet Take 1 tablet by mouth every 6 (six) hours as needed for moderate pain.  . [DISCONTINUED] tiZANidine (ZANAFLEX) 4 MG tablet Take 4 mg by mouth every 6 (six) hours as needed for muscle spasms.   No facility-administered encounter medications on file as of 07/19/2019.    Follow-up: Return in about 6 months (around 01/18/2020) for labs one week prior to apt.   Annie Main, FNP

## 2019-07-20 LAB — CBC WITH DIFFERENTIAL/PLATELET
Absolute Monocytes: 380 cells/uL (ref 200–950)
Basophils Absolute: 48 cells/uL (ref 0–200)
Basophils Relative: 1.2 %
Eosinophils Absolute: 120 cells/uL (ref 15–500)
Eosinophils Relative: 3 %
HCT: 41.6 % (ref 38.5–50.0)
Hemoglobin: 14 g/dL (ref 13.2–17.1)
Lymphs Abs: 2228 cells/uL (ref 850–3900)
MCH: 29.3 pg (ref 27.0–33.0)
MCHC: 33.7 g/dL (ref 32.0–36.0)
MCV: 87 fL (ref 80.0–100.0)
MPV: 11.1 fL (ref 7.5–12.5)
Monocytes Relative: 9.5 %
Neutro Abs: 1224 cells/uL — ABNORMAL LOW (ref 1500–7800)
Neutrophils Relative %: 30.6 %
Platelets: 213 10*3/uL (ref 140–400)
RBC: 4.78 10*6/uL (ref 4.20–5.80)
RDW: 13.7 % (ref 11.0–15.0)
Total Lymphocyte: 55.7 %
WBC: 4 10*3/uL (ref 3.8–10.8)

## 2019-07-20 LAB — COMPLETE METABOLIC PANEL WITH GFR
AG Ratio: 1.7 (calc) (ref 1.0–2.5)
ALT: 25 U/L (ref 9–46)
AST: 20 U/L (ref 10–35)
Albumin: 4.5 g/dL (ref 3.6–5.1)
Alkaline phosphatase (APISO): 72 U/L (ref 35–144)
BUN: 10 mg/dL (ref 7–25)
CO2: 26 mmol/L (ref 20–32)
Calcium: 9.4 mg/dL (ref 8.6–10.3)
Chloride: 105 mmol/L (ref 98–110)
Creat: 1.09 mg/dL (ref 0.70–1.33)
GFR, Est African American: 89 mL/min/{1.73_m2} (ref >=60)
GFR, Est Non African American: 77 mL/min/{1.73_m2} (ref >=60)
Globulin: 2.7 g/dL (calc) (ref 1.9–3.7)
Glucose, Bld: 119 mg/dL — ABNORMAL HIGH (ref 65–99)
Potassium: 4.3 mmol/L (ref 3.5–5.3)
Sodium: 140 mmol/L (ref 135–146)
Total Bilirubin: 0.7 mg/dL (ref 0.2–1.2)
Total Protein: 7.2 g/dL (ref 6.1–8.1)

## 2019-07-20 LAB — LIPID PANEL
Cholesterol: 230 mg/dL — ABNORMAL HIGH (ref <200)
HDL: 38 mg/dL — ABNORMAL LOW (ref >=40)
LDL Cholesterol (Calc): 154 mg/dL (calc) — ABNORMAL HIGH
Non-HDL Cholesterol (Calc): 192 mg/dL (calc) — ABNORMAL HIGH (ref <130)
Total CHOL/HDL Ratio: 6.1 (calc) — ABNORMAL HIGH (ref <5.0)
Triglycerides: 226 mg/dL — ABNORMAL HIGH (ref <150)

## 2019-07-20 LAB — PSA: PSA: 0.5 ng/mL (ref <=4.0)

## 2019-07-20 LAB — HEMOGLOBIN A1C
Hgb A1c MFr Bld: 6.7 % of total Hgb — ABNORMAL HIGH (ref <5.7)
Mean Plasma Glucose: 146 (calc)
eAG (mmol/L): 8.1 (calc)

## 2019-07-22 ENCOUNTER — Other Ambulatory Visit: Payer: Self-pay | Admitting: Nurse Practitioner

## 2019-07-22 DIAGNOSIS — E785 Hyperlipidemia, unspecified: Secondary | ICD-10-CM

## 2019-07-22 DIAGNOSIS — E119 Type 2 diabetes mellitus without complications: Secondary | ICD-10-CM

## 2019-07-22 MED ORDER — PRAVASTATIN SODIUM 20 MG PO TABS: 20.0000 mg | ORAL_TABLET | Freq: Every day | ORAL | 3 refills | Status: DC

## 2019-07-22 NOTE — Progress Notes (Signed)
Glucose elevated: a1c 6.7 good He is a Dm2, we want to keep below 7 repeat a1c in 4 months. Diet education please.  Cholesterol and triglycerides are too high I will prescribe a statin. Educate on diet and exercise please.

## 2019-07-28 ENCOUNTER — Telehealth: Payer: Self-pay | Admitting: Nurse Practitioner

## 2019-07-28 NOTE — Telephone Encounter (Signed)
error 

## 2019-08-10 ENCOUNTER — Ambulatory Visit (HOSPITAL_COMMUNITY)
Admission: RE | Admit: 2019-08-10 | Discharge: 2019-08-10 | Disposition: A | Discharge status: Home / Self Care | Payer: Medicaid Other | Source: Ambulatory Visit | Attending: Nurse Practitioner | Admitting: Nurse Practitioner

## 2019-08-10 ENCOUNTER — Ambulatory Visit (INDEPENDENT_AMBULATORY_CARE_PROVIDER_SITE_OTHER): Payer: Medicaid Other | Admitting: Nurse Practitioner

## 2019-08-10 ENCOUNTER — Other Ambulatory Visit: Payer: Self-pay

## 2019-08-10 VITALS — BP 104/70 | HR 85 | Temp 97.6°F | Resp 18 | Wt 252.0 lb

## 2019-08-10 DIAGNOSIS — M25572 Pain in left ankle and joints of left foot: Secondary | ICD-10-CM

## 2019-08-10 DIAGNOSIS — M19072 Primary osteoarthritis, left ankle and foot: Secondary | ICD-10-CM | POA: Diagnosis not present

## 2019-08-10 MED ORDER — DICLOFENAC SODIUM 1 % EX GEL: 2.0000 g | Freq: Four times a day (QID) | CUTANEOUS | 0 refills | Status: DC

## 2019-08-10 NOTE — Patient Instructions (Signed)
Left ankle pain at the region of the peronis brevis that continue with once a day use of diclofenac topical.  Plan:  May take NSAID'S by mouth such as ibuprofen, naproxen and use diclofenac as directed you are not using enough for therapeutic response.  Complete Xray of the left ankle today. We will call you with results and directions Avoid activities that exacerbate sxs such as standing for long periods of time.  Rest, Ice, Elevation

## 2019-08-10 NOTE — Progress Notes (Signed)
Acute Office Visit  Subjective:    Patient ID: Xavier Daniels, male    DOB: [birth year], 54 y.o.   MRN: [id number]  Chief Complaint  Patient presents with  . Leg Pain    L lower leg near ankle, x1 week, unsure if injuried, ibuprofen was teken    HPI Patient is a 54 year old male presenting for left ankle pain. The sxs started about a month ago. Exacerbated by standing for long periods, relieved with rest. The pain is described as dull ache. He played basketball the past week and the pain episode increased. He has used diclofenac cream at home once a day with some decrease in occurences.   The pt asked for Hydrocodone RX to have available when he has gout flares. He reports that in the past his prior PCP would prescribe to use as needed for gout and NSAID'S do not work to relieve pain from his gout flares. He reports having 2 pills at home. He was instructed to seek medical attention for gout flares.   Past Medical History:  Diagnosis Date  . Arthritis   . Colon cancer screening 10/20/2018  . Gout   . Gout   . Hypertension   . Substance abuse ([place])    ETOH, 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, CLEAN FOR 13 YEARS    Past Surgical History:  Procedure Laterality Date  . BIOPSY  04/20/2019   Procedure: BIOPSY;  Surgeon: Danie Binder, MD;  [facility];  Service: Endoscopy;;  transverse polyp x1  . COLONOSCOPY WITH PROPOFOL N/A 04/20/2019   Procedure: COLONOSCOPY WITH PROPOFOL;  Surgeon: Danie Binder, MD;  [facility];  Service: Endoscopy;  Laterality: N/A;  9:30am-office rescheduled 04/20/19 @ 8:30am  . POLYPECTOMY  04/20/2019   Procedure: POLYPECTOMY;  Surgeon: Danie Binder, MD;  [facility];  Service: Endoscopy;;  transversex1, rectal polyp  . TOTAL HIP ARTHROPLASTY  2007   due to injury for avascular necrosis.    Family History  Problem Relation Age of Onset  . Diabetes Mother   . Diabetes Brother     Social History   Socioeconomic History  . Marital  status: Married    Spouse name: Not on file  . Number of children: Not on file  . Years of education: Not on file  . Highest education level: Not on file  Occupational History  . Not on file  Tobacco Use  . Smoking status: Never Smoker  . Smokeless tobacco: Never Used  Substance and Sexual Activity  . Alcohol use: No    Comment: previous heavy use, quit 2004  . Drug use: No    Comment: previous marijuana use  . Sexual activity: Yes    Birth control/protection: None  Other Topics Concern  . Not on file  Social History Narrative  . Not on file   Social Determinants of Health   Financial Resource Strain:   . Difficulty of Paying Living Expenses:   Food Insecurity:   . Worried About Charity fundraiser in the Last Year:   . Arboriculturist in the Last Year:   Transportation Needs:   . Film/video editor (Medical):   Marland Kitchen Lack of Transportation (Non-Medical):   Physical Activity:   . Days of Exercise per Week:   . Minutes of Exercise per Session:   Stress:   . Feeling of Stress :   Social Connections:   . Frequency of Communication with Friends and  Family:   . Frequency of Social Gatherings with Friends and Family:   . Attends Religious Services:   . Active Member of Clubs or Organizations:   . Attends Archivist Meetings:   Marland Kitchen Marital Status:   Intimate Partner Violence:   . Fear of Current or Ex-Partner:   . Emotionally Abused:   Marland Kitchen Physically Abused:   . Sexually Abused:     Outpatient Medications Prior to Visit  Medication Sig Dispense Refill  . atenolol (TENORMIN) 100 MG tablet TAKE ONE (1) TABLET BY MOUTH EVERY DAY (Patient taking differently: Take 100 mg by mouth daily. ) 30 tablet 3  . colchicine 0.6 MG tablet Take 0.6 mg by mouth 2 (two) times daily as needed (gout).     . febuxostat (ULORIC) 40 MG tablet Take 1 tablet (40 mg total) by mouth daily. 90 tablet 2  . ibuprofen (ADVIL) 800 MG tablet Take 800 mg by mouth every 8 (eight) hours as needed  (pain).     . indomethacin (INDOCIN) 25 MG capsule 50mg  PO TID x2 days, then 25mg  PO TID x3 days (Patient taking differently: Take 50 mg by mouth daily as needed (pain). ) 21 capsule 0  . losartan (COZAAR) 50 MG tablet Take 50 mg by mouth daily.    . pravastatin (PRAVACHOL) 20 MG tablet Take 1 tablet (20 mg total) by mouth daily. 90 tablet 3  . diclofenac (VOLTAREN) 75 MG EC tablet Take 1 tablet (75 mg total) by mouth 2 (two) times daily. (Patient not taking: Reported on 08/10/2019) 20 tablet 0   No facility-administered medications prior to visit.    No Known Allergies  Review of Systems  All other systems reviewed and are negative.      Objective:    Physical Exam Constitutional:      Appearance: Normal appearance. He is not ill-appearing.  HENT:     Head: Normocephalic.  Eyes:     Extraocular Movements: Extraocular movements intact.     Conjunctiva/sclera: Conjunctivae normal.     Pupils: Pupils are equal, round, and reactive to light.  Cardiovascular:     Rate and Rhythm: Normal rate.  Pulmonary:     Effort: Pulmonary effort is normal.  Musculoskeletal:     Cervical back: Normal range of motion and neck supple.       Legs:     Comments: No erythema, mild edema, normal temperature, no hot spot. Not tender to touch. Region is the peronis brevis.    Skin:    General: Skin is warm and dry.     Capillary Refill: Capillary refill takes less than 2 seconds.  Neurological:     General: No focal deficit present.     Mental Status: He is alert and oriented to person, place, and time.  Psychiatric:        Attention and Perception: Attention normal.        Mood and Affect: Mood normal.        Speech: Speech normal.        Behavior: Behavior normal.        Thought Content: Thought content normal.        Cognition and Memory: Cognition normal.        Judgment: Judgment normal.     BP 104/70 (BP Location: Left Arm, Patient Position: Sitting, Cuff Size: Large)   Pulse 85    Temp 97.6 F (36.4 C) (Temporal)   Resp 18   Wt 252 lb (114.3 kg)  SpO2 96%   BMI 34.18 kg/m  Wt Readings from Last 3 Encounters:  08/10/19 252 lb (114.3 kg)  07/19/19 258 lb (117 kg)  04/14/19 250 lb (113.4 kg)    There are no preventive care reminders to display for this patient.  There are no preventive care reminders to display for this patient.   No results found for: TSH Lab Results  Component Value Date   WBC 4.0 07/19/2019   HGB 14.0 07/19/2019   HCT 41.6 07/19/2019   MCV 87.0 07/19/2019   PLT 213 07/19/2019   Lab Results  Component Value Date   NA 140 07/19/2019   K 4.3 07/19/2019   CO2 26 07/19/2019   GLUCOSE 119 (H) 07/19/2019   BUN 10 07/19/2019   CREATININE 1.09 07/19/2019   BILITOT 0.7 07/19/2019   ALKPHOS 66 04/28/2017   AST 20 07/19/2019   ALT 25 07/19/2019   PROT 7.2 07/19/2019   ALBUMIN 4.2 04/28/2017   CALCIUM 9.4 07/19/2019   ANIONGAP 9 12/30/2018   Lab Results  Component Value Date   CHOL 230 (H) 07/19/2019   Lab Results  Component Value Date   HDL 38 (L) 07/19/2019   Lab Results  Component Value Date   LDLCALC 154 (H) 07/19/2019   Lab Results  Component Value Date   TRIG 226 (H) 07/19/2019   Lab Results  Component Value Date   CHOLHDL 6.1 (H) 07/19/2019   Lab Results  Component Value Date   HGBA1C 6.7 (H) 07/19/2019       Assessment & Plan:  Left ankle pain at the region of the peronis brevis that continue with once a day use of diclofenac topical.  Plan:  May take NSAID'S by mouth such as ibuprofen, naproxen and use diclofenac as directed you are not using enough for therapeutic response.  Complete Xray of the left ankle today. We will call you with results and directions Avoid activities that exacerbate sxs such as standing for long periods of time.  Rest, Ice, Elevation Problem List Items Addressed This Visit    None    Visit Diagnoses    Left ankle pain, unspecified chronicity    -  Primary   Relevant  Medications   diclofenac Sodium (VOLTAREN) 1 % GEL   Other Relevant Orders   DG Ankle Complete Left     Follow Up: as needed for worsening or non resolving symptoms  Annie Main, FNP

## 2019-08-11 ENCOUNTER — Other Ambulatory Visit: Payer: Self-pay | Admitting: Nurse Practitioner

## 2019-08-11 ENCOUNTER — Telehealth: Payer: Self-pay | Admitting: Nurse Practitioner

## 2019-08-11 DIAGNOSIS — M7732 Calcaneal spur, left foot: Secondary | ICD-10-CM

## 2019-08-11 NOTE — Telephone Encounter (Signed)
CB# [phone] Call for xray left ankle  results

## 2019-08-11 NOTE — Progress Notes (Signed)
Let pt know I will refer to ortho  FINDINGS: No acute fracture or dislocation is noted. Small calcaneal spur is noted. Mild tarsal degenerative changes are seen. No soft tissue abnormality is noted.  IMPRESSION: Mild degenerative change without acute abnormality.

## 2019-08-16 ENCOUNTER — Encounter: Payer: Self-pay | Admitting: Orthopedic Surgery

## 2019-08-16 ENCOUNTER — Other Ambulatory Visit: Payer: Self-pay

## 2019-08-16 ENCOUNTER — Ambulatory Visit (INDEPENDENT_AMBULATORY_CARE_PROVIDER_SITE_OTHER): Payer: Medicaid Other | Admitting: Orthopedic Surgery

## 2019-08-16 VITALS — Ht 72.0 in | Wt 252.0 lb

## 2019-08-16 DIAGNOSIS — M79605 Pain in left leg: Secondary | ICD-10-CM

## 2019-08-16 NOTE — Progress Notes (Signed)
Office Visit Note   Patient: Xavier Daniels           Date of Birth: 1965/08/18           MRN: [id number] Visit Date: 08/16/2019              Requested by: Annie Main, FNP [address] [place],  [place] [address] PCP: Annie Main, FNP  Chief Complaint  Patient presents with  . Left Ankle - Pain      HPI: Patient is a 54 year old gentleman who is seen for initial evaluation for anterior compartment left leg pain.  Patient states that he has been having pain over the distal aspect of the anterior compartment radiographs of the ankle were obtained 1 week ago.  Patient does have a history of gout he is on Uloric and he states his last uric acid was 5.  Patient states that he does not eat red meat or seafood.  Assessment & Plan: Visit Diagnoses:  1. Pain in left leg     Plan: Discussed that he could try knee-high compression socks for the swelling in the anterior compartment he has no clinical signs of compartment syndrome at this time no pain in the distribution of the superficial peroneal nerve.  Reevaluate if symptoms recur.  Follow-Up Instructions: Return if symptoms worsen or fail to improve.   Ortho Exam  Patient is alert, oriented, no adenopathy, well-dressed, normal affect, normal respiratory effort. Examination patient does have tightness in the anterior compartment of the left leg there is no pain with dorsiflexion of the ankle.  There is no tenderness to palpation over the anterior compartment and no pain to palpation over the superficial peroneal nerve.  Tibia is nontender to palpation no tenderness along the border of the medial tibia.  Imaging: No results found. No images are attached to the encounter.  Labs: Lab Results  Component Value Date   HGBA1C 6.7 (H) 07/19/2019   LABURIC 5.9 10/31/2014   LABURIC 7.2 09/23/2014   LABURIC 10.6 (H) 06/06/2014   LABORGA NO GROWTH 01/08/2016     Lab Results  Component Value Date   ALBUMIN 4.2  04/28/2017   ALBUMIN 4.1 06/07/2016   ALBUMIN 4.3 04/22/2016   LABURIC 5.9 10/31/2014   LABURIC 7.2 09/23/2014   LABURIC 10.6 (H) 06/06/2014    No results found for: MG No results found for: VD25OH  No results found for: PREALBUMIN CBC EXTENDED Latest Ref Rng & Units 07/19/2019 12/30/2018 04/28/2017  WBC 3.8 - 10.8 Thousand/uL 4.0 4.1 9.4  RBC 4.20 - 5.80 Million/uL 4.78 4.62 4.89  HGB 13.2 - 17.1 g/dL 14.0 13.3 14.5  HCT 38.5 - 50.0 % 41.6 41.3 43.1  PLT 140 - 400 Thousand/uL 213 222 190  NEUTROABS 1,500 - 7,800 cells/uL 1,224(L) 1.7 8.1(H)  LYMPHSABS 850 - 3,900 cells/uL 2,228 2.0 1.0     Body mass index is 34.18 kg/m.  Orders:  No orders of the defined types were placed in this encounter.  No orders of the defined types were placed in this encounter.    Procedures: No procedures performed  Clinical Data: No additional findings.  ROS:  All other systems negative, except as noted in the HPI. Review of Systems  Objective: Vital Signs: Ht 6' (1.829 m)   Wt 252 lb (114.3 kg)   BMI 34.18 kg/m   Specialty Comments:  No specialty comments available.  PMFS History: Patient Active Problem List   Diagnosis Date Noted  .  Essential hypertension 06/06/2014  . Hyperlipidemia 06/06/2014  . Gout 06/06/2014  . Obesity 06/06/2014   Past Medical History:  Diagnosis Date  . Arthritis   . Colon cancer screening 10/20/2018  . Gout   . Gout   . Hypertension   . Substance abuse ([facility])    ETOH, 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, CLEAN FOR 13 YEARS    Family History  Problem Relation Age of Onset  . Diabetes Mother   . Diabetes Brother     Past Surgical History:  Procedure Laterality Date  . BIOPSY  04/20/2019   Procedure: BIOPSY;  Surgeon: Danie Binder, MD;  [facility];  Service: Endoscopy;;  transverse polyp x1  . COLONOSCOPY WITH PROPOFOL N/A 04/20/2019   Procedure: COLONOSCOPY WITH PROPOFOL;  Surgeon: Danie Binder, MD;  [facility];  Service: Endoscopy;   Laterality: N/A;  9:30am-office rescheduled 04/20/19 @ 8:30am  . POLYPECTOMY  04/20/2019   Procedure: POLYPECTOMY;  Surgeon: Danie Binder, MD;  [facility];  Service: Endoscopy;;  transversex1, rectal polyp  . TOTAL HIP ARTHROPLASTY  2007   due to injury for avascular necrosis.   Social History   Occupational History  . Not on file  Tobacco Use  . Smoking status: Never Smoker  . Smokeless tobacco: Never Used  Substance and Sexual Activity  . Alcohol use: No    Comment: previous heavy use, quit 2004  . Drug use: No    Comment: previous marijuana use  . Sexual activity: Yes    Birth control/protection: None

## 2019-08-18 ENCOUNTER — Telehealth: Payer: Self-pay

## 2019-08-18 MED ORDER — HYDROCODONE-ACETAMINOPHEN 5-325 MG PO TABS: 1.0000 | ORAL_TABLET | Freq: Four times a day (QID) | ORAL | 0 refills | Status: DC | PRN

## 2019-08-18 MED ORDER — COLCHICINE 0.6 MG PO TABS: 0.6000 mg | ORAL_TABLET | Freq: Two times a day (BID) | ORAL | 1 refills | Status: DC | PRN

## 2019-08-18 NOTE — Telephone Encounter (Signed)
   I have refilled cochicine, he can take BID for a week, then decrease to 1 a day for a week   I have sent in 15 tabs of hydrocodone   If not improved needs OV

## 2019-08-18 NOTE — Telephone Encounter (Signed)
Pt notified Verbalizes understanding 

## 2019-08-18 NOTE — Telephone Encounter (Signed)
Pt called to report that he is having a gout flare up in his R ankle. Pt states that Dr. Buelah Manis use to put him on hydrocodone when he had gout issues. Pt also states that there is no way he can come in for an OV but would like an Rx. Please advise.

## 2019-08-26 ENCOUNTER — Ambulatory Visit: Payer: Medicaid Other | Admitting: Orthopedic Surgery

## 2019-08-26 ENCOUNTER — Other Ambulatory Visit: Payer: Self-pay

## 2019-08-26 ENCOUNTER — Encounter: Payer: Self-pay | Admitting: Orthopedic Surgery

## 2019-08-26 VITALS — Ht 72.0 in | Wt 252.0 lb

## 2019-08-26 DIAGNOSIS — M79A22 Nontraumatic compartment syndrome of left lower extremity: Secondary | ICD-10-CM | POA: Diagnosis not present

## 2019-08-26 NOTE — Progress Notes (Signed)
Office Visit Note   Patient: Xavier Daniels           Date of Birth: [birth year]           MRN: [id number] Visit Date: 08/26/2019              Requested by: Annie Main, FNP [address] [place],  [place] [address] PCP: Annie Main, FNP  Chief Complaint  Patient presents with  . Left Ankle - Pain      HPI: Patient is a 54 year old gentleman who presents complaining of pain over the anterior compartment left leg with exertion.  Patient denies pain at rest pain worse with prolonged activities.  Pain directly over the superficial peroneal nerve.  Assessment & Plan: Visit Diagnoses:  1. 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  Discussed that if he fails conservative treatment a release of the anterior compartment is an option.  Follow-Up Instructions: Return if symptoms worsen or fail to improve.   Ortho Exam  Patient is alert, oriented, no adenopathy, well-dressed, normal affect, normal respiratory effort. Examination patient has mild lower extremities are neurovascular intact.  There is tightness in both compartments a little bit greater than the left than the right but no significant swelling.  Patient is point tender to palpation over the site of the superficial peroneal nerve exiting the anterior compartment.  No pain with passive range of motion of the ankle.  Patient has tried ibuprofen without relief.  Imaging: No results found. No images are attached to the encounter.  Labs: Lab Results  Component Value Date   HGBA1C 6.7 (H) 07/19/2019   LABURIC 5.9 10/31/2014   LABURIC 7.2 09/23/2014   LABURIC 10.6 (H) 06/06/2014   LABORGA NO GROWTH 01/08/2016     Lab Results  Component Value Date   ALBUMIN 4.2 04/28/2017   ALBUMIN 4.1 06/07/2016   ALBUMIN 4.3 04/22/2016    LABURIC 5.9 10/31/2014   LABURIC 7.2 09/23/2014   LABURIC 10.6 (H) 06/06/2014    No results found for: MG No results found for: VD25OH  No results found for: PREALBUMIN CBC EXTENDED Latest Ref Rng & Units 07/19/2019 12/30/2018 04/28/2017  WBC 3.8 - 10.8 Thousand/uL 4.0 4.1 9.4  RBC 4.20 - 5.80 Million/uL 4.78 4.62 4.89  HGB 13.2 - 17.1 g/dL 14.0 13.3 14.5  HCT 38.5 - 50.0 % 41.6 41.3 43.1  PLT 140 - 400 Thousand/uL 213 222 190  NEUTROABS 1,500 - 7,800 cells/uL 1,224(L) 1.7 8.1(H)  LYMPHSABS 850 - 3,900 cells/uL 2,228 2.0 1.0     Body mass index is 34.18 kg/m.  Orders:  No orders of the defined types were placed in this encounter.  No orders of the defined types were placed in this encounter.    Procedures: No procedures performed  Clinical Data: No additional findings.  ROS:  All other systems negative, except as noted in the HPI. Review of Systems  Objective: Vital Signs: Ht 6' (1.829 m)   Wt 252 lb (114.3 kg)   BMI 34.18 kg/m   Specialty Comments:  No specialty comments available.  PMFS History: Patient Active Problem List   Diagnosis Date Noted  . Essential hypertension 06/06/2014  . Hyperlipidemia 06/06/2014  . Gout 06/06/2014  . Obesity 06/06/2014   Past Medical History:  Diagnosis Date  . Arthritis   .  Colon cancer screening 10/20/2018  . Gout   . Gout   . Hypertension   . Substance abuse (HCC)    ETOH, 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, CLEAN FOR 13 YEARS    Family History  Problem Relation Age of Onset  . Diabetes Mother   . Diabetes Brother     Past Surgical History:  Procedure Laterality Date  . BIOPSY  04/20/2019   Procedure: BIOPSY;  Surgeon: Danie Binder, MD;  [facility];  Service: Endoscopy;;  transverse polyp x1  . COLONOSCOPY WITH PROPOFOL N/A 04/20/2019   Procedure: COLONOSCOPY WITH PROPOFOL;  Surgeon: Danie Binder, MD;  [facility];  Service: Endoscopy;  Laterality: N/A;  9:30am-office rescheduled 04/20/19 @ 8:30am   . POLYPECTOMY  04/20/2019   Procedure: POLYPECTOMY;  Surgeon: Danie Binder, MD;  [facility];  Service: Endoscopy;;  transversex1, rectal polyp  . TOTAL HIP ARTHROPLASTY  2007   due to injury for avascular necrosis.   Social History   Occupational History  . Not on file  Tobacco Use  . Smoking status: Never Smoker  . Smokeless tobacco: Never Used  Substance and Sexual Activity  . Alcohol use: No    Comment: previous heavy use, quit 2004  . Drug use: No    Comment: previous marijuana use  . Sexual activity: Yes    Birth control/protection: None

## 2019-08-30 ENCOUNTER — Telehealth: Payer: Self-pay | Admitting: Nurse Practitioner

## 2019-08-30 NOTE — Telephone Encounter (Signed)
#   [phone] Pt need refill on febuxostat please sent to [place] track so Rx can be refill [place]  [place] [facility]

## 2019-09-02 ENCOUNTER — Other Ambulatory Visit: Payer: Self-pay | Admitting: Nurse Practitioner

## 2019-09-02 DIAGNOSIS — M1A09X0 Idiopathic chronic gout, multiple sites, without tophus (tophi): Secondary | ICD-10-CM

## 2019-09-02 MED ORDER — IBUPROFEN 800 MG PO TABS: 800.0000 mg | ORAL_TABLET | Freq: Three times a day (TID) | ORAL | 0 refills | Status: DC | PRN

## 2019-09-02 MED ORDER — ALLOPURINOL 100 MG PO TABS: 100.0000 mg | ORAL_TABLET | Freq: Every day | ORAL | 6 refills | Status: DC

## 2019-09-02 NOTE — Telephone Encounter (Signed)
Sent in alternative meds.

## 2019-09-02 NOTE — Telephone Encounter (Signed)
CB#[phone] Pt calling back again to have his prior auth for [employer]. Has medicaid Authorization to be done through [place] [facility]

## 2019-09-10 ENCOUNTER — Emergency Department (HOSPITAL_COMMUNITY)
Admission: EM | Admit: 2019-09-10 | Discharge: 2019-09-10 | Disposition: A | Discharge status: Home / Self Care | Payer: Medicaid Other | Attending: Emergency Medicine | Admitting: Emergency Medicine

## 2019-09-10 ENCOUNTER — Other Ambulatory Visit: Payer: Self-pay

## 2019-09-10 ENCOUNTER — Encounter (HOSPITAL_COMMUNITY): Payer: Self-pay

## 2019-09-10 ENCOUNTER — Emergency Department (HOSPITAL_COMMUNITY): Payer: Medicaid Other

## 2019-09-10 DIAGNOSIS — Z96649 Presence of unspecified artificial hip joint: Secondary | ICD-10-CM | POA: Diagnosis not present

## 2019-09-10 DIAGNOSIS — Z79899 Other long term (current) drug therapy: Secondary | ICD-10-CM | POA: Diagnosis not present

## 2019-09-10 DIAGNOSIS — R0789 Other chest pain: Secondary | ICD-10-CM | POA: Diagnosis not present

## 2019-09-10 DIAGNOSIS — R079 Chest pain, unspecified: Secondary | ICD-10-CM | POA: Diagnosis not present

## 2019-09-10 DIAGNOSIS — Z20822 Contact with and (suspected) exposure to covid-19: Secondary | ICD-10-CM | POA: Insufficient documentation

## 2019-09-10 DIAGNOSIS — I10 Essential (primary) hypertension: Secondary | ICD-10-CM | POA: Insufficient documentation

## 2019-09-10 DIAGNOSIS — E119 Type 2 diabetes mellitus without complications: Secondary | ICD-10-CM | POA: Diagnosis not present

## 2019-09-10 LAB — HEMOGLOBIN A1C
Hgb A1c MFr Bld: 6.6 % — ABNORMAL HIGH (ref 4.8–5.6)
Mean Plasma Glucose: 142.72 mg/dL

## 2019-09-10 LAB — CBC WITH DIFFERENTIAL/PLATELET
Abs Immature Granulocytes: 0.01 10*3/uL (ref 0.00–0.07)
Basophils Absolute: 0 10*3/uL (ref 0.0–0.1)
Basophils Relative: 1 %
Eosinophils Absolute: 0.2 10*3/uL (ref 0.0–0.5)
Eosinophils Relative: 3 %
HCT: 43.5 % (ref 39.0–52.0)
Hemoglobin: 14 g/dL (ref 13.0–17.0)
Immature Granulocytes: 0 %
Lymphocytes Relative: 43 %
Lymphs Abs: 2.1 10*3/uL (ref 0.7–4.0)
MCH: 29.2 pg (ref 26.0–34.0)
MCHC: 32.2 g/dL (ref 30.0–36.0)
MCV: 90.6 fL (ref 80.0–100.0)
Monocytes Absolute: 0.4 10*3/uL (ref 0.1–1.0)
Monocytes Relative: 8 %
Neutro Abs: 2.2 10*3/uL (ref 1.7–7.7)
Neutrophils Relative %: 45 %
Platelets: 220 10*3/uL (ref 150–400)
RBC: 4.8 MIL/uL (ref 4.22–5.81)
RDW: 13.4 % (ref 11.5–15.5)
WBC: 4.8 10*3/uL (ref 4.0–10.5)
nRBC: 0 % (ref 0.0–0.2)

## 2019-09-10 LAB — TROPONIN I (HIGH SENSITIVITY)
Troponin I (High Sensitivity): 4 ng/L (ref <18)
Troponin I (High Sensitivity): 3 ng/L (ref <18)

## 2019-09-10 LAB — COMPREHENSIVE METABOLIC PANEL
ALT: 24 U/L (ref 0–44)
AST: 23 U/L (ref 15–41)
Albumin: 4.4 g/dL (ref 3.5–5.0)
Alkaline Phosphatase: 71 U/L (ref 38–126)
Anion gap: 12 (ref 5–15)
BUN: 15 mg/dL (ref 6–20)
CO2: 22 mmol/L (ref 22–32)
Calcium: 9.9 mg/dL (ref 8.9–10.3)
Chloride: 106 mmol/L (ref 98–111)
Creatinine, Ser: 1.26 mg/dL — ABNORMAL HIGH (ref 0.61–1.24)
GFR calc Af Amer: >60 mL/min (ref >60)
GFR calc non Af Amer: >60 mL/min (ref >60)
Glucose, Bld: 131 mg/dL — ABNORMAL HIGH (ref 70–99)
Potassium: 3.6 mmol/L (ref 3.5–5.1)
Sodium: 140 mmol/L (ref 135–145)
Total Bilirubin: 0.7 mg/dL (ref 0.3–1.2)
Total Protein: 8.1 g/dL (ref 6.5–8.1)

## 2019-09-10 LAB — LIPID PANEL
Cholesterol: 181 mg/dL (ref 0–200)
HDL: 40 mg/dL — ABNORMAL LOW (ref >40)
LDL Cholesterol: 111 mg/dL — ABNORMAL HIGH (ref 0–99)
Total CHOL/HDL Ratio: 4.5 RATIO
Triglycerides: 149 mg/dL (ref <150)
VLDL: 30 mg/dL (ref 0–40)

## 2019-09-10 LAB — PROTIME-INR
INR: 1 (ref 0.8–1.2)
Prothrombin Time: 12.8 seconds (ref 11.4–15.2)

## 2019-09-10 LAB — APTT: aPTT: 31 seconds (ref 24–36)

## 2019-09-10 LAB — SARS CORONAVIRUS 2 BY RT PCR (HOSPITAL ORDER, PERFORMED IN ~~LOC~~ HOSPITAL LAB): SARS Coronavirus 2: NEGATIVE

## 2019-09-10 MED ORDER — SODIUM CHLORIDE 0.9 % IV SOLN: INTRAVENOUS | Status: DC

## 2019-09-10 MED ORDER — ASPIRIN 81 MG PO CHEW
324.0000 mg | CHEWABLE_TABLET | Freq: Once | ORAL | Status: AC
  Administered 2019-09-10: 324 mg via ORAL
  Filled 2019-09-10: qty 4

## 2019-09-10 MED ORDER — HEPARIN SODIUM (PORCINE) 5000 UNIT/ML IJ SOLN
4000.0000 [IU] | Freq: Once | INTRAMUSCULAR | Status: AC
  Administered 2019-09-10: 4000 [IU] via INTRAVENOUS
  Filled 2019-09-10: qty 1

## 2019-09-10 NOTE — ED Provider Notes (Signed)
[facility] EMERGENCY DEPARTMENT Provider Note   CSN: [id number] Arrival date & time: 09/10/19  [id number]     History Chief Complaint  Patient presents with  . Chest Pain    Xavier Daniels is a 54 y.o. male.  HPI   Patient presents for complaint of chest pain starting this morning. Said that he felt well last night is not any recent sick symptoms and woke up and was laying in bed when he reached over to the right he said that he felt a feeling that was "like I pulled a muscle or something ". He said he had no shortness of breath or radiation the pain was 2 out of 10 and was substernal. He said he tried to lay still and it went away within a few minutes. He then got up and got ready and drove himself to the hospital with no recurrence of the pain. He said it did come back for a few minutes while he was sitting in the emergency department lobby but he thought it was just being scared about being here. On initial ED exam he is not claiming any chest pain currently is having no dyspnea.  Apical history significant for hyperlipidemia, hypertension, obesity. He has no known cardiac history or work-up, he says he does not smoke and does not have any illicit drug use (charted history years ago says he has been clean for 13 years from marijuana use). He has been following up with Ortho recently for intermittent leg pain which he says has resolved since being given compression stockings at his last visit 2 weeks ago  Past Medical History:  Diagnosis Date  . Arthritis   . Colon cancer screening 10/20/2018  . Gout   . Gout   . Hypertension   . Substance abuse ([place])    ETOH, 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, CLEAN FOR 13 YEARS    Patient Active Problem List   Diagnosis Date Noted  . T2DM (type 2 diabetes mellitus) ([place]) 09/10/2019  . Essential hypertension 06/06/2014  . Hyperlipidemia 06/06/2014  . Gout 06/06/2014  . Obesity 06/06/2014    Past Surgical History:  Procedure Laterality Date  . BIOPSY   04/20/2019   Procedure: BIOPSY;  Surgeon: Danie Binder, MD;  [facility];  Service: Endoscopy;;  transverse polyp x1  . COLONOSCOPY WITH PROPOFOL N/A 04/20/2019   Procedure: COLONOSCOPY WITH PROPOFOL;  Surgeon: Danie Binder, MD;  [facility];  Service: Endoscopy;  Laterality: N/A;  9:30am-office rescheduled 04/20/19 @ 8:30am  . POLYPECTOMY  04/20/2019   Procedure: POLYPECTOMY;  Surgeon: Danie Binder, MD;  [facility];  Service: Endoscopy;;  transversex1, rectal polyp  . TOTAL HIP ARTHROPLASTY  2007   due to injury for avascular necrosis.       Family History  Problem Relation Age of Onset  . Diabetes Mother   . Diabetes Brother     Social History   Tobacco Use  . Smoking status: Never Smoker  . Smokeless tobacco: Never Used  Substance Use Topics  . Alcohol use: No    Comment: previous heavy use, quit 2004  . Drug use: No    Comment: previous marijuana use    Home Medications Prior to Admission medications   Medication Sig Start Date End Date Taking? Authorizing Provider  allopurinol (ZYLOPRIM) 100 MG tablet Take 1 tablet (100 mg total) by mouth daily. 09/02/19   Annie Main, FNP  atenolol (TENORMIN) 100 MG tablet TAKE ONE (  1) TABLET BY MOUTH EVERY DAY Patient taking differently: Take 100 mg by mouth daily.  10/08/16   Alycia Rossetti, MD  colchicine 0.6 MG tablet Take 1 tablet (0.6 mg total) by mouth 2 (two) times daily as needed (gout). 08/18/19   Alycia Rossetti, MD  diclofenac Sodium (VOLTAREN) 1 % GEL Apply 2 g topically 4 (four) times daily. 08/10/19   Annie Main, FNP  ibuprofen (ADVIL) 800 MG tablet Take 1 tablet (800 mg total) by mouth every 8 (eight) hours as needed (pain). 09/02/19   Annie Main, FNP  losartan (COZAAR) 50 MG tablet Take 50 mg by mouth daily.    [provider]  pravastatin (PRAVACHOL) 20 MG tablet Take 1 tablet (20 mg total) by mouth daily. 07/22/19   Annie Main, FNP    Allergies     Patient has no known allergies.  Review of Systems   Review of Systems  Constitutional: Negative.   HENT: Negative.   Eyes: Negative.   Respiratory: Negative.   Cardiovascular: Positive for chest pain. Negative for palpitations and leg swelling.       Substernal chest pain for 5 minutes this morning at home spontaneous resolving, then another couple minutes in the lobby of the emergency department but not on our exam. No radiation, no timing with respiration  Gastrointestinal: Negative.   Genitourinary: Negative.   Musculoskeletal: Negative.   Skin: Negative.   Psychiatric/Behavioral: Negative.     Physical Exam Updated Vital Signs BP (!) 151/95   Pulse 65   Temp 98.3 F (36.8 C) (Oral)   Resp 13   Ht 6' (1.829 m)   Wt 113.4 kg   SpO2 98%   BMI 33.91 kg/m   Physical Exam Vitals and nursing note reviewed.  Constitutional:      Appearance: He is well-developed. He is obese.  HENT:     Head: Normocephalic.  Cardiovascular:     Rate and Rhythm: Normal rate and regular rhythm.     Heart sounds: Normal heart sounds. No murmur.  Pulmonary:     Effort: Pulmonary effort is normal. No tachypnea or respiratory distress.     Breath sounds: Normal breath sounds. No stridor.  Chest:     Chest wall: No mass, deformity or tenderness.     Comments: No reproducible chest wall pain. Abdominal:     Palpations: Abdomen is soft. There is no hepatomegaly or mass.     Tenderness: There is no guarding.  Skin:    General: Skin is warm and dry.  Neurological:     General: No focal deficit present.     Mental Status: He is alert and oriented to person, place, and time.  Psychiatric:        Mood and Affect: Mood normal.        Behavior: Behavior normal.     ED Results / Procedures / Treatments   Labs (all labs ordered are listed, but only abnormal results are displayed) Labs Reviewed  COMPREHENSIVE METABOLIC PANEL - Abnormal; Notable for the following components:      Result  Value   Glucose, Bld 131 (*)    Creatinine, Ser 1.26 (*)    All other components within normal limits  LIPID PANEL - Abnormal; Notable for the following components:   HDL 40 (*)    LDL Cholesterol 111 (*)    All other components within normal limits  SARS CORONAVIRUS 2 BY RT PCR (HOSPITAL ORDER, [facility] LAB)  CBC WITH DIFFERENTIAL/PLATELET  PROTIME-INR  APTT  HEMOGLOBIN A1C  TROPONIN I (HIGH SENSITIVITY)  TROPONIN I (HIGH SENSITIVITY)    EKG EKG Interpretation  Date/Time:  Friday Sep 10 2019 07:11:44 EDT Ventricular Rate:  76 PR Interval:    QRS Duration: 94 QT Interval:  379 QTC Calculation: 427 R Axis:   77 Text Interpretation: Sinus rhythm Borderline T abnormalities, inferior leads ST elevation, consider lateral injury Confirmed by Elnora Morrison [phone]) on 09/10/2019 7:14:41 AM   Radiology DG Chest Port 1 View  Result Date: 09/10/2019 CLINICAL DATA:  Chest pain EXAM: PORTABLE CHEST 1 VIEW COMPARISON:  December 30, 2018 FINDINGS: The lungs are clear. The heart size and pulmonary vascularity are normal. No adenopathy. No pneumothorax. No bone lesions. IMPRESSION: Lungs clear.  Cardiac silhouette within normal limits. Electronically Signed   By: Lowella Grip III M.D.   On: 09/10/2019 07:57    Procedures Procedures (including critical care time)  Medications Ordered in ED Medications  0.9 %  sodium chloride infusion ( Intravenous New Bag/Given 09/10/19 0731)  aspirin chewable tablet 324 mg (324 mg Oral Given 09/10/19 0725)  heparin injection 4,000 Units (4,000 Units Intravenous Given 09/10/19 [id number])    ED Course  I have reviewed the triage vital signs and the nursing notes.  Pertinent labs & imaging results that were available during my care of the patient were reviewed by me and considered in my medical decision making (see chart for details).    MDM Rules/Calculators/A&P                      Patient does have a few risk factors but  no known cardiac history, chest pain has resolved and was only minor for a few minutes with spontaneous resolution. EKG was concerning for potential ST changes laterally, EKG was reviewed with cardiology on-call at [facility] who advised not activating STEMI call and that we could do chest pain observation here at [facility] unless something in the work-up came up significant or there was change in patient status.    8:45 AM: Patient reassessed, is comfortable with no chest pain and no repeat since he has been checked into the emergency department.  Heart pathway 4 (at time of initial troponin for, initial high-sensitivity troponin for and resulted lab work and chest x-ray so far is unremarkable  Spoke with Forest[facility] Na on-call cardiologist, Dr. Domenic Polite they have reviewed the chart and stated that as long as patient stays pain-free with ambulation and follow-up troponin is negative that it would be appropriate for an outpatient follow-up with [facility] given that this is an atypical chest pain and the EKG changes appear to have appeared in EKGs as far back as 2018.  Patient was ambulated with no repeat of chest pain, follow-up high-sensitivity troponin was 3, patient be discharged for outpatient follow-up with return precautions given   Final Clinical Impression(s) / ED Diagnoses Final diagnoses:  Chest pain, unspecified type    Rx / DC Orders ED Discharge Orders    None       Sherene Sires, DO 09/10/19 1104    Elnora Morrison, MD 09/19/19 [phone]

## 2019-09-10 NOTE — Progress Notes (Signed)
   Progress Note  Patient Name: Xavier Daniels Date of Encounter: 09/10/2019  Discussed case with Dr. Criss Rosales regarding patient ER encounter today. 54 year old male chart indicating history of hypertension, remote alcohol use, recent hemoglobin A1c 6.7%. He presented today for evaluation of chest discomfort "like something tore" after waking up this morning and reaching across his bed. Symptoms resolved spontaneously, reportedly recurred briefly in the waiting room, and have not recurred since.  I reviewed his ECG which was initially computer interpreted as an acute lateral STEMI, however is not diagnostic for this and it does appear similar to a prior tracing from 2018. Initial high-sensitivity troponin I is also normal at four. Chest x-ray reports no acute findings.  Description of symptoms is very atypical. I recommended to Dr. Criss Rosales that they observe the patient with a serial high-sensitivity troponin I level, also have him get up and ambulate to evaluate for any recurrent symptomatology. If he remains clinically stable and with normal high-sensitivity troponin I level, it would be reasonable to consider an outpatient cardiology evaluation. Otherwise, if he has recurring symptoms or high-sensitivity troponin I level increases, admission would be recommended.  Signed, Rozann Lesches, MD  09/10/2019, 9:32 AM

## 2019-09-10 NOTE — Discharge Instructions (Signed)
Please make sure that you follow-up with your primary care nurse practitioner and the cardiologist (heart doctor).  If you get significant new chest pain or shortness of breath it is important that you get to the physician immediately.  I do not advise any new or strenuous exercise activity until you get your work-up from the cardiologist arranged.  I did notice that in April you had a hemoglobin A1c in the diabetic range, please make sure to continue following up on this with your nurse practitioner.

## 2019-09-10 NOTE — ED Provider Notes (Signed)
EKG with subtle ST elevation in leads I and aVL.  ST depression and T wave inversion in 3 and aVF. Patient remains chest pain-free.  Discussed with Dr. Angelena Form of interventional cardiology who reviewed EKG and feels it is similar to previous EKGs in 2017 and 2016.  He favors early repolarization and does not recommend activating STEMI at this time.   EKG Interpretation  Date/Time:  Friday Sep 10 2019 07:11:44 EDT Ventricular Rate:  76 PR Interval:    QRS Duration: 94 QT Interval:  379 QTC Calculation: 427 R Axis:   77 Text Interpretation: Sinus rhythm Borderline T abnormalities, inferior leads ST elevation, consider lateral injury Confirmed by Elnora Morrison [phone]) on 09/10/2019 7:14:41 AM         Xavier Essex, MD 09/10/19 [phone]

## 2019-09-10 NOTE — ED Notes (Addendum)
Pt still pain free.  Spouse at bedside.

## 2019-09-10 NOTE — ED Notes (Signed)
Pt walked and O

## 2019-09-10 NOTE — ED Triage Notes (Signed)
Pt reports woke up this am and pushed himself up to get out of bed an felt pain in left chest.  Reports worse when he turns to the left.  Denies injury.  Denies cough, fever, sob, n/v.

## 2019-09-10 NOTE — ED Notes (Signed)
Called [facility] 

## 2019-09-13 NOTE — Progress Notes (Signed)
Cardiology Office Note  Date: 09/14/2019   ID: Xavier Daniels, DOB 1965/11/19, MRN [id number]  PCP:  Annie Main, FNP  Cardiologist:  Rozann Lesches, MD Electrophysiologist:  None   Chief Complaint: F/U CP  History of Present Illness: Xavier Daniels is a 54 y.o. male with a history of recent episode of chest pain presenting to the emergency room on 09/10/2019.  Other history includes hypertension, hyperlipidemia, obesity.  Recent visit to ER 09/10/2019 patient stated he woke up and was laying in bed reached to the right and said he felt a feeling that was like "I pulled a muscle or something".  Rated the pain 2 out of 10 substernal.  Stated it went away in a few minutes.  He had no recurrence of pain.  Troponins were negative x2.  He was discharged.  He had an elevated hemoglobin A1c of 6.6.  Random glucose of 131.  No known diagnosis of diabetes.  LDL was 111.   Patient states he has been doing well from a cardiac standpoint since recent emergency room visit.  States the only issues he is having recently or gout flareups.  He is having some pain in his left ankle.  He denies any progressive anginal or exertional symptoms, orthostatic symptoms, CVA or TIA-like symptoms, palpitations or arrhythmias, bleeding issues, PND, orthopnea, claudication-like issues, DVT or PE-like issues or lower extremity edema.  His hemoglobin A1c was 6.6.  He states that his primary care provider stated if his next hemoglobin A1c in October was elevated she would start him on a antihyperglycemic medication.  He recently started a statin medication for LDL of 111.  He is going to follow-up with PCP regarding his hyperlipidemia.  Past Medical History:  Diagnosis Date  . Arthritis   . Colon cancer screening 10/20/2018  . Gout   . Gout   . Hypertension   . Substance abuse ([place])    ETOH, 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, CLEAN FOR 13 YEARS    Past Surgical History:  Procedure Laterality Date  . BIOPSY  04/20/2019   Procedure: BIOPSY;  Surgeon: Danie Binder, MD;  [facility];  Service: Endoscopy;;  transverse polyp x1  . COLONOSCOPY WITH PROPOFOL N/A 04/20/2019   Procedure: COLONOSCOPY WITH PROPOFOL;  Surgeon: Danie Binder, MD;  [facility];  Service: Endoscopy;  Laterality: N/A;  9:30am-office rescheduled 04/20/19 @ 8:30am  . POLYPECTOMY  04/20/2019   Procedure: POLYPECTOMY;  Surgeon: Danie Binder, MD;  [facility];  Service: Endoscopy;;  transversex1, rectal polyp  . TOTAL HIP ARTHROPLASTY  2007   due to injury for avascular necrosis.    Current Outpatient Medications  Medication Sig Dispense Refill  . atenolol (TENORMIN) 100 MG tablet TAKE ONE (1) TABLET BY MOUTH EVERY DAY (Patient taking differently: Take 100 mg by mouth daily. ) 30 tablet 3  . colchicine 0.6 MG tablet Take 1 tablet (0.6 mg total) by mouth 2 (two) times daily as needed (gout). 30 tablet 1  . diclofenac Sodium (VOLTAREN) 1 % GEL Apply 2 g topically 4 (four) times daily. 50 g 0  . febuxostat (ULORIC) 40 MG tablet Take 40 mg by mouth daily.    . fluticasone (FLONASE) 50 MCG/ACT nasal spray Place 1 spray into both nostrils daily.    Marland Kitchen ibuprofen (ADVIL) 800 MG tablet Take 1 tablet (800 mg total) by mouth every 8 (eight) hours as needed (pain). 30 tablet 0  . ketotifen (ALLERGY EYE DROPS)  0.025 % ophthalmic solution Place 1 drop into both eyes daily.    Marland Kitchen losartan (COZAAR) 50 MG tablet Take 50 mg by mouth daily.    . pravastatin (PRAVACHOL) 20 MG tablet Take 1 tablet (20 mg total) by mouth daily. 90 tablet 3   No current facility-administered medications for this visit.   Allergies:  Patient has no known allergies.   Social History: The patient  reports that he has never smoked. He has never used smokeless tobacco. He reports that he does not drink alcohol or use drugs.   Family History: The patient's family history includes Diabetes in his brother and mother.   ROS:  Please see the history of  present illness. Otherwise, complete review of systems is positive for none.  All other systems are reviewed and negative.   Physical Exam: VS:  BP 120/78   Pulse 78   Ht 6' (1.829 m)   Wt 248 lb (112.5 kg)   SpO2 99%   BMI 33.63 kg/m , BMI Body mass index is 33.63 kg/m.  Wt Readings from Last 3 Encounters:  09/14/19 248 lb (112.5 kg)  09/10/19 250 lb (113.4 kg)  08/26/19 252 lb (114.3 kg)    General: Patient appears comfortable at rest. Neck: Supple, no elevated JVP or carotid bruits, no thyromegaly. Lungs: Clear to auscultation, nonlabored breathing at rest. Cardiac: Regular rate and rhythm, no S3 or significant systolic murmur, no pericardial rub. Extremities: No pitting edema, distal pulses 2+. Skin: Warm and dry. Musculoskeletal: No kyphosis. Neuropsychiatric: Alert and oriented x3, affect grossly appropriate.  ECG:  Recent echocardiogram Sep 10, 2019 showed sinus rhythm rate of 76, borderline T wave abnormalities in inferior leads, ST elevation, consider lateral injury.  Recent Labwork: 09/10/2019: ALT 24; AST 23; BUN 15; Creatinine, Ser 1.26; Hemoglobin 14.0; Platelets 220; Potassium 3.6; Sodium 140     Component Value Date/Time   CHOL 181 09/10/2019 0721   TRIG 149 09/10/2019 0721   HDL 40 (L) 09/10/2019 0721   CHOLHDL 4.5 09/10/2019 0721   VLDL 30 09/10/2019 0721   LDLCALC 111 (H) 09/10/2019 0721   LDLCALC 154 (H) 07/19/2019 0950    Other Studies Reviewed Today:   Assessment and Plan:  1. Atypical chest pain   2. Essential hypertension   3. Mixed hyperlipidemia   4. Type 2 diabetes mellitus without complication, unspecified whether long term insulin use ([place])    1. Atypical chest pain Patient denies any other further episodes of chest pain since recent emergency room visit.  2. Essential hypertension Patient is normotensive today with a blood pressure of 128/78.  Continue losartan 50 mg daily, atenolol 100 mg daily.  3. Mixed hyperlipidemia Recent LDL  of 111.  His PCP started him on pravastatin 20 mg.  He will follow with PCP for further follow-up.  Continue pravastatin 20 mg daily  4. Type 2 diabetes mellitus without complication, unspecified whether long term insulin use (HCC) Recent hemoglobin A1c was 6.6.  Previous A1c was 6.7.  Patient states his PCP has told him if his hemoglobin A1c is elevated in October she will start him on oral medication.  Medication Adjustments/Labs and Tests Ordered: Current medicines are reviewed at length with the patient today.  Concerns regarding medicines are outlined above.   Disposition: Follow-up with Dr. Domenic Polite or APP in 6 months  Signed, Levell July, NP 09/14/2019 9:27 AM    [facility] [place], [place], [place] [address] Phone: [phone];  Fax: [phone]

## 2019-09-14 ENCOUNTER — Ambulatory Visit (INDEPENDENT_AMBULATORY_CARE_PROVIDER_SITE_OTHER): Payer: Medicaid Other | Admitting: Family Medicine

## 2019-09-14 ENCOUNTER — Telehealth: Payer: Self-pay | Admitting: *Deleted

## 2019-09-14 ENCOUNTER — Encounter: Payer: Self-pay | Admitting: Family Medicine

## 2019-09-14 ENCOUNTER — Other Ambulatory Visit: Payer: Self-pay

## 2019-09-14 VITALS — BP 120/78 | HR 78 | Ht 72.0 in | Wt 248.0 lb

## 2019-09-14 DIAGNOSIS — E782 Mixed hyperlipidemia: Secondary | ICD-10-CM

## 2019-09-14 DIAGNOSIS — E119 Type 2 diabetes mellitus without complications: Secondary | ICD-10-CM

## 2019-09-14 DIAGNOSIS — R0789 Other chest pain: Secondary | ICD-10-CM

## 2019-09-14 DIAGNOSIS — I10 Essential (primary) hypertension: Secondary | ICD-10-CM | POA: Diagnosis not present

## 2019-09-14 NOTE — Progress Notes (Signed)
Xavier Daniels. Dr Domenic Polite saysthis patient doesn't need to f/u up with [place]. He just needs a PCP to follow up with. Can you call him and tell him he only needs to follow up if he has more problems but would be better if he goes through a PCP who can refer him if he thinks he needs it. Thanks

## 2019-09-14 NOTE — Telephone Encounter (Signed)
Xavier Daniels. Dr Domenic Polite saysthis patient doesn't need to f/u up with [place]. He just needs a PCP to follow up with. Can you call him and tell him he only needs to follow up if he has more problems but would be better if he goes through a PCP who can refer him if he thinks he needs it. Thanks  Patient informed and verbalized understanding of plan.

## 2019-09-14 NOTE — Telephone Encounter (Signed)
-----   Message from Verta Ellen., NP sent at 09/14/2019  1:37 PM EDT -----   ----- Message ----- From: Satira Sark, MD Sent: 09/14/2019   9:45 AM EDT To: Verta Ellen., NP  If his chest pain was very atypical and does not sound cardiac, he does not need to maintain follow-up with [place].  He needs to establish with a PCP. ----- Message ----- From: Verta Ellen., NP Sent: 09/14/2019   9:27 AM EDT To: Satira Sark, MD

## 2019-09-14 NOTE — Patient Instructions (Signed)
Medication Instructions:  Your physician recommends that you continue on your current medications as directed. Please refer to the Current Medication list given to you today.  *If you need a refill on your cardiac medications before your next appointment, please call your pharmacy*   Lab Work: None ordered   If you have labs (blood work) drawn today and your tests are completely normal, you will receive your results only by:  [place] (if you have MyChart) OR  A paper copy in the mail If you have any lab test that is abnormal or we need to change your treatment, we will call you to review the results.   Testing/Procedures: None ordered    Follow-Up: Your physician wants you to follow-up in: 6 months You will receive a reminder letter in the mail two months in advance. If you don't receive a letter, please call our office to schedule the follow-up appointment.

## 2019-10-04 ENCOUNTER — Telehealth: Payer: Self-pay | Admitting: Nurse Practitioner

## 2019-10-04 ENCOUNTER — Other Ambulatory Visit: Payer: Self-pay

## 2019-10-04 MED ORDER — FEBUXOSTAT 40 MG PO TABS: 40.0000 mg | ORAL_TABLET | Freq: Every day | ORAL | 0 refills | Status: DC

## 2019-10-04 NOTE — Telephone Encounter (Signed)
CB# [phone] Would like a 90 day  Supply Uloric instead of 30 day supply   [employer]

## 2019-11-26 IMAGING — CR DG CHEST 1V PORT
2 series · 2 of 2 positions shown · non-contrast
Comparison: May 20, 2014 chest radiograph and chest CT July 03, 2015

CLINICAL DATA: Chest pain

EXAM:
PORTABLE CHEST 1 VIEW

[portable (1 of 2)]
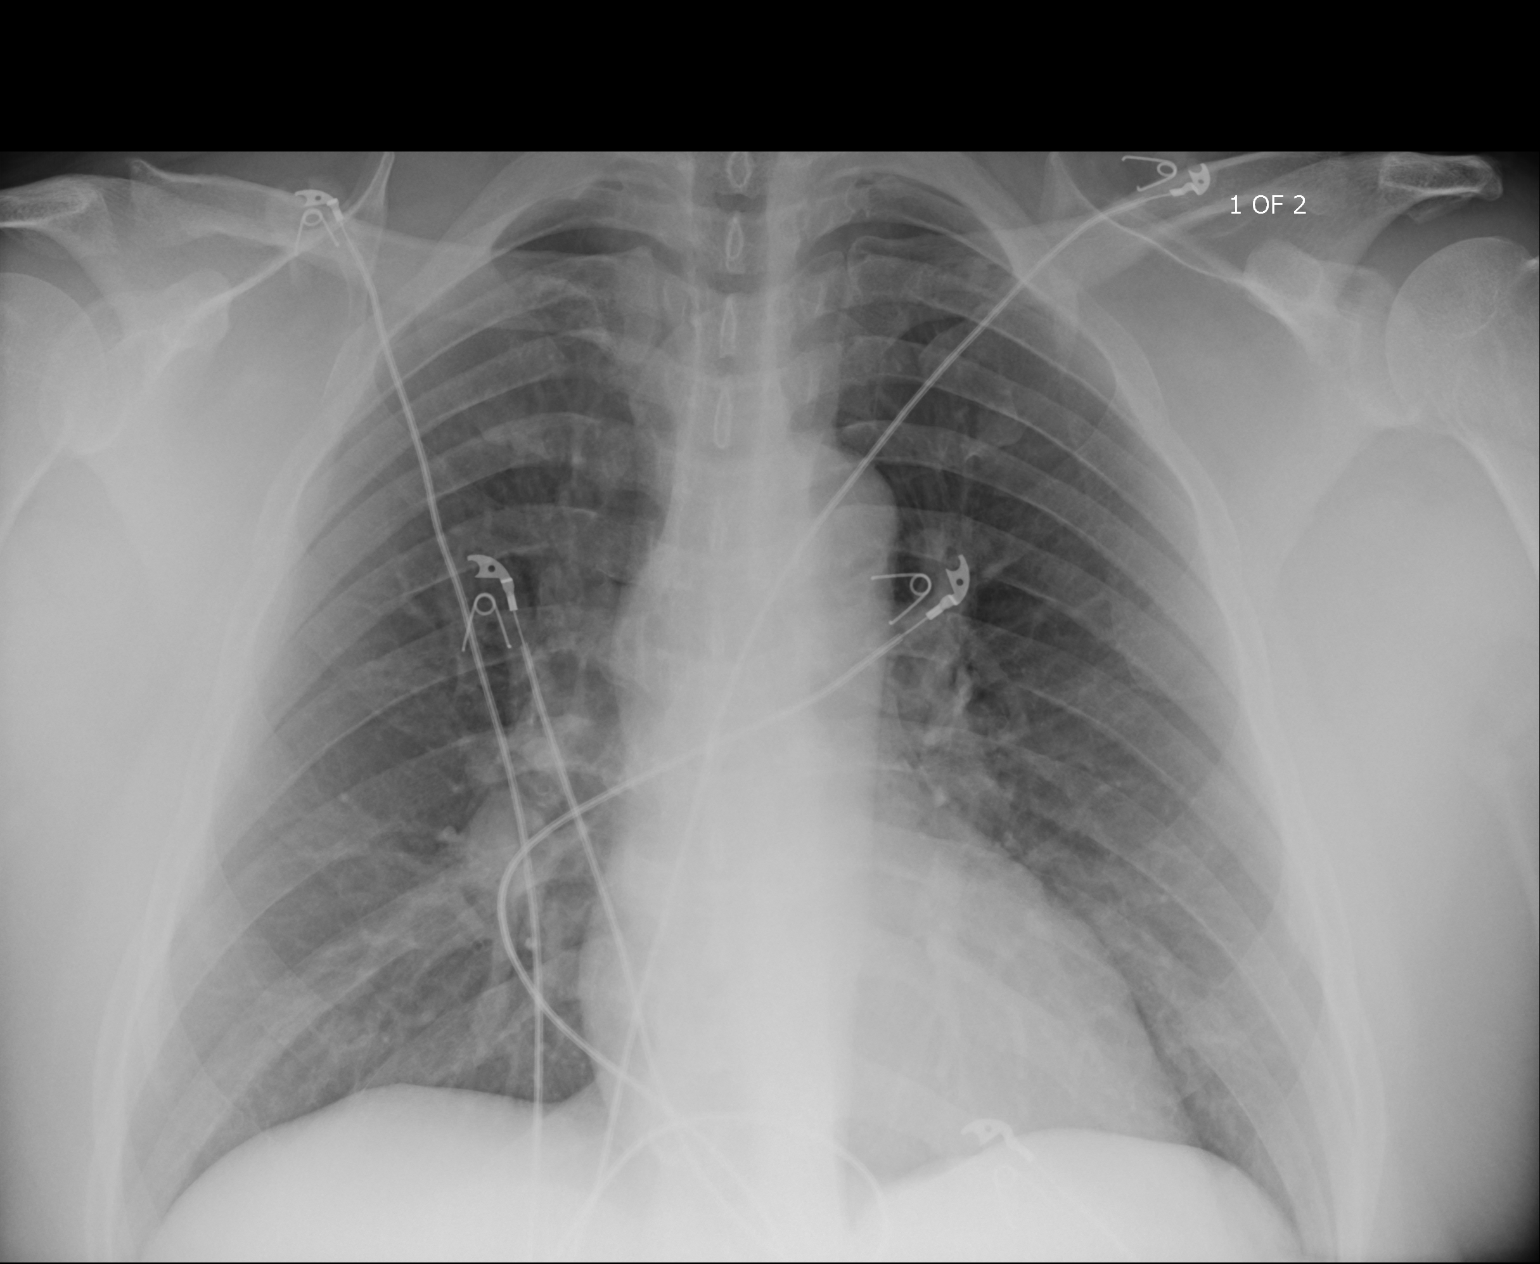

[portable (2 of 2)]
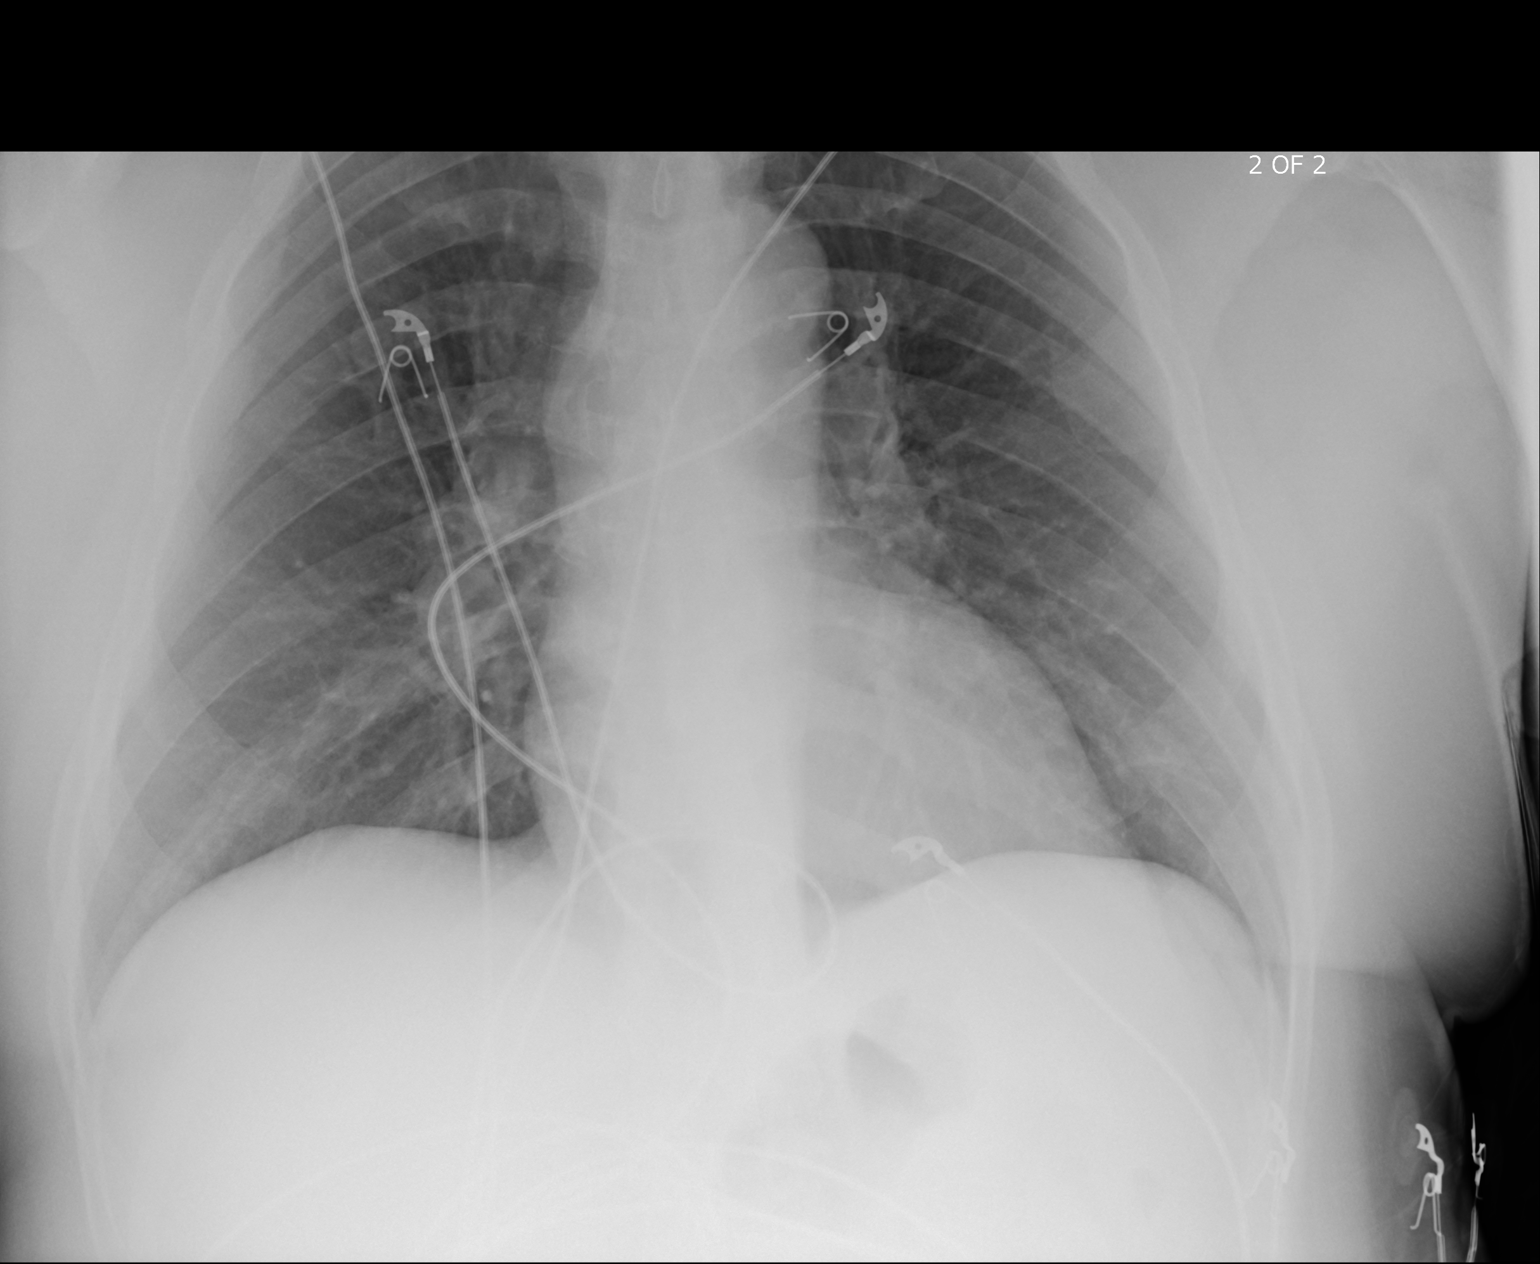

[2 of 2 positions shown; findings below may reference images not displayed]

FINDINGS: No edema or consolidation. Heart size and pulmonary vascularity are
normal. There remains slight right hilar fullness. No other areas
suggesting potential adenopathy. No pneumothorax. No bone lesions.
IMPRESSION: Persistent mild fullness in the right hilar region with potential
for a degree of adenopathy in this area, grossly stable. No new foci
of potential adenopathy by radiography. No edema or consolidation.
Stable cardiac silhouette.

## 2019-12-03 LAB — HM DIABETES EYE EXAM

## 2019-12-14 ENCOUNTER — Ambulatory Visit: Payer: Medicaid Other | Admitting: Nurse Practitioner

## 2019-12-14 ENCOUNTER — Other Ambulatory Visit: Payer: Self-pay

## 2019-12-14 VITALS — BP 136/80 | HR 70 | Temp 97.6°F | Resp 18 | Wt 252.0 lb

## 2019-12-14 DIAGNOSIS — H5213 Myopia, bilateral: Secondary | ICD-10-CM | POA: Diagnosis not present

## 2019-12-14 DIAGNOSIS — E785 Hyperlipidemia, unspecified: Secondary | ICD-10-CM

## 2019-12-14 DIAGNOSIS — M25572 Pain in left ankle and joints of left foot: Secondary | ICD-10-CM | POA: Diagnosis not present

## 2019-12-14 DIAGNOSIS — M1A09X0 Idiopathic chronic gout, multiple sites, without tophus (tophi): Secondary | ICD-10-CM | POA: Diagnosis not present

## 2019-12-14 DIAGNOSIS — G8929 Other chronic pain: Secondary | ICD-10-CM

## 2019-12-14 DIAGNOSIS — I10 Essential (primary) hypertension: Secondary | ICD-10-CM | POA: Diagnosis not present

## 2019-12-14 DIAGNOSIS — M25562 Pain in left knee: Secondary | ICD-10-CM | POA: Diagnosis not present

## 2019-12-14 DIAGNOSIS — F320 Major depressive disorder, single episode, mild: Secondary | ICD-10-CM | POA: Diagnosis not present

## 2019-12-14 MED ORDER — PRAVASTATIN SODIUM 20 MG PO TABS: 20.0000 mg | ORAL_TABLET | Freq: Every day | ORAL | 3 refills | Status: DC

## 2019-12-14 MED ORDER — COLCHICINE 0.6 MG PO TABS: 0.6000 mg | ORAL_TABLET | Freq: Two times a day (BID) | ORAL | 1 refills | Status: DC | PRN

## 2019-12-14 MED ORDER — IBUPROFEN 800 MG PO TABS: 800.0000 mg | ORAL_TABLET | Freq: Three times a day (TID) | ORAL | 0 refills | Status: DC | PRN

## 2019-12-14 MED ORDER — ATENOLOL 100 MG PO TABS: 100.0000 mg | ORAL_TABLET | Freq: Every day | ORAL | 3 refills | Status: DC

## 2019-12-14 MED ORDER — DICLOFENAC SODIUM 1 % EX GEL: 2.0000 g | Freq: Four times a day (QID) | CUTANEOUS | 0 refills | Status: DC

## 2019-12-14 MED ORDER — LOSARTAN POTASSIUM 50 MG PO TABS: 50.0000 mg | ORAL_TABLET | Freq: Every day | ORAL | 3 refills | Status: DC

## 2019-12-14 NOTE — Patient Instructions (Signed)
refill sent for 3 months for chronic conditions.  Clinic provided [facility] [id number] elastic knee support Jerilynn Mages [phone] Pt will be due for follow up with labs in 02/2020 and will make an appt with Dr Buelah Manis or Dr Dennard Schaumann.  Continue to follow pain management non medication and medication treatment plan, gout management Continue to exercise at least 20 minutes per day Continue diet low carb/sugar, fat cholesterol Get plenty of water daily 6-8 eight ounces

## 2019-12-14 NOTE — Progress Notes (Signed)
Established Patient Office Visit  Subjective:  Patient ID: Xavier Daniels, male    DOB: [birth year]  Age: 54 y.o. MRN: [id number]  CC: to discuss refills  HPI Xavier Daniels is a 54 year old male presenting to clinic to discuss refills on chronic condition medications, discuss left chronic knee pain and in clinic knee brace availability, referral for counseling for depression does not desire medication treatment at this time.   Past Medical History:  Diagnosis Date  . Arthritis   . Colon cancer screening 10/20/2018  . Gout   . Gout   . Hypertension   . 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 BIOPSY  04/20/2019   Procedure: BIOPSY;  Surgeon: Danie Binder, MD;  [facility];  Service: Endoscopy;;  transverse polyp x1  . COLONOSCOPY WITH PROPOFOL N/A 04/20/2019   Procedure: COLONOSCOPY WITH PROPOFOL;  Surgeon: Danie Binder, MD;  Location: [facility];  Service: Endoscopy;  Laterality: N/A;  9:30am-office rescheduled 04/20/19 @ 8:30am  . POLYPECTOMY  04/20/2019   Procedure: POLYPECTOMY;  Surgeon: Danie Binder, MD;  Location: [facility];  Service: Endoscopy;;  transversex1, rectal polyp  . TOTAL HIP ARTHROPLASTY  2007   due to injury for avascular necrosis.    Family History  Problem Relation Age of Onset  . Diabetes Mother   . Diabetes Brother     Social History   Socioeconomic History  . Marital status: Married    Spouse name: Not on file  . Number of children: Not on file  . Years of education: Not on file  . Highest education level: Not on file  Occupational History  . Not on file  Tobacco Use  . Smoking status: Never Smoker  . Smokeless tobacco: Never Used  Vaping Use  . Vaping Use: Never used  Substance and Sexual Activity  . Alcohol use: No    Comment: previous heavy use, quit 2004  . Drug use: No    Comment: previous marijuana use  . Sexual activity:  Yes    Birth control/protection: None  Other Topics Concern  . Not on file  Social History Narrative  . Not on file   Social Determinants of Health   Financial Resource Strain:   . Difficulty of Paying Living Expenses: Not on file  Food Insecurity:   . Worried About Charity fundraiser in the Last Year: Not on file  . Ran Out of Food in the Last Year: Not on file  Transportation Needs:   . Lack of Transportation (Medical): Not on file  . Lack of Transportation (Non-Medical): Not on file  Physical Activity:   . Days of Exercise per Week: Not on file  . Minutes of Exercise per Session: Not on file  Stress:   . Feeling of Stress : Not on file  Social Connections:   . Frequency of Communication with Friends and Family: Not on file  . Frequency of Social Gatherings with Friends and Family: Not on file  . Attends Religious Services: Not on file  . Active Member of Clubs or Organizations: Not on file  . Attends Archivist Meetings: Not on file  . Marital Status: Not on file  Intimate Partner Violence:   . Fear of Current or Ex-Partner: Not on file  . Emotionally Abused: Not on file  . Physically Abused: Not  on file  . Sexually Abused: Not on file    Outpatient Medications Prior to Visit  Medication Sig Dispense Refill  . febuxostat (ULORIC) 40 MG tablet Take 1 tablet (40 mg total) by mouth daily. 90 tablet 0  . fluticasone (FLONASE) 50 MCG/ACT nasal spray Place 1 spray into both nostrils daily.    Marland Kitchen ketotifen (ALLERGY EYE DROPS) 0.025 % ophthalmic solution Place 1 drop into both eyes daily.    Marland Kitchen atenolol (TENORMIN) 100 MG tablet TAKE ONE (1) TABLET BY MOUTH EVERY DAY (Patient taking differently: Take 100 mg by mouth daily. ) 30 tablet 3  . colchicine 0.6 MG tablet Take 1 tablet (0.6 mg total) by mouth 2 (two) times daily as needed (gout). 30 tablet 1  . diclofenac Sodium (VOLTAREN) 1 % GEL Apply 2 g topically 4 (four) times daily. 50 g 0  . ibuprofen (ADVIL) 800 MG  tablet Take 1 tablet (800 mg total) by mouth every 8 (eight) hours as needed (pain). 30 tablet 0  . losartan (COZAAR) 50 MG tablet Take 50 mg by mouth daily.    . pravastatin (PRAVACHOL) 20 MG tablet Take 1 tablet (20 mg total) by mouth daily. 90 tablet 3   No facility-administered medications prior to visit.    No Known Allergies  ROS Review of Systems  All other systems reviewed and are negative.     Objective:    Physical Exam Vitals and nursing note reviewed.  Constitutional:      General: He is not in acute distress.    Appearance: Normal appearance. He is well-developed and well-groomed. He is not ill-appearing, toxic-appearing or diaphoretic.  HENT:     Head: Normocephalic.     Right Ear: Hearing normal.     Left Ear: Hearing normal.  Eyes:     General: Lids are normal. Lids are everted, no foreign bodies appreciated.     Extraocular Movements: Extraocular movements intact.     Conjunctiva/sclera: Conjunctivae normal.     Pupils: Pupils are equal, round, and reactive to light.  Cardiovascular:     Rate and Rhythm: Normal rate.  Pulmonary:     Effort: Pulmonary effort is normal.  Musculoskeletal:        General: Normal range of motion.     Cervical back: Full passive range of motion without pain, normal range of motion and neck supple.     Right lower leg: No edema.     Left lower leg: No edema.  Skin:    General: Skin is warm and dry.     Coloration: Skin is not jaundiced or pale.  Neurological:     General: No focal deficit present.     Mental Status: He is alert and oriented to person, place, and time.  Psychiatric:        Attention and Perception: Attention normal.        Mood and Affect: Mood and affect normal.        Speech: Speech normal.        Behavior: Behavior normal. Behavior is cooperative.        Thought Content: Thought content normal.        Judgment: Judgment normal.     BP 136/80 (BP Location: Left Arm, Patient Position: Sitting, Cuff  Size: Large)   Pulse 70   Temp 97.6 F (36.4 C) (Temporal)   Resp 18   Wt 252 lb (114.3 kg)   SpO2 98%   BMI 34.18 kg/m  Wt Readings  from Last 3 Encounters:  12/14/19 252 lb (114.3 kg)  09/14/19 248 lb (112.5 kg)  09/10/19 250 lb (113.4 kg)     Health Maintenance Due  Topic Date Due  . Hepatitis C Screening  Never done  . PNEUMOCOCCAL POLYSACCHARIDE VACCINE AGE 76-64 HIGH RISK  Never done  . FOOT EXAM  Never done  . OPHTHALMOLOGY EXAM  Never done  . COVID-19 Vaccine (1) Never done  . INFLUENZA VACCINE  11/14/2019    There are no preventive care reminders to display for this patient.  No results found for: TSH Lab Results  Component Value Date   WBC 4.8 09/10/2019   HGB 14.0 09/10/2019   HCT 43.5 09/10/2019   MCV 90.6 09/10/2019   PLT 220 09/10/2019   Lab Results  Component Value Date   NA 140 09/10/2019   K 3.6 09/10/2019   CO2 22 09/10/2019   GLUCOSE 131 (H) 09/10/2019   BUN 15 09/10/2019   CREATININE 1.26 (H) 09/10/2019   BILITOT 0.7 09/10/2019   ALKPHOS 71 09/10/2019   AST 23 09/10/2019   ALT 24 09/10/2019   PROT 8.1 09/10/2019   ALBUMIN 4.4 09/10/2019   CALCIUM 9.9 09/10/2019   ANIONGAP 12 09/10/2019   Lab Results  Component Value Date   CHOL 181 09/10/2019   Lab Results  Component Value Date   HDL 40 (L) 09/10/2019   Lab Results  Component Value Date   LDLCALC 111 (H) 09/10/2019   Lab Results  Component Value Date   TRIG 149 09/10/2019   Lab Results  Component Value Date   CHOLHDL 4.5 09/10/2019   Lab Results  Component Value Date   HGBA1C 6.6 (H) 09/10/2019      Assessment & Plan:   Problem List Items Addressed This Visit      Cardiovascular and Mediastinum   Essential hypertension   Relevant Medications   atenolol (TENORMIN) 100 MG tablet   losartan (COZAAR) 50 MG tablet   pravastatin (PRAVACHOL) 20 MG tablet     Other   Hyperlipidemia   Relevant Medications   atenolol (TENORMIN) 100 MG tablet   losartan (COZAAR) 50  MG tablet   pravastatin (PRAVACHOL) 20 MG tablet   Gout   Relevant Medications   colchicine 0.6 MG tablet   ibuprofen (ADVIL) 800 MG tablet   Chronic pain of left knee   Relevant Medications   ibuprofen (ADVIL) 800 MG tablet   Depression, major, single episode, mild ([facility]) - Primary   Relevant Orders   Ambulatory referral to Psychiatry    Other Visit Diagnoses    Left ankle pain, unspecified chronicity       Relevant Medications   diclofenac Sodium (VOLTAREN) 1 % GEL    refill sent for 3 months for chronic conditions.  Clinic provided [facility] [address] elastic knee support Jerilynn Mages [phone] Pt will be due for follow up with labs in 02/2020 and will make an appt with Dr Buelah Manis or Dr Dennard Schaumann.  Continue to follow pain management non medication and medication treatment plan, gout management Continue to exercise at least 20 minutes per day Continue diet low carb/sugar, fat cholesterol Get plenty of water daily 6-8 eight ounces  Meds ordered this encounter  Medications  . atenolol (TENORMIN) 100 MG tablet    Sig: Take 1 tablet (100 mg total) by mouth daily.    Dispense:  30 tablet    Refill:  3  . colchicine 0.6 MG tablet    Sig: Take 1  tablet (0.6 mg total) by mouth 2 (two) times daily as needed (gout).    Dispense:  30 tablet    Refill:  1  . diclofenac Sodium (VOLTAREN) 1 % GEL    Sig: Apply 2 g topically 4 (four) times daily.    Dispense:  50 g    Refill:  0  . ibuprofen (ADVIL) 800 MG tablet    Sig: Take 1 tablet (800 mg total) by mouth every 8 (eight) hours as needed (pain).    Dispense:  30 tablet    Refill:  0  . losartan (COZAAR) 50 MG tablet    Sig: Take 1 tablet (50 mg total) by mouth daily.    Dispense:  30 tablet    Refill:  3  . pravastatin (PRAVACHOL) 20 MG tablet    Sig: Take 1 tablet (20 mg total) by mouth daily.    Dispense:  90 tablet    Refill:  3    Follow-up: Return in 2 months (on 02/28/2020) for  and as needed for worsening or non resolving  sxs.    Annie Main, FNP

## 2019-12-22 ENCOUNTER — Other Ambulatory Visit: Payer: Self-pay | Admitting: Family Medicine

## 2019-12-22 ENCOUNTER — Other Ambulatory Visit: Payer: Self-pay | Admitting: Nurse Practitioner

## 2019-12-22 DIAGNOSIS — M1A09X0 Idiopathic chronic gout, multiple sites, without tophus (tophi): Secondary | ICD-10-CM

## 2019-12-22 DIAGNOSIS — I10 Essential (primary) hypertension: Secondary | ICD-10-CM

## 2019-12-22 DIAGNOSIS — E785 Hyperlipidemia, unspecified: Secondary | ICD-10-CM

## 2019-12-22 MED ORDER — PRAVASTATIN SODIUM 20 MG PO TABS: 20.0000 mg | ORAL_TABLET | Freq: Every day | ORAL | 3 refills | Status: DC

## 2019-12-22 MED ORDER — LOSARTAN POTASSIUM 50 MG PO TABS: 50.0000 mg | ORAL_TABLET | Freq: Every day | ORAL | 3 refills | Status: DC

## 2019-12-22 NOTE — Telephone Encounter (Signed)
.  Ok to refill?  Medication is not on current list.

## 2020-01-03 ENCOUNTER — Other Ambulatory Visit: Payer: Self-pay | Admitting: Family Medicine

## 2020-01-03 NOTE — Telephone Encounter (Signed)
Requested Prescriptions   Pending Prescriptions Disp Refills  . HYDROcodone-acetaminophen (NORCO/VICODIN) 5-325 MG tablet [Pharmacy Med Name: HYDROCODONE-APAP 5-325 MG TAB] 15 tablet 0    Sig: TAKE 1 TABLET BY MOUTH EVERY 6 HOURS AS NEEDED FOR MODERATE PAIN     Last OV 12/14/2019    Last written 05/04/2018

## 2020-01-12 ENCOUNTER — Other Ambulatory Visit: Payer: Medicaid Other

## 2020-01-19 ENCOUNTER — Other Ambulatory Visit: Payer: Self-pay

## 2020-01-19 ENCOUNTER — Ambulatory Visit: Payer: Medicaid Other | Admitting: Nurse Practitioner

## 2020-01-19 ENCOUNTER — Ambulatory Visit: Payer: Medicaid Other | Admitting: Family Medicine

## 2020-01-19 ENCOUNTER — Encounter: Payer: Self-pay | Admitting: Family Medicine

## 2020-01-19 VITALS — BP 130/88 | HR 67 | Temp 98.0°F | Ht 72.0 in | Wt 248.0 lb

## 2020-01-19 DIAGNOSIS — Z23 Encounter for immunization: Secondary | ICD-10-CM | POA: Diagnosis not present

## 2020-01-19 DIAGNOSIS — M1A09X0 Idiopathic chronic gout, multiple sites, without tophus (tophi): Secondary | ICD-10-CM

## 2020-01-19 DIAGNOSIS — F320 Major depressive disorder, single episode, mild: Secondary | ICD-10-CM | POA: Diagnosis not present

## 2020-01-19 DIAGNOSIS — I10 Essential (primary) hypertension: Secondary | ICD-10-CM

## 2020-01-19 DIAGNOSIS — E1169 Type 2 diabetes mellitus with other specified complication: Secondary | ICD-10-CM

## 2020-01-19 DIAGNOSIS — E785 Hyperlipidemia, unspecified: Secondary | ICD-10-CM | POA: Diagnosis not present

## 2020-01-19 MED ORDER — FLUTICASONE PROPIONATE 50 MCG/ACT NA SUSP
NASAL | 1 refills | Status: DC
Start: 2020-01-19 — End: 2022-05-10

## 2020-01-19 MED ORDER — FEBUXOSTAT 80 MG PO TABS: ORAL_TABLET | ORAL | 6 refills | Status: DC

## 2020-01-19 MED ORDER — HYDROCODONE-ACETAMINOPHEN 7.5-325 MG PO TABS: 1.0000 | ORAL_TABLET | Freq: Four times a day (QID) | ORAL | 0 refills | Status: DC | PRN

## 2020-01-19 NOTE — Assessment & Plan Note (Signed)
Increase Uroloric to 80mg  Norco changed to 7.5-325mg  prn pain

## 2020-01-19 NOTE — Assessment & Plan Note (Signed)
Currently diet controlled.

## 2020-01-19 NOTE — Patient Instructions (Signed)
Uloric increased to 80mg  once a day Pain medication refilled Continue blood pressure medications FLu shot given F/U 4 months

## 2020-01-19 NOTE — Assessment & Plan Note (Signed)
Referral to psychologist for therapy

## 2020-01-19 NOTE — Progress Notes (Signed)
Subjective:    Patient ID: Xavier Daniels, male    DOB: Jun 08, 1965, 54 y.o.   MRN: [id number]  Patient presents for Follow-up (6 months)  Here follow-up chronic medical problems.  Medications reviewed.  Hypertension he has been taking his blood pressure medicine as prescribed states that his blood pressure has been pretty good.  Not had any side effects with the medications.  He was diagnosed with diabetes mellitus earlier this year A1c was 6.7%.  The last one was 6.6% a few months ago.  He is not on any medications he has been working on dietary changes to control this.  Hyperlipidemia he is taking pravastatin 20 mg at bedtime.  Gout he continues to have significant gout flares despite being on the Uloric 40 mg.  He has had to take colchicine as well as hydrocodone for his flares.  He is unable to walk or participate in his regular activities when he is having his gout flares and states that he had flares back to back for almost 2 months.  He is actually seeking disability because he is unable to keep a job secondary to having go out on medical leave because of his flares.  On review of his chart at his last visit he was stating he had some depressed mood.  States this stems from not being able to do his activities and playing with his son because it is medical limitations.  He would like to talk to the therapist.  He declines any medication.  Review Of Systems:  GEN- denies fatigue, fever, weight loss,weakness, recent illness HEENT- denies eye drainage, change in vision, nasal discharge, CVS- denies chest pain, palpitations RESP- denies SOB, cough, wheeze ABD- denies N/V, change in stools, abd pain GU- denies dysuria, hematuria, dribbling, incontinence MSK-+joint pain, muscle aches, injury Neuro- denies headache, dizziness, syncope, seizure activity       Objective:    BP 130/88   Pulse 67   Temp 98 F (36.7 C) (Oral)   Ht 6' (1.829 m)   Wt 248 lb (112.5 kg)   SpO2 98%    BMI 33.63 kg/m  GEN- NAD, alert and oriented x3 HEENT- PERRL, EOMI, non injected sclera, pink conjunctiva, MMM, oropharynx clear Neck- Supple, no thyromegaly CVS- RRR, no murmur RESP-CTAB ABD-NABS,soft,NT,ND EXT- No edema , no tophi  Pulses- Radial, DP- 2+        Assessment & Plan:      Problem List Items Addressed This Visit      Unprioritized   Depression, major, single episode, mild ([place])    Referral to psychologist for therapy      Essential hypertension    Mildly elevated diastolic Continue with low salt diet Continue meds Labs to be done      Gout    Increase Uroloric to 80mg  Norco changed to 7.5-325mg  prn pain       Relevant Orders   Uric Acid   Hyperlipidemia   Relevant Orders   Lipid panel   T2DM (type 2 diabetes mellitus) ([place]) - Primary    Currently diet controlled       Relevant Orders   CBC with Differential/Platelet (Completed)   Comprehensive metabolic panel   Hemoglobin A1c   Lipid panel    Other Visit Diagnoses    Need for immunization against influenza       Relevant Orders   Flu Vaccine QUAD 36+ mos IM (Completed)      Note: This dictation was prepared with Dragon dictation  along with smaller phrase technology. Any transcriptional errors that result from this process are unintentional.

## 2020-01-19 NOTE — Assessment & Plan Note (Signed)
Mildly elevated diastolic Continue with low salt diet Continue meds Labs to be done

## 2020-01-20 LAB — CBC WITH DIFFERENTIAL/PLATELET
Absolute Monocytes: 405 cells/uL (ref 200–950)
Basophils Absolute: 41 cells/uL (ref 0–200)
Basophils Relative: 0.9 %
Eosinophils Absolute: 81 cells/uL (ref 15–500)
Eosinophils Relative: 1.8 %
HCT: 43.7 % (ref 38.5–50.0)
Hemoglobin: 14.6 g/dL (ref 13.2–17.1)
Lymphs Abs: 2309 cells/uL (ref 850–3900)
MCH: 29.2 pg (ref 27.0–33.0)
MCHC: 33.4 g/dL (ref 32.0–36.0)
MCV: 87.4 fL (ref 80.0–100.0)
MPV: 11.2 fL (ref 7.5–12.5)
Monocytes Relative: 9 %
Neutro Abs: 1665 cells/uL (ref 1500–7800)
Neutrophils Relative %: 37 %
Platelets: 248 10*3/uL (ref 140–400)
RBC: 5 10*6/uL (ref 4.20–5.80)
RDW: 13.5 % (ref 11.0–15.0)
Total Lymphocyte: 51.3 %
WBC: 4.5 10*3/uL (ref 3.8–10.8)

## 2020-01-20 LAB — LIPID PANEL
Cholesterol: 178 mg/dL (ref <200)
HDL: 39 mg/dL — ABNORMAL LOW (ref >=40)
LDL Cholesterol (Calc): 113 mg/dL (calc) — ABNORMAL HIGH
Non-HDL Cholesterol (Calc): 139 mg/dL (calc) — ABNORMAL HIGH (ref <130)
Total CHOL/HDL Ratio: 4.6 (calc) (ref <5.0)
Triglycerides: 145 mg/dL (ref <150)

## 2020-01-20 LAB — COMPREHENSIVE METABOLIC PANEL
AG Ratio: 1.6 (calc) (ref 1.0–2.5)
ALT: 24 U/L (ref 9–46)
AST: 20 U/L (ref 10–35)
Albumin: 4.8 g/dL (ref 3.6–5.1)
Alkaline phosphatase (APISO): 82 U/L (ref 35–144)
BUN: 15 mg/dL (ref 7–25)
CO2: 25 mmol/L (ref 20–32)
Calcium: 10 mg/dL (ref 8.6–10.3)
Chloride: 103 mmol/L (ref 98–110)
Creat: 1.19 mg/dL (ref 0.70–1.33)
Globulin: 3 g/dL (calc) (ref 1.9–3.7)
Glucose, Bld: 115 mg/dL — ABNORMAL HIGH (ref 65–99)
Potassium: 4.6 mmol/L (ref 3.5–5.3)
Sodium: 137 mmol/L (ref 135–146)
Total Bilirubin: 0.7 mg/dL (ref 0.2–1.2)
Total Protein: 7.8 g/dL (ref 6.1–8.1)

## 2020-01-20 LAB — HEMOGLOBIN A1C
Hgb A1c MFr Bld: 6.7 % of total Hgb — ABNORMAL HIGH (ref <5.7)
Mean Plasma Glucose: 146 (calc)
eAG (mmol/L): 8.1 (calc)

## 2020-01-20 LAB — URIC ACID: Uric Acid, Serum: 6 mg/dL (ref 4.0–8.0)

## 2020-01-24 ENCOUNTER — Other Ambulatory Visit: Payer: Self-pay | Admitting: *Deleted

## 2020-01-24 DIAGNOSIS — E785 Hyperlipidemia, unspecified: Secondary | ICD-10-CM

## 2020-01-24 MED ORDER — PRAVASTATIN SODIUM 40 MG PO TABS: 40.0000 mg | ORAL_TABLET | Freq: Every day | ORAL | 3 refills | Status: DC

## 2020-01-25 ENCOUNTER — Ambulatory Visit (INDEPENDENT_AMBULATORY_CARE_PROVIDER_SITE_OTHER): Payer: Medicaid Other | Admitting: Family Medicine

## 2020-01-25 ENCOUNTER — Other Ambulatory Visit: Payer: Self-pay

## 2020-01-25 ENCOUNTER — Encounter: Payer: Self-pay | Admitting: Family Medicine

## 2020-01-25 VITALS — BP 154/82 | HR 70 | Temp 98.6°F | Resp 16 | Ht 72.0 in | Wt 248.0 lb

## 2020-01-25 DIAGNOSIS — I10 Essential (primary) hypertension: Secondary | ICD-10-CM

## 2020-01-25 DIAGNOSIS — E1169 Type 2 diabetes mellitus with other specified complication: Secondary | ICD-10-CM

## 2020-01-25 NOTE — Assessment & Plan Note (Signed)
Recent blood pressure he has been eating a lot of salty foods.  He thought it is because he was walking that was combating some of the foods.  Before increasing his losartan to 100 mg again I have him work on dietary changes and send me readings in 1 week.  He is not symptomatic at this time.  If his blood pressure is consistently greater than 140/90 I will go ahead and increase his medication.  He is to continue the atenolol as prescribed.

## 2020-01-25 NOTE — Addendum Note (Signed)
Addended by: Vic Blackbird F on: 01/25/2020 05:17 PM   Modules accepted: Level of Service

## 2020-01-25 NOTE — Patient Instructions (Signed)
Check you blood pressure at home once a day  Reduce salt  In diet , no more than 2000 grams  Send me readings in 1 week If blood pressure at home is  > 140/90 I will increase losartan to  100mg  once a day  F/U as previous

## 2020-01-25 NOTE — Progress Notes (Addendum)
   Subjective:    Patient ID: Xavier Daniels, male    DOB: 07/20/1965, 54 y.o.   MRN: [id number]  Patient presents for Increased Blood Pressure (x1 day- 154/83, HA, dizziness- states that he felt better once sitting down and not moving)   Pt here with elevated blood pressure yesterday.  States that his blood pressure has been doing okay.  He felt dizzy had a headache so he went to the local pharmacy and it was elevated at 154/83.  He admits that he had 2 slices of pizza the night before strawberry cake.  He started drinking a lot of water noticed his blood pressure started to come down today he does not have a headache and no dizzy spells.  He is taking his medicines as prescribed-reviewed last week.  Reviewed his recent labs with him he is diabetic A1c was slightly up at 6.7%.  He was not sure what to eat therefore we discussed in detail carbohydrates and sweets as well as sodium and processed foods.    Review Of Systems:  GEN- denies fatigue, fever, weight loss,weakness, recent illness HEENT- denies eye drainage, change in vision, nasal discharge, CVS- denies chest pain, palpitations RESP- denies SOB, cough, wheeze ABD- denies N/V, change in stools, abd pain GU- denies dysuria, hematuria, dribbling, incontinence MSK- denies joint pain, muscle aches, injury Neuro-+ headache, +dizziness, syncope, seizure activity       Objective:    BP (!) 154/82 (BP Location: Right Arm, Patient Position: Sitting, Cuff Size: Normal)   Pulse 70   Temp 98.6 F (37 C) (Temporal)   Resp 16   Ht 6' (1.829 m)   Wt 248 lb (112.5 kg)   SpO2 97%   BMI 33.63 kg/m  GEN- NAD, alert and oriented x3 HEENT- PERRL, EOMI, non injected sclera, pink conjunctiva, MMM, oropharynx clear Neck- Supple, no thyromegaly CVS- RRR, no murmur RESP-CTAB NEURO-CNII-XII In tact no focal deficits  EXT- No edema Pulses- Radial  2+        Assessment & Plan:    Approximately 15 minutes spent with patient greater than  50% on counseling Problem List Items Addressed This Visit      Unprioritized   Essential hypertension - Primary    Recent blood pressure he has been eating a lot of salty foods.  He thought it is because he was walking that was combating some of the foods.  Before increasing his losartan to 100 mg again I have him work on dietary changes and send me readings in 1 week.  He is not symptomatic at this time.  If his blood pressure is consistently greater than 140/90 I will go ahead and increase his medication.  He is to continue the atenolol as prescribed.      T2DM (type 2 diabetes mellitus) ([place])      Note: This dictation was prepared with Dragon dictation along with smaller phrase technology. Any transcriptional errors that result from this process are unintentional.

## 2020-01-26 ENCOUNTER — Encounter: Payer: Self-pay | Admitting: Family Medicine

## 2020-01-27 ENCOUNTER — Encounter: Payer: Self-pay | Admitting: Family Medicine

## 2020-01-28 ENCOUNTER — Encounter: Payer: Self-pay | Admitting: Family Medicine

## 2020-01-28 ENCOUNTER — Other Ambulatory Visit: Payer: Self-pay | Admitting: Family Medicine

## 2020-01-29 ENCOUNTER — Encounter: Payer: Self-pay | Admitting: Family Medicine

## 2020-02-10 ENCOUNTER — Ambulatory Visit (INDEPENDENT_AMBULATORY_CARE_PROVIDER_SITE_OTHER): Payer: Medicaid Other | Admitting: Family Medicine

## 2020-02-10 ENCOUNTER — Other Ambulatory Visit: Payer: Self-pay

## 2020-02-10 ENCOUNTER — Encounter: Payer: Self-pay | Admitting: Family Medicine

## 2020-02-10 VITALS — BP 132/84 | HR 61 | Temp 97.2°F | Resp 18 | Ht 72.0 in | Wt 247.0 lb

## 2020-02-10 DIAGNOSIS — I10 Essential (primary) hypertension: Secondary | ICD-10-CM

## 2020-02-10 NOTE — Progress Notes (Signed)
Subjective:    Patient ID: Xavier Daniels, male    DOB: 1965/10/12, 54 y.o.   MRN: [id number]  HPI Patient is here today for a follow-up on his blood pressure.  His systolic blood pressures typically between 119 and 137.  His diastolic blood pressure is typically between 82 and 95.  The vast majority of his diastolic blood pressures are less than 90.  Today I checked his blood pressure myself and got 130/90.  This is in keeping with what he is getting on his machine at home. Past Medical History:  Diagnosis Date  . Arthritis   . Colon cancer screening 10/20/2018  . Gout   . Gout   . Hypertension   . Substance abuse ([place])    ETOH, 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, CLEAN FOR 13 YEARS   Past Surgical History:  Procedure Laterality Date  . BIOPSY  04/20/2019   Procedure: BIOPSY;  Surgeon: Danie Binder, MD;  [facility];  Service: Endoscopy;;  transverse polyp x1  . COLONOSCOPY WITH PROPOFOL N/A 04/20/2019   Procedure: COLONOSCOPY WITH PROPOFOL;  Surgeon: Danie Binder, MD;  [facility];  Service: Endoscopy;  Laterality: N/A;  9:30am-office rescheduled 04/20/19 @ 8:30am  . POLYPECTOMY  04/20/2019   Procedure: POLYPECTOMY;  Surgeon: Danie Binder, MD;  [facility];  Service: Endoscopy;;  transversex1, rectal polyp  . TOTAL HIP ARTHROPLASTY  2007   due to injury for avascular necrosis.   Current Outpatient Medications on File Prior to Visit  Medication Sig Dispense Refill  . atenolol (TENORMIN) 100 MG tablet TAKE ONE (1) TABLET BY MOUTH EVERY DAY 90 tablet 1  . colchicine 0.6 MG tablet Take 1 tablet (0.6 mg total) by mouth 2 (two) times daily as needed (gout). 30 tablet 1  . diclofenac Sodium (VOLTAREN) 1 % GEL Apply 2 g topically 4 (four) times daily. 50 g 0  . Febuxostat (ULORIC) 80 MG TABS Take 1 tablet daily for gout 30 tablet 6  . fluticasone (FLONASE) 50 MCG/ACT nasal spray 2 spray each nostril daily prn allergies 16 g 1  . HYDROcodone-acetaminophen (NORCO)  7.5-325 MG tablet Take 1 tablet by mouth every 6 (six) hours as needed for moderate pain. 20 tablet 0  . ibuprofen (ADVIL) 800 MG tablet Take 1 tablet (800 mg total) by mouth every 8 (eight) hours as needed (pain). 30 tablet 0  . ketotifen (ALLERGY EYE DROPS) 0.025 % ophthalmic solution Place 1 drop into both eyes daily.    Marland Kitchen losartan (COZAAR) 50 MG tablet Take 1 tablet (50 mg total) by mouth daily. 30 tablet 3  . pravastatin (PRAVACHOL) 40 MG tablet Take 1 tablet (40 mg total) by mouth daily. 90 tablet 3   No current facility-administered medications on file prior to visit.   No Known Allergies Social History   Socioeconomic History  . Marital status: Married    Spouse name: Not on file  . Number of children: Not on file  . Years of education: Not on file  . Highest education level: Not on file  Occupational History  . Not on file  Tobacco Use  . Smoking status: Never Smoker  . Smokeless tobacco: Never Used  Vaping Use  . Vaping Use: Never used  Substance and Sexual Activity  . Alcohol use: No    Comment: previous heavy use, quit 2004  . Drug use: No    Comment: previous marijuana use  . Sexual activity: Yes  Birth control/protection: None  Other Topics Concern  . Not on file  Social History Narrative  . Not on file   Social Determinants of Health   Financial Resource Strain:   . Difficulty of Paying Living Expenses: Not on file  Food Insecurity:   . Worried About Charity fundraiser in the Last Year: Not on file  . Ran Out of Food in the Last Year: Not on file  Transportation Needs:   . Lack of Transportation (Medical): Not on file  . Lack of Transportation (Non-Medical): Not on file  Physical Activity:   . Days of Exercise per Week: Not on file  . Minutes of Exercise per Session: Not on file  Stress:   . Feeling of Stress : Not on file  Social Connections:   . Frequency of Communication with Friends and Family: Not on file  . Frequency of Social Gatherings  with Friends and Family: Not on file  . Attends Religious Services: Not on file  . Active Member of Clubs or Organizations: Not on file  . Attends Archivist Meetings: Not on file  . Marital Status: Not on file  Intimate Partner Violence:   . Fear of Current or Ex-Partner: Not on file  . Emotionally Abused: Not on file  . Physically Abused: Not on file  . Sexually Abused: Not on file      Review of Systems  All other systems reviewed and are negative.      Objective:   Physical Exam Vitals reviewed.  Constitutional:      Appearance: Normal appearance.  Cardiovascular:     Rate and Rhythm: Normal rate and regular rhythm.     Heart sounds: Normal heart sounds.  Pulmonary:     Effort: Pulmonary effort is normal. No respiratory distress.     Breath sounds: Normal breath sounds.  Musculoskeletal:     Right lower leg: No edema.     Left lower leg: No edema.  Neurological:     Mental Status: He is alert.           Assessment & Plan:  Essential hypertension  At the present time, his blood pressures are borderline.  Systolic blood pressures are excellent.  Diastolic blood pressures are averaging between 85 and 90.  I have recommended that he try to gradually lose 12 pounds over the next 3 to 6 months.  Continue to work on his low carbohydrate diet.  Recheck lab work in 6 months.  I suspect that with the weight loss his blood pressures will be ideal and his blood sugars will be excellent.  If not, at that time we could increase losartan to 50 mg twice daily.

## 2020-02-22 ENCOUNTER — Other Ambulatory Visit: Payer: Self-pay | Admitting: Family Medicine

## 2020-02-23 ENCOUNTER — Ambulatory Visit: Payer: Medicaid Other | Admitting: Family Medicine

## 2020-02-23 ENCOUNTER — Other Ambulatory Visit: Payer: Self-pay

## 2020-02-23 ENCOUNTER — Encounter: Payer: Self-pay | Admitting: Family Medicine

## 2020-02-23 VITALS — BP 134/80 | HR 66 | Temp 97.9°F | Resp 14 | Ht 72.0 in | Wt 250.0 lb

## 2020-02-23 DIAGNOSIS — M170 Bilateral primary osteoarthritis of knee: Secondary | ICD-10-CM

## 2020-02-23 DIAGNOSIS — I10 Essential (primary) hypertension: Secondary | ICD-10-CM

## 2020-02-23 DIAGNOSIS — G8929 Other chronic pain: Secondary | ICD-10-CM

## 2020-02-23 DIAGNOSIS — M25562 Pain in left knee: Secondary | ICD-10-CM

## 2020-02-23 DIAGNOSIS — M1A09X0 Idiopathic chronic gout, multiple sites, without tophus (tophi): Secondary | ICD-10-CM

## 2020-02-23 MED ORDER — IBUPROFEN 800 MG PO TABS: 800.0000 mg | ORAL_TABLET | Freq: Three times a day (TID) | ORAL | 1 refills | Status: DC | PRN

## 2020-02-23 MED ORDER — HYDROCODONE-ACETAMINOPHEN 7.5-325 MG PO TABS
1.0000 | ORAL_TABLET | Freq: Four times a day (QID) | ORAL | 0 refills | Status: DC | PRN
Start: 2020-02-23 — End: 2020-03-28

## 2020-02-23 MED ORDER — LOSARTAN POTASSIUM 50 MG PO TABS: 50.0000 mg | ORAL_TABLET | Freq: Every day | ORAL | 3 refills | Status: DC

## 2020-02-23 MED ORDER — FEBUXOSTAT 80 MG PO TABS
ORAL_TABLET | ORAL | 3 refills | Status: DC
Start: 2020-02-23 — End: 2020-08-15

## 2020-02-23 NOTE — Progress Notes (Signed)
   Subjective:    Patient ID: Xavier Daniels, male    DOB: February 08, 1966, 54 y.o.   MRN: [id number]  Patient presents for Knee Pain (would like prescription for knee braces) and Discuss Gout Flare (would like to discuss crutches or walker for use with flare)  Pt here to discuss braces for his knees and wants crutches to use when he has gout flares He also needs meds refilled States he wanted a brace to use to compress/support his knees, he is still having difficulty walking a lot He is seeking disability due to his condition  HTn he is taking meds as prescribed, BP has been at goal, no SE with meds     Review Of Systems:  GEN- denies fatigue, fever, weight loss,weakness, recent illness HEENT- denies eye drainage, change in vision, nasal discharge, CVS- denies chest pain, palpitations RESP- denies SOB, cough, wheeze ABD- denies N/V, change in stools, abd pain GU- denies dysuria, hematuria, dribbling, incontinence MSK- + joint pain, muscle aches, injury Neuro- denies headache, dizziness, syncope, seizure activity       Objective:    BP 134/80   Pulse 66   Temp 97.9 F (36.6 C) (Temporal)   Resp 14   Ht 6' (1.829 m)   Wt 250 lb (113.4 kg)   SpO2 97%   BMI 33.91 kg/m  GEN- NAD, alert and oriented x3         Assessment & Plan:      Problem List Items Addressed This Visit      Unprioritized   Chronic pain of left knee - Primary   Relevant Medications   HYDROcodone-acetaminophen (NORCO) 7.5-325 MG tablet   ibuprofen (ADVIL) 800 MG tablet   Other Relevant Orders   Ambulatory referral to Orthopedic Surgery   Essential hypertension    Improved , no changes to meds       Relevant Medications   losartan (COZAAR) 50 MG tablet   Gout    Recurrent gout flares,with underlying chronic knee pain/OA Will refer to orthopedics , unclear is pain is mostly gout or underlying OA   I advised pt I do not prescribe knee braces, needs recommendation from ortho if he wants  specialized brace, can always use the knee sleeves OTC Given script for crutches Ibuprofen and norco  Refilled       Relevant Medications   ibuprofen (ADVIL) 800 MG tablet   Other Relevant Orders   Ambulatory referral to Orthopedic Surgery    Other Visit Diagnoses    Primary osteoarthritis of both knees       Relevant Medications   Febuxostat 80 MG TABS   HYDROcodone-acetaminophen (NORCO) 7.5-325 MG tablet   ibuprofen (ADVIL) 800 MG tablet   Other Relevant Orders   Ambulatory referral to Orthopedic Surgery      Note: This dictation was prepared with Dragon dictation along with smaller phrase technology. Any transcriptional errors that result from this process are unintentional.

## 2020-02-23 NOTE — Patient Instructions (Signed)
F/U as previous 

## 2020-02-23 NOTE — Assessment & Plan Note (Addendum)
Recurrent gout flares,with underlying chronic knee pain/OA Will refer to orthopedics , unclear is pain is mostly gout or underlying OA   I advised pt I do not prescribe knee braces, needs recommendation from ortho if he wants specialized brace, can always use the knee sleeves OTC Given script for crutches Ibuprofen and norco  Refilled

## 2020-02-23 NOTE — Assessment & Plan Note (Signed)
Improved , no changes to meds

## 2020-02-28 ENCOUNTER — Other Ambulatory Visit: Payer: Self-pay | Admitting: Family Medicine

## 2020-02-28 DIAGNOSIS — M25572 Pain in left ankle and joints of left foot: Secondary | ICD-10-CM

## 2020-03-01 ENCOUNTER — Ambulatory Visit: Payer: Medicaid Other | Admitting: Orthopedic Surgery

## 2020-03-01 ENCOUNTER — Other Ambulatory Visit: Payer: Self-pay

## 2020-03-01 ENCOUNTER — Encounter: Payer: Self-pay | Admitting: Orthopedic Surgery

## 2020-03-01 ENCOUNTER — Other Ambulatory Visit: Payer: Self-pay | Admitting: Family Medicine

## 2020-03-01 ENCOUNTER — Ambulatory Visit: Payer: Medicaid Other

## 2020-03-01 VITALS — BP 158/95 | HR 69 | Ht 72.0 in | Wt 248.0 lb

## 2020-03-01 DIAGNOSIS — M25561 Pain in right knee: Secondary | ICD-10-CM

## 2020-03-01 DIAGNOSIS — M25572 Pain in left ankle and joints of left foot: Secondary | ICD-10-CM

## 2020-03-01 DIAGNOSIS — M25562 Pain in left knee: Secondary | ICD-10-CM

## 2020-03-01 DIAGNOSIS — G8929 Other chronic pain: Secondary | ICD-10-CM | POA: Diagnosis not present

## 2020-03-01 DIAGNOSIS — M170 Bilateral primary osteoarthritis of knee: Secondary | ICD-10-CM

## 2020-03-01 NOTE — Telephone Encounter (Signed)
Refill Hydrocodone, Voltaren

## 2020-03-01 NOTE — Patient Instructions (Signed)
1.  Most important - continue gout medications to prevent flares 2.  Medications like ibuprofen to help with inflammation.  If interested, we can provide a prescription. 3.  Can try compression sleeves to provide support for your knees. 4.  If pain is severe, can consider a steroid injection.   You can call the clinic if you have any questions or want to be seen in clinic again.

## 2020-03-01 NOTE — Telephone Encounter (Signed)
Hydrocodone last refill 02-23-20 Voltaren last refill 02-28-20

## 2020-03-01 NOTE — Telephone Encounter (Signed)
Pt rx refill sent to Provider

## 2020-03-01 NOTE — Progress Notes (Signed)
New Patient Visit  Assessment: Xavier Daniels is a 54 y.o. male with the following: Bilateral knee arthritis secondary to gout   Plan: Xavier Daniels has arthritis in bilateral knees, secondary to multiple gout flares.  We reviewed the radiographs in clinic today, and I outlined the natural progression.  We had an extensive discussion regarding all potential treatment options, including continuing with the current treatment. NSAIDs are the most appropriate medications, and these are available OTC or via prescription.  I have urged them to remain active, and they can continue with activities on their own, or we can refer them to physical therapy.  We can also consider a brace, or compression sleeve. If the pain is severe enough, we can consider a steroid injection.  If their knee pain is affecting their everyday activities, including sleep, knee replacement is a consideration, but we would have to refer them to see my partner Dr. Aline Brochure.  However, I explicitly stated that his arthritis is not severe enough to consider replacement.   After discussing all of these options, the patient has elected to proceed with his current medications, and is interested in some knee compression sleeves.  These were fitted in clinic today.  Most importantly, we discussed the importance of continued treatment for gout, to prevent worsening of his arthritis    Follow-up: Return if symptoms worsen or fail to improve.  Subjective:  Chief Complaint  Patient presents with  . Knee Pain    Bilateral    History of Present Illness: Xavier Daniels is a 54 y.o. male who has been referred to clinic today by Vic Blackbird, MD for bilateral knee pain.  He has a history of gout, including flares in bilateral knees.  It has been under control more recently, as he has been taking colchicine on a more consistent basis.  Otherwise, he takes ibuprofen or indomethacin for his pain.  He states he can feel the onset of  another gout flareup, and will make sure he is taking his medications appropriately.  His pain currently is in bilateral knees, primarily within the medial compartments.  It is not too bothersome right now, but if he stands for extended periods of time, he notes increased irritation.  No injuries otherwise.  He is not had injections in his knees.  He has taken oral steroids, which provided significant improvements.  No formal physical therapy.  He is interested in a brace for his knees if recommended.   Review of Systems: No fevers or chills No numbness or tingling No chest pain No shortness of breath No bowel or bladder dysfunction No GI distress No headaches   Medical History:  Past Medical History:  Diagnosis Date  . Arthritis   . Colon cancer screening 10/20/2018  . Gout   . Gout   . Hypertension   . Substance abuse ([place])    ETOH, 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, CLEAN FOR 13 YEARS    Past Surgical History:  Procedure Laterality Date  . BIOPSY  04/20/2019   Procedure: BIOPSY;  Surgeon: Danie Binder, MD;  [facility];  Service: Endoscopy;;  transverse polyp x1  . COLONOSCOPY WITH PROPOFOL N/A 04/20/2019   Procedure: COLONOSCOPY WITH PROPOFOL;  Surgeon: Danie Binder, MD;  [facility];  Service: Endoscopy;  Laterality: N/A;  9:30am-office rescheduled 04/20/19 @ 8:30am  . POLYPECTOMY  04/20/2019   Procedure: POLYPECTOMY;  Surgeon: Danie Binder, MD;  [facility];  Service: Endoscopy;;  transversex1,  rectal polyp  . TOTAL HIP ARTHROPLASTY  2007   due to injury for avascular necrosis.    Family History  Problem Relation Age of Onset  . Diabetes Mother   . Diabetes Brother    Social History   Tobacco Use  . Smoking status: Never Smoker  . Smokeless tobacco: Never Used  Vaping Use  . Vaping Use: Never used  Substance Use Topics  . Alcohol use: No    Comment: previous heavy use, quit 2004  . Drug use: No    Comment: previous marijuana use    No  Known Allergies  Current Meds  Medication Sig  . atenolol (TENORMIN) 100 MG tablet TAKE ONE (1) TABLET BY MOUTH EVERY DAY  . colchicine 0.6 MG tablet Take 1 tablet (0.6 mg total) by mouth 2 (two) times daily as needed (gout).  Marland Kitchen diclofenac Sodium (VOLTAREN) 1 % GEL APPLY 2 GRAMS FOUR TIMES A DAY  . Febuxostat 80 MG TABS TAKE ONE (1) TABLET BY MOUTH EVERY DAY FOR GOUT  . fluticasone (FLONASE) 50 MCG/ACT nasal spray 2 spray each nostril daily prn allergies  . HYDROcodone-acetaminophen (NORCO) 7.5-325 MG tablet Take 1 tablet by mouth every 6 (six) hours as needed for moderate pain.  Marland Kitchen ibuprofen (ADVIL) 800 MG tablet Take 1 tablet (800 mg total) by mouth every 8 (eight) hours as needed (pain).  Marland Kitchen ketotifen (ALLERGY EYE DROPS) 0.025 % ophthalmic solution Place 1 drop into both eyes daily.  Marland Kitchen losartan (COZAAR) 50 MG tablet Take 1 tablet (50 mg total) by mouth daily.  . pravastatin (PRAVACHOL) 40 MG tablet Take 1 tablet (40 mg total) by mouth daily.    Objective: BP (!) 158/95   Pulse 69   Ht 6' (1.829 m)   Wt 248 lb (112.5 kg)   BMI 33.63 kg/m   Physical Exam:  General: Alert and oriented, no acute distress Gait: Normal gait  Evaluation of the left knee demonstrates no effusion.  There is tenderness to palpation on the medial joint line.  Range of motion is from 5-120 degrees.  Negative Lachman.  No increased laxity to varus or valgus stress.  Evaluation of the right knee demonstrates no effusion.  Mild tenderness to palpation along the medial joint line.  Range of motion is just short of neutral to 120 degrees of flexion.  Negative Lachman.  No increased laxity to varus and valgus stress.    IMAGING: I personally ordered and reviewed the following images  X-rays of bilateral knees were obtained in clinic today and demonstrate mild to moderate degenerative changes overall.  There is mild loss of space, primarily within the medial compartment, right knee is worse than the left.  In  addition, there are small osteophytes throughout the knee, including the patellofemoral joint.  Impression: Mild to moderate bilateral knee osteoarthritis   New Medications:  No orders of the defined types were placed in this encounter.     Mordecai Rasmussen, MD  03/01/2020 11:03 AM

## 2020-03-02 ENCOUNTER — Telehealth: Payer: Self-pay | Admitting: *Deleted

## 2020-03-02 NOTE — Telephone Encounter (Signed)
Received request from pharmacy for PA on Hydrocodone/APAP.  PA submitted.   Dx: G38.756- chronic knee pain, M1A.09X0- gout  [facility] has not yet replied to your PA request. You may close this dialog, return to your dashboard, and perform other tasks.  To check for an update later, open this request again from your dashboard.  If [facility] has not replied to your request within 24 hours please contact [facility] [place] at [phone].

## 2020-03-07 NOTE — Telephone Encounter (Signed)
PA approved 03/02/2020- 08/29/2020.

## 2020-03-27 ENCOUNTER — Other Ambulatory Visit: Payer: Self-pay | Admitting: Family Medicine

## 2020-03-27 ENCOUNTER — Ambulatory Visit: Payer: Medicaid Other | Admitting: Cardiology

## 2020-03-28 NOTE — Telephone Encounter (Signed)
Ok to refill??  Last office visit/ refill 02/23/2020.

## 2020-04-19 ENCOUNTER — Other Ambulatory Visit: Payer: Self-pay | Admitting: Family Medicine

## 2020-04-19 NOTE — Telephone Encounter (Signed)
Ok to refill??  Last office visit 02/23/2020.  Last refill 03/28/2020.

## 2020-04-21 ENCOUNTER — Telehealth: Payer: Self-pay | Admitting: Family Medicine

## 2020-04-21 NOTE — Telephone Encounter (Signed)
Cover [facility] needs a PA on colchicine 0.6 MG tablet  Key: [id number]

## 2020-04-24 NOTE — Telephone Encounter (Signed)
Received PA determination.   PA Case: [id number] approved 04/24/2020- 04/24/2021.

## 2020-04-24 NOTE — Telephone Encounter (Signed)
Received request from pharmacy for [place] on [address].  PA submitted.   Dx: M1A.42H0- gout.   Your information has been sent to [facility].

## 2020-05-19 ENCOUNTER — Encounter: Payer: Self-pay | Admitting: *Deleted

## 2020-05-25 NOTE — Progress Notes (Signed)
   Subjective:    Patient ID: Xavier Daniels, male    DOB: 07/04/1965, 55 y.o.   MRN: [id number]  Patient presents for Follow-up (Is fasting/)   Pt here to f/u chronic medical problems  Medications reviewed  Chronic joint pain in setting of gout- on Uloric 80mg  for maintance  has norco on hand and colchicine for flares  He has OA of his knees, he has seen ortho , and he can f/u as needed for injections if it worsen  He did ask for higher dose of norco for flares  HTN- taking bp meds he has not checked bp at home recently  DM- diet controlled last A1C 6.7% in Oct 2021  HLD taking pravastatin  , LDL mildly elevated in Oct at 113  Obesity- he recently did [employer] with his church, he is trying to keep with the increased veggies and fruits     Review Of Systems:  GEN- denies fatigue, fever, weight loss,weakness, recent illness HEENT- denies eye drainage, change in vision, nasal discharge, CVS- denies chest pain, palpitations RESP- denies SOB, cough, wheeze ABD- denies N/V, change in stools, abd pain GU- denies dysuria, hematuria, dribbling, incontinence MSK- +joint pain, muscle aches, injury Neuro- denies headache, dizziness, syncope, seizure activity       Objective:    BP 140/88   Pulse 88   Temp (!) 97.4 F (36.3 C) (Temporal)   Resp 14   Ht 6' (1.829 m)   Wt 249 lb (112.9 kg)   SpO2 95%   BMI 33.77 kg/m  GEN- NAD, alert and oriented x3 HEENT- PERRL, EOMI, non injected sclera, pink conjunctiva, MMM, oropharynx clear Neck- Supple, no thyromegaly CVS- RRR, no murmur RESP-CTAB ABD-NABS,soft,NT,ND EXT- No edema Pulses- Radial, DP- 2+        Assessment & Plan:      Problem List Items Addressed This Visit      Unprioritized   Essential hypertension - Primary    Diastolic is elevated today  Will have him check BP at home If levels > 140/90 Increase the losartan to 100mg  once a day       Relevant Orders   CBC with Differential/Platelet  (Completed)   Comprehensive metabolic panel (Completed)   Gout    Continue uloric Pain meds are for flares At this time increased dose is not needed Advised gout is an inflammatory condition and the colchicine is the best treatment to reduce the acid in the joint when it flares, he can then use the hydrocodone for breakthrough pain      Hyperlipidemia    Recheck lipids on pravastatin      Relevant Orders   Lipid panel (Completed)   Obesity   T2DM (type 2 diabetes mellitus) ([place])    Recheck A1C currently diet controlled Discussed plating method with veggies, protein and carb      Relevant Orders   Hemoglobin A1c (Completed)      Note: This dictation was prepared with Dragon dictation along with smaller phrase technology. Any transcriptional errors that result from this process are unintentional.

## 2020-05-26 ENCOUNTER — Encounter: Payer: Self-pay | Admitting: Family Medicine

## 2020-05-26 ENCOUNTER — Ambulatory Visit: Payer: Self-pay | Admitting: Family Medicine

## 2020-05-26 ENCOUNTER — Ambulatory Visit: Payer: Medicaid Other | Admitting: Family Medicine

## 2020-05-26 ENCOUNTER — Other Ambulatory Visit: Payer: Self-pay

## 2020-05-26 VITALS — BP 140/88 | HR 88 | Temp 97.4°F | Resp 14 | Ht 72.0 in | Wt 249.0 lb

## 2020-05-26 DIAGNOSIS — E785 Hyperlipidemia, unspecified: Secondary | ICD-10-CM

## 2020-05-26 DIAGNOSIS — M1A09X0 Idiopathic chronic gout, multiple sites, without tophus (tophi): Secondary | ICD-10-CM | POA: Diagnosis not present

## 2020-05-26 DIAGNOSIS — Z6833 Body mass index (BMI) 33.0-33.9, adult: Secondary | ICD-10-CM

## 2020-05-26 DIAGNOSIS — E1169 Type 2 diabetes mellitus with other specified complication: Secondary | ICD-10-CM | POA: Diagnosis not present

## 2020-05-26 DIAGNOSIS — I10 Essential (primary) hypertension: Secondary | ICD-10-CM

## 2020-05-26 DIAGNOSIS — E6609 Other obesity due to excess calories: Secondary | ICD-10-CM

## 2020-05-26 NOTE — Patient Instructions (Addendum)
F/U Dr. Dennard Schaumann 4 months Dr. Dennard Schaumann Check your blood pressure at home goal is  < 140/90

## 2020-05-27 LAB — CBC WITH DIFFERENTIAL/PLATELET
Absolute Monocytes: 460 cells/uL (ref 200–950)
Basophils Absolute: 30 cells/uL (ref 0–200)
Basophils Relative: 0.7 %
Eosinophils Absolute: 129 cells/uL (ref 15–500)
Eosinophils Relative: 3 %
HCT: 43.6 % (ref 38.5–50.0)
Hemoglobin: 14.2 g/dL (ref 13.2–17.1)
Lymphs Abs: 1991 cells/uL (ref 850–3900)
MCH: 28.6 pg (ref 27.0–33.0)
MCHC: 32.6 g/dL (ref 32.0–36.0)
MCV: 87.9 fL (ref 80.0–100.0)
MPV: 11.1 fL (ref 7.5–12.5)
Monocytes Relative: 10.7 %
Neutro Abs: 1690 cells/uL (ref 1500–7800)
Neutrophils Relative %: 39.3 %
Platelets: 213 10*3/uL (ref 140–400)
RBC: 4.96 10*6/uL (ref 4.20–5.80)
RDW: 13.6 % (ref 11.0–15.0)
Total Lymphocyte: 46.3 %
WBC: 4.3 10*3/uL (ref 3.8–10.8)

## 2020-05-27 LAB — LIPID PANEL
Cholesterol: 176 mg/dL (ref <200)
HDL: 45 mg/dL (ref >=40)
LDL Cholesterol (Calc): 103 mg/dL (calc) — ABNORMAL HIGH
Non-HDL Cholesterol (Calc): 131 mg/dL (calc) — ABNORMAL HIGH (ref <130)
Total CHOL/HDL Ratio: 3.9 (calc) (ref <5.0)
Triglycerides: 167 mg/dL — ABNORMAL HIGH (ref <150)

## 2020-05-27 LAB — COMPREHENSIVE METABOLIC PANEL
AG Ratio: 1.4 (calc) (ref 1.0–2.5)
ALT: 55 U/L — ABNORMAL HIGH (ref 9–46)
AST: 28 U/L (ref 10–35)
Albumin: 4.4 g/dL (ref 3.6–5.1)
Alkaline phosphatase (APISO): 78 U/L (ref 35–144)
BUN: 14 mg/dL (ref 7–25)
CO2: 26 mmol/L (ref 20–32)
Calcium: 10 mg/dL (ref 8.6–10.3)
Chloride: 104 mmol/L (ref 98–110)
Creat: 1.12 mg/dL (ref 0.70–1.33)
Globulin: 3.1 g/dL (calc) (ref 1.9–3.7)
Glucose, Bld: 124 mg/dL — ABNORMAL HIGH (ref 65–99)
Potassium: 4.4 mmol/L (ref 3.5–5.3)
Sodium: 139 mmol/L (ref 135–146)
Total Bilirubin: 0.7 mg/dL (ref 0.2–1.2)
Total Protein: 7.5 g/dL (ref 6.1–8.1)

## 2020-05-27 LAB — HEMOGLOBIN A1C
Hgb A1c MFr Bld: 6.7 % of total Hgb — ABNORMAL HIGH (ref <5.7)
Mean Plasma Glucose: 146 mg/dL
eAG (mmol/L): 8.1 mmol/L

## 2020-05-28 ENCOUNTER — Encounter: Payer: Self-pay | Admitting: Family Medicine

## 2020-05-28 NOTE — Assessment & Plan Note (Signed)
Recheck lipids on pravastatin

## 2020-05-28 NOTE — Assessment & Plan Note (Signed)
Continue uloric Pain meds are for flares At this time increased dose is not needed Advised gout is an inflammatory condition and the colchicine is the best treatment to reduce the acid in the joint when it flares, he can then use the hydrocodone for breakthrough pain

## 2020-05-28 NOTE — Assessment & Plan Note (Signed)
Diastolic is elevated today  Will have him check BP at home If levels > 140/90 Increase the losartan to 100mg  once a day

## 2020-05-28 NOTE — Assessment & Plan Note (Signed)
Recheck A1C currently diet controlled Discussed plating method with veggies, protein and carb

## 2020-05-29 ENCOUNTER — Other Ambulatory Visit: Payer: Self-pay | Admitting: Family Medicine

## 2020-05-30 ENCOUNTER — Other Ambulatory Visit: Payer: Self-pay

## 2020-05-30 DIAGNOSIS — R7989 Other specified abnormal findings of blood chemistry: Secondary | ICD-10-CM

## 2020-06-20 ENCOUNTER — Ambulatory Visit: Payer: Medicaid Other

## 2020-06-20 ENCOUNTER — Other Ambulatory Visit: Payer: Self-pay

## 2020-06-20 DIAGNOSIS — R7989 Other specified abnormal findings of blood chemistry: Secondary | ICD-10-CM

## 2020-06-21 LAB — COMPLETE METABOLIC PANEL WITH GFR
AG Ratio: 1.6 (calc) (ref 1.0–2.5)
ALT: 20 U/L (ref 9–46)
AST: 16 U/L (ref 10–35)
Albumin: 4.5 g/dL (ref 3.6–5.1)
Alkaline phosphatase (APISO): 83 U/L (ref 35–144)
BUN: 16 mg/dL (ref 7–25)
CO2: 23 mmol/L (ref 20–32)
Calcium: 9.8 mg/dL (ref 8.6–10.3)
Chloride: 107 mmol/L (ref 98–110)
Creat: 1.18 mg/dL (ref 0.70–1.33)
GFR, Est African American: 80 mL/min/{1.73_m2} (ref >=60)
GFR, Est Non African American: 69 mL/min/{1.73_m2} (ref >=60)
Globulin: 2.9 g/dL (calc) (ref 1.9–3.7)
Glucose, Bld: 122 mg/dL — ABNORMAL HIGH (ref 65–99)
Potassium: 4.6 mmol/L (ref 3.5–5.3)
Sodium: 140 mmol/L (ref 135–146)
Total Bilirubin: 0.7 mg/dL (ref 0.2–1.2)
Total Protein: 7.4 g/dL (ref 6.1–8.1)

## 2020-07-25 ENCOUNTER — Other Ambulatory Visit: Payer: Self-pay | Admitting: Family Medicine

## 2020-07-25 NOTE — Telephone Encounter (Signed)
Patient called to follow up on request for prior auth for   atenolol (TENORMIN) 100 MG tablet [[id number]]    Patient requesting 90 day supply.  Pharmacy:   [facility] [address]  [place], [place] [address]  Phone:  [phone] Fax:  [phone]   Please advise patient at [phone] when prior auth obtained and Rx called in.

## 2020-07-26 ENCOUNTER — Other Ambulatory Visit: Payer: Self-pay | Admitting: Family Medicine

## 2020-07-26 DIAGNOSIS — E785 Hyperlipidemia, unspecified: Secondary | ICD-10-CM

## 2020-08-14 ENCOUNTER — Other Ambulatory Visit: Payer: Self-pay | Admitting: Family Medicine

## 2020-08-15 ENCOUNTER — Telehealth: Payer: Self-pay | Admitting: Family Medicine

## 2020-08-15 DIAGNOSIS — M1A09X0 Idiopathic chronic gout, multiple sites, without tophus (tophi): Secondary | ICD-10-CM

## 2020-08-15 MED ORDER — COLCHICINE 0.6 MG PO TABS: 0.6000 mg | ORAL_TABLET | Freq: Two times a day (BID) | ORAL | 1 refills | Status: DC | PRN

## 2020-08-15 NOTE — Telephone Encounter (Signed)
Patient called to request refill of colchicine 0.6 MG tablet [[id number]]  For gout flare-up. Patient only has a few pills left.   Pharmacy: [facility] [place] [facility] [address]  [place], [place] [address]  Phone:  [phone] Fax:  [phone]   Please advise patient when refill called in at [phone]

## 2020-08-25 ENCOUNTER — Other Ambulatory Visit: Payer: Self-pay | Admitting: Family Medicine

## 2020-08-25 DIAGNOSIS — I10 Essential (primary) hypertension: Secondary | ICD-10-CM

## 2020-10-23 ENCOUNTER — Telehealth: Payer: Self-pay | Admitting: Family Medicine

## 2020-10-23 ENCOUNTER — Other Ambulatory Visit: Payer: Self-pay

## 2020-10-23 MED ORDER — ATENOLOL 100 MG PO TABS: ORAL_TABLET | ORAL | 0 refills | Status: DC

## 2020-10-23 NOTE — Telephone Encounter (Signed)
Pt sent 10 days of meds to last until office visit 7/18

## 2020-10-23 NOTE — Telephone Encounter (Signed)
Pt called in requesting a refill of atenolol (TENORMIN) 100 MG tablet.  Pt is scheduled to see Dr on 10/30/20 but will be out of this med before this date, pt would like to get a refill to last him until this appt if possible. Please advise  Cb#: [phone]

## 2020-10-30 ENCOUNTER — Encounter: Payer: Self-pay | Admitting: *Deleted

## 2020-10-30 ENCOUNTER — Other Ambulatory Visit: Payer: Self-pay

## 2020-10-30 ENCOUNTER — Ambulatory Visit: Payer: Medicaid Other | Admitting: Family Medicine

## 2020-10-30 ENCOUNTER — Encounter: Payer: Self-pay | Admitting: Family Medicine

## 2020-10-30 VITALS — BP 128/74 | HR 76 | Temp 98.0°F | Resp 14 | Ht 72.0 in | Wt 259.0 lb

## 2020-10-30 DIAGNOSIS — E1169 Type 2 diabetes mellitus with other specified complication: Secondary | ICD-10-CM

## 2020-10-30 DIAGNOSIS — I10 Essential (primary) hypertension: Secondary | ICD-10-CM | POA: Diagnosis not present

## 2020-10-30 DIAGNOSIS — E785 Hyperlipidemia, unspecified: Secondary | ICD-10-CM

## 2020-10-30 DIAGNOSIS — M1A09X0 Idiopathic chronic gout, multiple sites, without tophus (tophi): Secondary | ICD-10-CM | POA: Diagnosis not present

## 2020-10-30 NOTE — Progress Notes (Signed)
Subjective:    Patient ID: Xavier Daniels, male    DOB: 1965-08-29, 55 y.o.   MRN: [id number]  HPI Patient is a very pleasant 55 year old African-American gentleman who presents today for follow-up.  He has a history of borderline type 2 diabetes mellitus currently managed with diet.  He also has a history of hypertension.  His blood pressure today is excellent.  He denies any chest pain shortness of breath or dyspnea on exertion.  He also has a history of gout but he denies any recent gout exacerbations due to the fact he is taking Uloric 80 mg a day.  He also has a history of hyperlipidemia.  He is due today to recheck his fasting lab work.  We spent more than 20 minutes today discussing dietary changes for type 2 diabetes mellitus.  Diabetic eye exam was performed within the last 12 months.  Foot exam was performed today and is normal Past Medical History:  Diagnosis Date   Arthritis    Colon cancer screening 10/20/2018   Gout    Gout    Hypertension    Substance abuse ([place])    ETOH, 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, CLEAN FOR 13 YEARS   Past Surgical History:  Procedure Laterality Date   BIOPSY  04/20/2019   Procedure: BIOPSY;  Surgeon: Danie Binder, MD;  [facility];  Service: Endoscopy;;  transverse polyp x1   COLONOSCOPY WITH PROPOFOL N/A 04/20/2019   Procedure: COLONOSCOPY WITH PROPOFOL;  Surgeon: Danie Binder, MD;  [facility];  Service: Endoscopy;  Laterality: N/A;  9:30am-office rescheduled 04/20/19 @ 8:30am   POLYPECTOMY  04/20/2019   Procedure: POLYPECTOMY;  Surgeon: Danie Binder, MD;  [facility];  Service: Endoscopy;;  transversex1, rectal polyp   TOTAL HIP ARTHROPLASTY  2007   due to injury for avascular necrosis.   Current Outpatient Medications on File Prior to Visit  Medication Sig Dispense Refill   atenolol (TENORMIN) 100 MG tablet TAKE ONE (1) TABLET BY MOUTH EVERY DAY 10 tablet 0   colchicine 0.6 MG tablet Take 1 tablet (0.6 mg total) by  mouth 2 (two) times daily as needed (gout). 30 tablet 1   diclofenac Sodium (VOLTAREN) 1 % GEL APPLY 2 GRAMS FOUR TIMES A DAY 100 g 3   Febuxostat 80 MG TABS TAKE ONE (1) TABLET BY MOUTH EVERY DAY FOR GOUT 90 tablet 3   fluticasone (FLONASE) 50 MCG/ACT nasal spray 2 spray each nostril daily prn allergies 16 g 1   HYDROcodone-acetaminophen (NORCO) 7.5-325 MG tablet TAKE 1 TABLET BY MOUTH EVERY 6 HOURS AS NEEDED FOR MODERATE PAIN 30 tablet 0   ibuprofen (ADVIL) 800 MG tablet Take 1 tablet (800 mg total) by mouth every 8 (eight) hours as needed (pain). 60 tablet 1   ketotifen (ZADITOR) 0.025 % ophthalmic solution Place 1 drop into both eyes daily.     losartan (COZAAR) 50 MG tablet TAKE ONE (1) TABLET BY MOUTH EVERY DAY 90 tablet 3   pravastatin (PRAVACHOL) 40 MG tablet TAKE 1 TABLET (40 MG TOTAL) BY MOUTH DAILY. 90 tablet 3   No current facility-administered medications on file prior to visit.   No Known Allergies Social History   Socioeconomic History   Marital status: Married    Spouse name: Not on file   Number of children: Not on file   Years of education: Not on file   Highest education level: Not on file  Occupational History   Not  on file  Tobacco Use   Smoking status: Never   Smokeless tobacco: Never  Vaping Use   Vaping Use: Never used  Substance and Sexual Activity   Alcohol use: No    Comment: previous heavy use, quit 2004   Drug use: No    Comment: previous marijuana use   Sexual activity: Yes    Birth control/protection: None  Other Topics Concern   Not on file  Social History Narrative   Not on file   Social Determinants of Health   Financial Resource Strain: Not on file  Food Insecurity: Not on file  Transportation Needs: Not on file  Physical Activity: Not on file  Stress: Not on file  Social Connections: Not on file  Intimate Partner Violence: Not on file      Review of Systems  All other systems reviewed and are negative.     Objective:    Physical Exam Vitals reviewed.  Constitutional:      Appearance: Normal appearance.  Cardiovascular:     Rate and Rhythm: Normal rate and regular rhythm.     Pulses: Normal pulses.     Heart sounds: Normal heart sounds. No murmur heard.   No friction rub. No gallop.  Pulmonary:     Effort: Pulmonary effort is normal. No respiratory distress.     Breath sounds: Normal breath sounds. No stridor. No wheezing, rhonchi or rales.  Abdominal:     General: Abdomen is flat. Bowel sounds are normal.     Palpations: Abdomen is soft.  Musculoskeletal:     Right lower leg: No edema.     Left lower leg: No edema.  Lymphadenopathy:     Cervical: No cervical adenopathy.  Neurological:     Mental Status: He is alert.          Assessment & Plan:  Type 2 diabetes mellitus with other specified complication, without long-term current use of insulin (HCC) - Plan: CBC with Differential/Platelet, COMPLETE METABOLIC PANEL WITH GFR, Lipid panel, Microalbumin, urine, Hemoglobin A1c  Idiopathic chronic gout of multiple sites without tophus - Plan: Uric acid  Hyperlipidemia, unspecified hyperlipidemia type  Essential hypertension Blood pressure today is outstanding.  Come in fasting for a CBC CMP fasting lipid panel A1c and urine microalbumin.  We discussed diabetic diet.  Recommended 45 g of carbs per meal and 15 g of carbs per snack.  Recommended avoiding bread, rice, potatoes, pasta.  Recommended avoiding sweets and soda.  Recommended reducing the salt content of his food such as rinsing off canned foods prior to eating them.  Try to get 30 minutes a day 5 days a week of aerobic exercise.  Check uric acid to ensure adequate dosage of Uloric.  Goal uric acid is less than 6.  Goal hemoglobin A1c is less than 6.5.

## 2020-10-31 ENCOUNTER — Other Ambulatory Visit: Payer: Medicaid Other

## 2020-10-31 DIAGNOSIS — E1169 Type 2 diabetes mellitus with other specified complication: Secondary | ICD-10-CM | POA: Diagnosis not present

## 2020-10-31 DIAGNOSIS — M1A09X0 Idiopathic chronic gout, multiple sites, without tophus (tophi): Secondary | ICD-10-CM | POA: Diagnosis not present

## 2020-11-01 LAB — COMPLETE METABOLIC PANEL WITH GFR
AG Ratio: 1.5 (calc) (ref 1.0–2.5)
ALT: 25 U/L (ref 9–46)
AST: 23 U/L (ref 10–35)
Albumin: 4.5 g/dL (ref 3.6–5.1)
Alkaline phosphatase (APISO): 73 U/L (ref 35–144)
BUN: 16 mg/dL (ref 7–25)
CO2: 26 mmol/L (ref 20–32)
Calcium: 9.7 mg/dL (ref 8.6–10.3)
Chloride: 106 mmol/L (ref 98–110)
Creat: 1.27 mg/dL (ref 0.70–1.30)
Globulin: 3 g/dL (calc) (ref 1.9–3.7)
Glucose, Bld: 114 mg/dL — ABNORMAL HIGH (ref 65–99)
Potassium: 4.5 mmol/L (ref 3.5–5.3)
Sodium: 138 mmol/L (ref 135–146)
Total Bilirubin: 0.5 mg/dL (ref 0.2–1.2)
Total Protein: 7.5 g/dL (ref 6.1–8.1)
eGFR: 67 mL/min/{1.73_m2} (ref >=60)

## 2020-11-01 LAB — CBC WITH DIFFERENTIAL/PLATELET
Absolute Monocytes: 378 cells/uL (ref 200–950)
Basophils Absolute: 39 cells/uL (ref 0–200)
Basophils Relative: 1 %
Eosinophils Absolute: 70 cells/uL (ref 15–500)
Eosinophils Relative: 1.8 %
HCT: 41.5 % (ref 38.5–50.0)
Hemoglobin: 13.6 g/dL (ref 13.2–17.1)
Lymphs Abs: 1895 cells/uL (ref 850–3900)
MCH: 28.7 pg (ref 27.0–33.0)
MCHC: 32.8 g/dL (ref 32.0–36.0)
MCV: 87.6 fL (ref 80.0–100.0)
MPV: 10.8 fL (ref 7.5–12.5)
Monocytes Relative: 9.7 %
Neutro Abs: 1517 cells/uL (ref 1500–7800)
Neutrophils Relative %: 38.9 %
Platelets: 191 10*3/uL (ref 140–400)
RBC: 4.74 10*6/uL (ref 4.20–5.80)
RDW: 13.6 % (ref 11.0–15.0)
Total Lymphocyte: 48.6 %
WBC: 3.9 10*3/uL (ref 3.8–10.8)

## 2020-11-01 LAB — MICROALBUMIN, URINE: Microalb, Ur: <0.2 mg/dL

## 2020-11-01 LAB — LIPID PANEL
Cholesterol: 159 mg/dL (ref <200)
HDL: 41 mg/dL (ref >=40)
LDL Cholesterol (Calc): 94 mg/dL (calc)
Non-HDL Cholesterol (Calc): 118 mg/dL (calc) (ref <130)
Total CHOL/HDL Ratio: 3.9 (calc) (ref <5.0)
Triglycerides: 147 mg/dL (ref <150)

## 2020-11-01 LAB — URIC ACID: Uric Acid, Serum: 5.1 mg/dL (ref 4.0–8.0)

## 2020-11-01 LAB — HEMOGLOBIN A1C
Hgb A1c MFr Bld: 7 % of total Hgb — ABNORMAL HIGH (ref <5.7)
Mean Plasma Glucose: 154 mg/dL
eAG (mmol/L): 8.5 mmol/L

## 2020-11-02 ENCOUNTER — Other Ambulatory Visit: Payer: Self-pay | Admitting: *Deleted

## 2020-11-02 MED ORDER — METFORMIN HCL 500 MG PO TABS: ORAL_TABLET | ORAL | 0 refills | Status: DC

## 2020-12-12 ENCOUNTER — Other Ambulatory Visit: Payer: Self-pay | Admitting: Family Medicine

## 2020-12-13 NOTE — Telephone Encounter (Signed)
Ok to refill??  Last office visit 10/30/2020.  Last refill 05/30/2020.

## 2021-02-09 ENCOUNTER — Ambulatory Visit (INDEPENDENT_AMBULATORY_CARE_PROVIDER_SITE_OTHER): Payer: Medicaid Other | Admitting: *Deleted

## 2021-02-09 ENCOUNTER — Other Ambulatory Visit: Payer: Self-pay

## 2021-02-09 DIAGNOSIS — Z23 Encounter for immunization: Secondary | ICD-10-CM | POA: Diagnosis not present

## 2021-02-15 ENCOUNTER — Ambulatory Visit: Payer: Medicaid Other | Admitting: Family Medicine

## 2021-02-15 ENCOUNTER — Encounter: Payer: Self-pay | Admitting: Family Medicine

## 2021-02-15 ENCOUNTER — Other Ambulatory Visit: Payer: Self-pay

## 2021-02-15 VITALS — BP 132/78 | HR 70 | Temp 97.9°F | Resp 16 | Ht 72.0 in | Wt 242.0 lb

## 2021-02-15 DIAGNOSIS — M1A09X0 Idiopathic chronic gout, multiple sites, without tophus (tophi): Secondary | ICD-10-CM

## 2021-02-15 DIAGNOSIS — E1169 Type 2 diabetes mellitus with other specified complication: Secondary | ICD-10-CM | POA: Diagnosis not present

## 2021-02-15 DIAGNOSIS — I10 Essential (primary) hypertension: Secondary | ICD-10-CM | POA: Diagnosis not present

## 2021-02-15 MED ORDER — AMLODIPINE BESYLATE 10 MG PO TABS: 10.0000 mg | ORAL_TABLET | Freq: Every day | ORAL | 3 refills | Status: DC

## 2021-02-15 MED ORDER — COLCHICINE 0.6 MG PO TABS: 0.6000 mg | ORAL_TABLET | Freq: Two times a day (BID) | ORAL | 1 refills | Status: DC | PRN

## 2021-02-15 MED ORDER — LOSARTAN POTASSIUM 50 MG PO TABS: ORAL_TABLET | ORAL | 3 refills | Status: DC

## 2021-02-15 MED ORDER — SILDENAFIL CITRATE 100 MG PO TABS: 50.0000 mg | ORAL_TABLET | Freq: Every day | ORAL | 11 refills | Status: DC | PRN

## 2021-02-15 MED ORDER — HYDROCODONE-ACETAMINOPHEN 7.5-325 MG PO TABS
ORAL_TABLET | ORAL | 0 refills | Status: DC
Start: 2021-02-15 — End: 2021-04-20

## 2021-02-15 MED ORDER — EMPAGLIFLOZIN 25 MG PO TABS: 25.0000 mg | ORAL_TABLET | Freq: Every day | ORAL | 11 refills | Status: DC

## 2021-02-15 NOTE — Progress Notes (Signed)
Subjective:    Patient ID: Xavier Daniels, male    DOB: May 09, 1965, 55 y.o.   MRN: [id number]  HPI Patient has a couple of concerns.  First he is recently started experiencing erectile dysfunction.  He states he has never had a problem with it before but now recently he has had a difficult time achieving and maintaining an erection.  This has him concerned.  He has not tried any Viagra.  He is on atenolol along with losartan and does have a history of diabetes.  Second he reports GI upset on metformin.  He states that he can even go places after he takes metformin due to the potential for diarrhea Past Medical History:  Diagnosis Date   Arthritis    Colon cancer screening 10/20/2018   Diabetes mellitus without complication (HCC)    Gout    Gout    Hypertension    Substance abuse ([place])    ETOH, 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, CLEAN FOR 13 YEARS   Past Surgical History:  Procedure Laterality Date   BIOPSY  04/20/2019   Procedure: BIOPSY;  Surgeon: Danie Binder, MD;  [facility];  Service: Endoscopy;;  transverse polyp x1   COLONOSCOPY WITH PROPOFOL N/A 04/20/2019   Procedure: COLONOSCOPY WITH PROPOFOL;  Surgeon: Danie Binder, MD;  [facility];  Service: Endoscopy;  Laterality: N/A;  9:30am-office rescheduled 04/20/19 @ 8:30am   POLYPECTOMY  04/20/2019   Procedure: POLYPECTOMY;  Surgeon: Danie Binder, MD;  [facility];  Service: Endoscopy;;  transversex1, rectal polyp   TOTAL HIP ARTHROPLASTY  2007   due to injury for avascular necrosis.   Current Outpatient Medications on File Prior to Visit  Medication Sig Dispense Refill   atenolol (TENORMIN) 100 MG tablet TAKE ONE (1) TABLET BY MOUTH EVERY DAY 10 tablet 0   diclofenac Sodium (VOLTAREN) 1 % GEL APPLY 2 GRAMS FOUR TIMES A DAY 100 g 3   Febuxostat 80 MG TABS TAKE ONE (1) TABLET BY MOUTH EVERY DAY FOR GOUT 90 tablet 3   fluticasone (FLONASE) 50 MCG/ACT nasal spray 2 spray each nostril daily prn allergies 16 g  1   HYDROcodone-acetaminophen (NORCO) 7.5-325 MG tablet TAKE ONE (1) TABLET BY MOUTH EVERY 6 HOURS 30 tablet 0   ibuprofen (ADVIL) 800 MG tablet Take 1 tablet (800 mg total) by mouth every 8 (eight) hours as needed (pain). 60 tablet 1   ketotifen (ZADITOR) 0.025 % ophthalmic solution Place 1 drop into both eyes daily.     metFORMIN (GLUCOPHAGE) 500 MG tablet TAKE  2 TABLETS (1000MG ) TWICE A DAY 120 tablet 6   pravastatin (PRAVACHOL) 40 MG tablet TAKE 1 TABLET (40 MG TOTAL) BY MOUTH DAILY. 90 tablet 3   No current facility-administered medications on file prior to visit.   No Known Allergies Social History   Socioeconomic History   Marital status: Married    Spouse name: Not on file   Number of children: Not on file   Years of education: Not on file   Highest education level: Not on file  Occupational History   Not on file  Tobacco Use   Smoking status: Never   Smokeless tobacco: Never  Vaping Use   Vaping Use: Never used  Substance and Sexual Activity   Alcohol use: No    Comment: previous heavy use, quit 2004   Drug use: No    Comment: previous marijuana use   Sexual activity: Yes  Birth control/protection: None  Other Topics Concern   Not on file  Social History Narrative   Not on file   Social Determinants of Health   Financial Resource Strain: Not on file  Food Insecurity: Not on file  Transportation Needs: Not on file  Physical Activity: Not on file  Stress: Not on file  Social Connections: Not on file  Intimate Partner Violence: Not on file      Review of Systems  All other systems reviewed and are negative.     Objective:   Physical Exam Vitals reviewed.  Constitutional:      Appearance: Normal appearance.  Cardiovascular:     Rate and Rhythm: Normal rate and regular rhythm.     Pulses: Normal pulses.     Heart sounds: Normal heart sounds. No murmur heard.   No friction rub. No gallop.  Pulmonary:     Effort: Pulmonary effort is normal. No  respiratory distress.     Breath sounds: Normal breath sounds. No stridor. No wheezing, rhonchi or rales.  Abdominal:     General: Abdomen is flat. Bowel sounds are normal.     Palpations: Abdomen is soft.  Musculoskeletal:     Right lower leg: No edema.     Left lower leg: No edema.  Lymphadenopathy:     Cervical: No cervical adenopathy.  Neurological:     Mental Status: He is alert.          Assessment & Plan:  Type 2 diabetes mellitus with other specified complication, without long-term current use of insulin (HCC)  Essential hypertension - Plan: losartan (COZAAR) 50 MG tablet  Idiopathic chronic gout of multiple sites without tophus - Plan: colchicine 0.6 MG tablet Due to erectile dysfunction, first we will wean him off atenolol.  Decrease to 50 mg a day for 1 week and then 25 mg a day for 1 week and then stop.  Replace with amlodipine 10 mg a day and see if this improves the erectile dysfunction.  If not, he could then try Viagra 50 to 100 mg daily as needed for erectile dysfunction.  Second, we will discontinue metformin and replace it with Jardiance 25 mg a day and then recheck in 3 months fasting lab work to see how this is maintaining his blood sugar

## 2021-02-15 NOTE — Addendum Note (Signed)
Addended by: Jenna Luo T on: 02/15/2021 12:47 PM   Modules accepted: Orders

## 2021-02-16 ENCOUNTER — Telehealth: Payer: Self-pay | Admitting: *Deleted

## 2021-02-16 MED ORDER — EMPAGLIFLOZIN 25 MG PO TABS
25.0000 mg | ORAL_TABLET | Freq: Every day | ORAL | 11 refills | Status: DC
Start: 2021-02-16 — End: 2022-02-21

## 2021-02-16 NOTE — Telephone Encounter (Signed)
Received request from pharmacy for [place] on Jardiance.  PA submitted.   Dx: E11.9- DM.   Received immediate determination.   PA Case: [id number] approved 02/16/2021- 02/16/2022.

## 2021-02-27 ENCOUNTER — Other Ambulatory Visit: Payer: Self-pay | Admitting: Family Medicine

## 2021-02-27 DIAGNOSIS — M25572 Pain in left ankle and joints of left foot: Secondary | ICD-10-CM

## 2021-03-20 ENCOUNTER — Other Ambulatory Visit: Payer: Self-pay

## 2021-03-20 ENCOUNTER — Ambulatory Visit (INDEPENDENT_AMBULATORY_CARE_PROVIDER_SITE_OTHER): Payer: Medicaid Other | Admitting: Family Medicine

## 2021-03-20 ENCOUNTER — Encounter: Payer: Self-pay | Admitting: Family Medicine

## 2021-03-20 VITALS — BP 138/82 | HR 98 | Resp 18 | Ht 72.0 in | Wt 244.0 lb

## 2021-03-20 DIAGNOSIS — S86811A Strain of other muscle(s) and tendon(s) at lower leg level, right leg, initial encounter: Secondary | ICD-10-CM | POA: Diagnosis not present

## 2021-03-20 NOTE — Progress Notes (Signed)
Subjective:    Patient ID: Xavier Daniels, male    DOB: March 08, 1966, 55 y.o.   MRN: [id number]  HPI 02/15/21 Patient has a couple of concerns.  First he is recently started experiencing erectile dysfunction.  He states he has never had a problem with it before but now recently he has had a difficult time achieving and maintaining an erection.  This has him concerned.  He has not tried any Viagra.  He is on atenolol along with losartan and does have a history of diabetes.  Second he reports GI upset on metformin.  He states that he can even go places after he takes metformin due to the potential for diarrhea.  At that time, my plan was:  Due to erectile dysfunction, first we will wean him off atenolol.  Decrease to 50 mg a day for 1 week and then 25 mg a day for 1 week and then stop.  Replace with amlodipine 10 mg a day and see if this improves the erectile dysfunction.  If not, he could then try Viagra 50 to 100 mg daily as needed for erectile dysfunction.  Second, we will discontinue metformin and replace it with Jardiance 25 mg a day and then recheck in 3 months fasting lab work to see how this is maintaining his blood sugar  03/20/21 Patient states that 4 or 5 days ago, he was stepping on a step.  He landed awkwardly on step and had pressed down with his wife fluid to maintain his balance when he felt a tearing sensation in his right calf.  He now has a swollen tender right calf.  There is bruising and a hematoma visible underneath his medial malleolus.  He is here today for evaluation.  Over the last few days pain and swelling in his right calf has improved.  He has a negative Thompson sign today.  He is fully able to plantarflex his right ankle without any pain.  He is able to resist dorsiflexion of his ankle against maximal resistance, poor.  Therefore his Achilles tendon seems to be intact.  There is no significant pain in his gastrocnemius muscle with plantarflexion or knee flexion.  He has normal  knee flexion.  However he does still have tenderness to palpation in his gastrocnemius muscle Past Medical History:  Diagnosis Date   Arthritis    Colon cancer screening 10/20/2018   Diabetes mellitus without complication (HCC)    Gout    Gout    Hypertension    Substance abuse ([place])    ETOH, 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, CLEAN FOR 13 YEARS   Past Surgical History:  Procedure Laterality Date   BIOPSY  04/20/2019   Procedure: BIOPSY;  Surgeon: Danie Binder, MD;  [facility];  Service: Endoscopy;;  transverse polyp x1   COLONOSCOPY WITH PROPOFOL N/A 04/20/2019   Procedure: COLONOSCOPY WITH PROPOFOL;  Surgeon: Danie Binder, MD;  [facility];  Service: Endoscopy;  Laterality: N/A;  9:30am-office rescheduled 04/20/19 @ 8:30am   POLYPECTOMY  04/20/2019   Procedure: POLYPECTOMY;  Surgeon: Danie Binder, MD;  [facility];  Service: Endoscopy;;  transversex1, rectal polyp   TOTAL HIP ARTHROPLASTY  2007   due to injury for avascular necrosis.   Current Outpatient Medications on File Prior to Visit  Medication Sig Dispense Refill   amLODipine (NORVASC) 10 MG tablet Take 1 tablet (10 mg total) by mouth daily. This replaces atenolol 90 tablet 3   colchicine  0.6 MG tablet Take 1 tablet (0.6 mg total) by mouth 2 (two) times daily as needed (gout). 30 tablet 1   diclofenac Sodium (VOLTAREN) 1 % GEL APPLY 2 GRAMS FOUR TIMES A DAY 100 g 3   empagliflozin (JARDIANCE) 25 MG TABS tablet Take 1 tablet (25 mg total) by mouth daily before breakfast. 30 tablet 11   Febuxostat 80 MG TABS TAKE ONE (1) TABLET BY MOUTH EVERY DAY FOR GOUT 90 tablet 3   fluticasone (FLONASE) 50 MCG/ACT nasal spray 2 spray each nostril daily prn allergies 16 g 1   HYDROcodone-acetaminophen (NORCO) 7.5-325 MG tablet TAKE ONE (1) TABLET BY MOUTH EVERY 6 HOURS 30 tablet 0   ibuprofen (ADVIL) 800 MG tablet Take 1 tablet (800 mg total) by mouth every 8 (eight) hours as needed (pain). 60 tablet 1   ketotifen  (ZADITOR) 0.025 % ophthalmic solution Place 1 drop into both eyes daily.     losartan (COZAAR) 50 MG tablet TAKE ONE (1) TABLET BY MOUTH EVERY DAY 90 tablet 3   pravastatin (PRAVACHOL) 40 MG tablet TAKE 1 TABLET (40 MG TOTAL) BY MOUTH DAILY. 90 tablet 3   sildenafil (VIAGRA) 100 MG tablet Take 0.5-1 tablets (50-100 mg total) by mouth daily as needed for erectile dysfunction. 5 tablet 11   No current facility-administered medications on file prior to visit.   No Known Allergies Social History   Socioeconomic History   Marital status: Married    Spouse name: Not on file   Number of children: Not on file   Years of education: Not on file   Highest education level: Not on file  Occupational History   Not on file  Tobacco Use   Smoking status: Never   Smokeless tobacco: Never  Vaping Use   Vaping Use: Never used  Substance and Sexual Activity   Alcohol use: No    Comment: previous heavy use, quit 2004   Drug use: No    Comment: previous marijuana use   Sexual activity: Yes    Birth control/protection: None  Other Topics Concern   Not on file  Social History Narrative   Not on file   Social Determinants of Health   Financial Resource Strain: Not on file  Food Insecurity: Not on file  Transportation Needs: Not on file  Physical Activity: Not on file  Stress: Not on file  Social Connections: Not on file  Intimate Partner Violence: Not on file      Review of Systems  All other systems reviewed and are negative.     Objective:   Physical Exam Vitals reviewed.  Constitutional:      Appearance: Normal appearance.  Cardiovascular:     Rate and Rhythm: Normal rate and regular rhythm.     Pulses: Normal pulses.     Heart sounds: Normal heart sounds. No murmur heard.   No friction rub. No gallop.  Pulmonary:     Effort: Pulmonary effort is normal. No respiratory distress.     Breath sounds: Normal breath sounds. No stridor. No wheezing, rhonchi or rales.  Abdominal:      General: Abdomen is flat. Bowel sounds are normal.     Palpations: Abdomen is soft.  Musculoskeletal:     Right knee: No bony tenderness. Normal range of motion. No tenderness.     Right lower leg: No edema.     Left lower leg: No edema.     Right ankle: Normal. No tenderness. Normal range of motion.  Right Achilles Tendon: Thompson's test negative.     Left ankle: Normal. No tenderness. Normal range of motion.     Left Achilles Tendon: Thompson's test negative.       Legs:       Feet:  Lymphadenopathy:     Cervical: No cervical adenopathy.  Neurological:     Mental Status: He is alert.          Assessment & Plan:  Strain of calf muscle, right, initial encounter Patient has partially torn gastrocnemius muscle.  However he still has full range of motion of the right ankle.  He has excellent strength plantarflexion.  There is no evidence of an Achilles tendon rupture.  Therefore I believe the tincture of time the pain will gradually improve probably over the next 3 to 4 weeks.  Patient states the pain is improving dramatically just over the last few days.  He is due for repeat lab work in March for his diabetes.  Follow-up in 1 month if no better or sooner if worsening

## 2021-04-19 ENCOUNTER — Other Ambulatory Visit: Payer: Self-pay | Admitting: Family Medicine

## 2021-06-12 ENCOUNTER — Other Ambulatory Visit: Payer: Self-pay | Admitting: Family Medicine

## 2021-06-12 DIAGNOSIS — M1A09X0 Idiopathic chronic gout, multiple sites, without tophus (tophi): Secondary | ICD-10-CM

## 2021-06-13 NOTE — Telephone Encounter (Signed)
LOV 03/20/21 ?Last refill ?Norco  04/20/21, #30, 0 refills ?Ibuprofen  02/23/20, #60, 1 refill ? ?Please review, thanks! ?

## 2021-06-26 ENCOUNTER — Other Ambulatory Visit: Payer: Medicaid Other

## 2021-06-26 ENCOUNTER — Other Ambulatory Visit: Payer: Self-pay

## 2021-06-26 DIAGNOSIS — I10 Essential (primary) hypertension: Secondary | ICD-10-CM | POA: Diagnosis not present

## 2021-06-26 DIAGNOSIS — E785 Hyperlipidemia, unspecified: Secondary | ICD-10-CM

## 2021-06-26 DIAGNOSIS — E1169 Type 2 diabetes mellitus with other specified complication: Secondary | ICD-10-CM | POA: Diagnosis not present

## 2021-06-27 LAB — CBC WITH DIFFERENTIAL/PLATELET
Absolute Monocytes: 322 cells/uL (ref 200–950)
Basophils Absolute: 41 cells/uL (ref 0–200)
Basophils Relative: 1.1 %
Eosinophils Absolute: 52 cells/uL (ref 15–500)
Eosinophils Relative: 1.4 %
HCT: 47.4 % (ref 38.5–50.0)
Hemoglobin: 15.4 g/dL (ref 13.2–17.1)
Lymphs Abs: 2046 cells/uL (ref 850–3900)
MCH: 28.4 pg (ref 27.0–33.0)
MCHC: 32.5 g/dL (ref 32.0–36.0)
MCV: 87.3 fL (ref 80.0–100.0)
MPV: 10.8 fL (ref 7.5–12.5)
Monocytes Relative: 8.7 %
Neutro Abs: 1240 cells/uL — ABNORMAL LOW (ref 1500–7800)
Neutrophils Relative %: 33.5 %
Platelets: 212 10*3/uL (ref 140–400)
RBC: 5.43 10*6/uL (ref 4.20–5.80)
RDW: 13.5 % (ref 11.0–15.0)
Total Lymphocyte: 55.3 %
WBC: 3.7 10*3/uL — ABNORMAL LOW (ref 3.8–10.8)

## 2021-06-27 LAB — LIPID PANEL
Cholesterol: 134 mg/dL (ref <200)
HDL: 35 mg/dL — ABNORMAL LOW (ref >=40)
LDL Cholesterol (Calc): 79 mg/dL (calc)
Non-HDL Cholesterol (Calc): 99 mg/dL (calc) (ref <130)
Total CHOL/HDL Ratio: 3.8 (calc) (ref <5.0)
Triglycerides: 110 mg/dL (ref <150)

## 2021-06-27 LAB — COMPREHENSIVE METABOLIC PANEL
AG Ratio: 1.4 (calc) (ref 1.0–2.5)
ALT: 20 U/L (ref 9–46)
AST: 20 U/L (ref 10–35)
Albumin: 4.5 g/dL (ref 3.6–5.1)
Alkaline phosphatase (APISO): 73 U/L (ref 35–144)
BUN: 17 mg/dL (ref 7–25)
CO2: 22 mmol/L (ref 20–32)
Calcium: 9.6 mg/dL (ref 8.6–10.3)
Chloride: 106 mmol/L (ref 98–110)
Creat: 1.23 mg/dL (ref 0.70–1.30)
Globulin: 3.3 g/dL (calc) (ref 1.9–3.7)
Glucose, Bld: 124 mg/dL — ABNORMAL HIGH (ref 65–99)
Potassium: 4.3 mmol/L (ref 3.5–5.3)
Sodium: 140 mmol/L (ref 135–146)
Total Bilirubin: 0.8 mg/dL (ref 0.2–1.2)
Total Protein: 7.8 g/dL (ref 6.1–8.1)

## 2021-06-27 LAB — HEMOGLOBIN A1C
Hgb A1c MFr Bld: 6.8 % of total Hgb — ABNORMAL HIGH (ref <5.7)
Mean Plasma Glucose: 148 mg/dL
eAG (mmol/L): 8.2 mmol/L

## 2021-06-28 ENCOUNTER — Ambulatory Visit: Payer: Medicaid Other | Admitting: Cardiology

## 2021-07-02 ENCOUNTER — Ambulatory Visit: Payer: Medicaid Other | Admitting: Family Medicine

## 2021-07-02 ENCOUNTER — Other Ambulatory Visit: Payer: Self-pay

## 2021-07-02 VITALS — BP 142/80 | HR 100 | Temp 98.5°F | Wt 247.0 lb

## 2021-07-02 DIAGNOSIS — E1169 Type 2 diabetes mellitus with other specified complication: Secondary | ICD-10-CM | POA: Diagnosis not present

## 2021-07-02 DIAGNOSIS — I10 Essential (primary) hypertension: Secondary | ICD-10-CM

## 2021-07-02 DIAGNOSIS — M1A09X0 Idiopathic chronic gout, multiple sites, without tophus (tophi): Secondary | ICD-10-CM

## 2021-07-02 DIAGNOSIS — E785 Hyperlipidemia, unspecified: Secondary | ICD-10-CM

## 2021-07-02 MED ORDER — ALLOPURINOL 300 MG PO TABS: 300.0000 mg | ORAL_TABLET | Freq: Every day | ORAL | 6 refills | Status: DC

## 2021-07-02 NOTE — Progress Notes (Signed)
? ?Subjective:  ? ? Patient ID: Xavier Daniels, male    DOB: 08-Oct-1965, 57 y.o.   MRN: [id number] ? ?HPI ?Patient is a very pleasant 56 year old African-American gentleman with a history of gout, type 2 diabetes mellitus, hypertension, and hyperlipidemia.  He is here today for follow-up of his chronic health conditions.  His blood pressure on initial intake was 150/80.  After resting a while it dropped to 142/80 but remains high.  He denies any chest pain shortness of breath or dyspnea on exertion.  He has been trying to exercise a little bit every day.  As result his cholesterol continues to improve.  He denies any side effects on his current medication.  He denies any polyuria polydipsia or blurry vision.  Diabetic foot exam was performed today and is normal.  He has not had a gout attack in quite some time.  We discussed the increased risk of cardiovascular disease on Uloric as compared to allopurinol and he is willing to try to switch back to Uloric to see if it would work now that he has changed his diet.  Otherwise he is doing well.  His erectile dysfunction has improved after discontinuing atenolol and switching to amlodipine. ?Past Medical History:  ?Diagnosis Date  ? Arthritis   ? Colon cancer screening 10/20/2018  ? Diabetes mellitus without complication ([place])   ? Gout   ? Gout   ? Hypertension   ? Substance abuse ([place])   ? 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 ?Past Surgical History:  ?Procedure Laterality Date  ? BIOPSY  04/20/2019  ? Procedure: BIOPSY;  Surgeon: Danie Binder, MD;  [facility];  Service: Endoscopy;;  transverse polyp x1  ? COLONOSCOPY WITH PROPOFOL N/A 04/20/2019  ? Procedure: COLONOSCOPY WITH PROPOFOL;  Surgeon: Danie Binder, MD;  [facility];  Service: Endoscopy;  Laterality: N/A;  9:30am-office rescheduled 04/20/19 @ 8:30am  ? POLYPECTOMY  04/20/2019  ? Procedure: POLYPECTOMY;  Surgeon: Danie Binder, MD;  [facility];  Service:  Endoscopy;;  transversex1, rectal polyp  ? TOTAL HIP ARTHROPLASTY  2007  ? due to injury for avascular necrosis.  ? ?Current Outpatient Medications on File Prior to Visit  ?Medication Sig Dispense Refill  ? amLODipine (NORVASC) 10 MG tablet Take 1 tablet (10 mg total) by mouth daily. This replaces atenolol 90 tablet 3  ? colchicine 0.6 MG tablet Take 1 tablet (0.6 mg total) by mouth 2 (two) times daily as needed (gout). 30 tablet 1  ? empagliflozin (JARDIANCE) 25 MG TABS tablet Take 1 tablet (25 mg total) by mouth daily before breakfast. 30 tablet 11  ? Febuxostat 80 MG TABS TAKE ONE (1) TABLET BY MOUTH EVERY DAY FOR GOUT 90 tablet 3  ? fluticasone (FLONASE) 50 MCG/ACT nasal spray 2 spray each nostril daily prn allergies 16 g 1  ? HYDROcodone-acetaminophen (NORCO) 7.5-325 MG tablet TAKE ONE (1) TABLET BY MOUTH EVERY 6 HOURS 30 tablet 0  ? ibuprofen (ADVIL) 800 MG tablet TAKE ONE TABLET BY MOUTH EVERY EIGHT HOURS AS NEEDED 60 tablet 1  ? losartan (COZAAR) 50 MG tablet TAKE ONE (1) TABLET BY MOUTH EVERY DAY 90 tablet 3  ? pravastatin (PRAVACHOL) 40 MG tablet TAKE 1 TABLET (40 MG TOTAL) BY MOUTH DAILY. 90 tablet 3  ? sildenafil (VIAGRA) 100 MG tablet Take 0.5-1 tablets (50-100 mg total) by mouth daily as needed for erectile dysfunction. 5 tablet 11  ? ?No current  facility-administered medications on file prior to visit.  ? ?No Known Allergies ?Social History  ? ?Socioeconomic History  ? Marital status: Married  ?  Spouse name: Not on file  ? Number of children: Not on file  ? Years of education: Not on file  ? Highest education level: Not on file  ?Occupational History  ? Not on file  ?Tobacco Use  ? Smoking status: Never  ? Smokeless tobacco: Never  ?Vaping Use  ? Vaping Use: Never used  ?Substance and Sexual Activity  ? Alcohol use: No  ?  Comment: previous heavy use, quit 2004  ? Drug use: No  ?  Comment: previous marijuana use  ? Sexual activity: Yes  ?  Birth control/protection: None  ?Other Topics Concern  ? Not  on file  ?Social History Narrative  ? Not on file  ? ?Social Determinants of Health  ? ?Financial Resource Strain: Not on file  ?Food Insecurity: Not on file  ?Transportation Needs: Not on file  ?Physical Activity: Not on file  ?Stress: Not on file  ?Social Connections: Not on file  ?Intimate Partner Violence: Not on file  ? ? ? ? ?Review of Systems  ?All other systems reviewed and are negative. ? ?   ?Objective:  ? Physical Exam ?Vitals reviewed.  ?Constitutional:   ?   Appearance: Normal appearance.  ?Cardiovascular:  ?   Rate and Rhythm: Normal rate and regular rhythm.  ?   Pulses: Normal pulses.  ?   Heart sounds: Normal heart sounds. No murmur heard. ?  No friction rub. No gallop.  ?Pulmonary:  ?   Effort: Pulmonary effort is normal. No respiratory distress.  ?   Breath sounds: Normal breath sounds. No stridor. No wheezing, rhonchi or rales.  ?Abdominal:  ?   General: Abdomen is flat. Bowel sounds are normal.  ?   Palpations: Abdomen is soft.  ?Musculoskeletal:  ?   Right lower leg: No edema.  ?   Left lower leg: No edema.  ?Lymphadenopathy:  ?   Cervical: No cervical adenopathy.  ?Neurological:  ?   Mental Status: He is alert.  ? ? ? ? ? ?   ?Assessment & Plan:  ?Type 2 diabetes mellitus with other specified complication, without long-term current use of insulin (HCC) ? ?Essential hypertension ? ?Hyperlipidemia, unspecified hyperlipidemia type ? ?Idiopathic chronic gout of multiple sites without tophus ? ?Blood pressure today is slightly elevated.  I recommended the patient check his blood pressure at home over the next week and report the values to [place].  If greater than 140/90, I would recommend increasing losartan to 100 mg a day.  Hemoglobin A1c is acceptable at 6.8.  Patient is exercising and trying to lose weight and working on his diet.  Hopefully will continue to drop his sugar when we recheck it in 6 months.  LDL cholesterol is outstanding.  HDL cholesterol is low at 35.  Recommended 30 minutes to an  hour daily of aerobic exercise.  We will discontinue with Uloric and replace with allopurinol 300 mg a day due to the increased cardiovascular risk associated with Uloric.  Patient will notify me if this is ineffective. ?

## 2021-07-13 ENCOUNTER — Emergency Department (HOSPITAL_COMMUNITY): Payer: Medicaid Other

## 2021-07-13 ENCOUNTER — Other Ambulatory Visit: Payer: Self-pay

## 2021-07-13 ENCOUNTER — Emergency Department (HOSPITAL_COMMUNITY)
Admission: EM | Admit: 2021-07-13 | Discharge: 2021-07-13 | Disposition: A | Discharge status: Home / Self Care | Payer: Medicaid Other | Attending: Emergency Medicine | Admitting: Emergency Medicine

## 2021-07-13 ENCOUNTER — Encounter (HOSPITAL_COMMUNITY): Payer: Self-pay

## 2021-07-13 DIAGNOSIS — R000 Tachycardia, unspecified: Secondary | ICD-10-CM | POA: Diagnosis not present

## 2021-07-13 DIAGNOSIS — R0789 Other chest pain: Secondary | ICD-10-CM | POA: Diagnosis not present

## 2021-07-13 DIAGNOSIS — R079 Chest pain, unspecified: Secondary | ICD-10-CM

## 2021-07-13 DIAGNOSIS — X500XXA Overexertion from strenuous movement or load, initial encounter: Secondary | ICD-10-CM | POA: Insufficient documentation

## 2021-07-13 LAB — TROPONIN I (HIGH SENSITIVITY): Troponin I (High Sensitivity): 5 ng/L (ref <18)

## 2021-07-13 LAB — COMPREHENSIVE METABOLIC PANEL
ALT: 27 U/L (ref 0–44)
AST: 28 U/L (ref 15–41)
Albumin: 4.8 g/dL (ref 3.5–5.0)
Alkaline Phosphatase: 66 U/L (ref 38–126)
Anion gap: 11 (ref 5–15)
BUN: 14 mg/dL (ref 6–20)
CO2: 19 mmol/L — ABNORMAL LOW (ref 22–32)
Calcium: 9.6 mg/dL (ref 8.9–10.3)
Chloride: 107 mmol/L (ref 98–111)
Creatinine, Ser: 1.06 mg/dL (ref 0.61–1.24)
GFR, Estimated: >60 mL/min (ref >60)
Glucose, Bld: 110 mg/dL — ABNORMAL HIGH (ref 70–99)
Potassium: 3.3 mmol/L — ABNORMAL LOW (ref 3.5–5.1)
Sodium: 137 mmol/L (ref 135–145)
Total Bilirubin: 1 mg/dL (ref 0.3–1.2)
Total Protein: 8.7 g/dL — ABNORMAL HIGH (ref 6.5–8.1)

## 2021-07-13 LAB — DIFFERENTIAL
Abs Immature Granulocytes: 0.02 10*3/uL (ref 0.00–0.07)
Basophils Absolute: 0.1 10*3/uL (ref 0.0–0.1)
Basophils Relative: 1 %
Eosinophils Absolute: 0 10*3/uL (ref 0.0–0.5)
Eosinophils Relative: 0 %
Immature Granulocytes: 0 %
Lymphocytes Relative: 43 %
Lymphs Abs: 2.5 10*3/uL (ref 0.7–4.0)
Monocytes Absolute: 0.4 10*3/uL (ref 0.1–1.0)
Monocytes Relative: 7 %
Neutro Abs: 2.7 10*3/uL (ref 1.7–7.7)
Neutrophils Relative %: 49 %

## 2021-07-13 LAB — CBC
HCT: 47.3 % (ref 39.0–52.0)
Hemoglobin: 15.1 g/dL (ref 13.0–17.0)
MCH: 27.9 pg (ref 26.0–34.0)
MCHC: 31.9 g/dL (ref 30.0–36.0)
MCV: 87.3 fL (ref 80.0–100.0)
Platelets: 243 10*3/uL (ref 150–400)
RBC: 5.42 MIL/uL (ref 4.22–5.81)
RDW: 14.3 % (ref 11.5–15.5)
WBC: 5.7 10*3/uL (ref 4.0–10.5)
nRBC: 0 % (ref 0.0–0.2)

## 2021-07-13 LAB — LIPASE, BLOOD: Lipase: 28 U/L (ref 11–51)

## 2021-07-13 NOTE — ED Provider Notes (Signed)
?[facility] ?Provider Note ? ? ?CSN: [id number] ?Arrival date & time: 07/13/21  1434 ? ?  ? ?History ? ?Chief Complaint  ?Patient presents with  ? Abdominal Pain  ? ? ?ESLEY BROOKING is a 56 y.o. male. ? ? ?Abdominal Pain ?Associated symptoms: chest pain   ?Associated symptoms: no shortness of breath   ?Patient presents with a anterior chest pain.  Began yesterday.  Began after lifting some weights on the chest.  He states he had not done this in a while.  No nausea or vomiting.  No real abdominal pain.  States that he walks 2-1/2 miles every day and was able to walk today without change in the pain.  Pain is worse with certain movements.  No difficulty breathing.  No fevers.  States has been eating more healthy and is feeling better overall. ?  ? ?Home Medications ?Prior to Admission medications   ?Medication Sig Start Date End Date Taking? Authorizing Provider  ?allopurinol (ZYLOPRIM) 300 MG tablet Take 1 tablet (300 mg total) by mouth daily. Stop uloric 07/02/21   Susy Frizzle, MD  ?amLODipine (NORVASC) 10 MG tablet Take 1 tablet (10 mg total) by mouth daily. This replaces atenolol 02/15/21   Susy Frizzle, MD  ?colchicine 0.6 MG tablet Take 1 tablet (0.6 mg total) by mouth 2 (two) times daily as needed (gout). 02/15/21   Susy Frizzle, MD  ?empagliflozin (JARDIANCE) 25 MG TABS tablet Take 1 tablet (25 mg total) by mouth daily before breakfast. 02/16/21   Susy Frizzle, MD  ?Febuxostat 80 MG TABS TAKE ONE (1) TABLET BY MOUTH EVERY DAY FOR GOUT 08/15/20   Susy Frizzle, MD  ?fluticasone [facility]) 50 MCG/ACT nasal spray 2 spray each nostril daily prn allergies 01/19/20   Alycia Rossetti, MD  ?HYDROcodone-acetaminophen (NORCO) 7.5-325 MG tablet TAKE ONE (1) TABLET BY MOUTH EVERY 6 HOURS 06/13/21   Susy Frizzle, MD  ?ibuprofen (ADVIL) 800 MG tablet TAKE ONE TABLET BY MOUTH EVERY EIGHT HOURS AS NEEDED 06/13/21   Susy Frizzle, MD  ?losartan (COZAAR) 50 MG tablet TAKE ONE (1)  TABLET BY MOUTH EVERY DAY 02/15/21   Susy Frizzle, MD  ?pravastatin (PRAVACHOL) 40 MG tablet TAKE 1 TABLET (40 MG TOTAL) BY MOUTH DAILY. 07/26/20   Susy Frizzle, MD  ?sildenafil (VIAGRA) 100 MG tablet Take 0.5-1 tablets (50-100 mg total) by mouth daily as needed for erectile dysfunction. 02/15/21   Susy Frizzle, MD  ?   ? ?Allergies    ?Patient has no known allergies.   ? ?Review of Systems   ?Review of Systems  ?Constitutional:  Negative for unexpected weight change.  ?HENT:  Negative for congestion.   ?Respiratory:  Negative for shortness of breath.   ?Cardiovascular:  Positive for chest pain.  ?Gastrointestinal:  Negative for abdominal pain and blood in stool.  ?Musculoskeletal:  Negative for back pain.  ?Skin:  Negative for wound.  ?Neurological:  Negative for weakness.  ? ?Physical Exam ?Updated Vital Signs ?BP 128/86   Pulse 94   Temp 97.8 ?F (36.6 ?C) (Oral)   Resp 11   Ht 6' (1.829 m)   Wt 109.8 kg   SpO2 99%   BMI 32.82 kg/m?  ?Physical Exam ?Vitals and nursing note reviewed.  ?HENT:  ?   Head: Atraumatic.  ?Chest:  ?   Chest wall: No tenderness.  ?Abdominal:  ?   Tenderness: There is no abdominal tenderness.  ?Skin: ?  Capillary Refill: Capillary refill takes less than 2 seconds.  ?Neurological:  ?   Mental Status: He is alert.  ? ? ?ED Results / Procedures / Treatments   ?Labs ?(all labs ordered are listed, but only abnormal results are displayed) ?Labs Reviewed  ?COMPREHENSIVE METABOLIC PANEL - Abnormal; Notable for the following components:  ?    Result Value  ? Potassium 3.3 (*)   ? CO2 19 (*)   ? Glucose, Bld 110 (*)   ? Total Protein 8.7 (*)   ? All other components within normal limits  ?CBC  ?LIPASE, BLOOD  ?DIFFERENTIAL  ?TROPONIN I (HIGH SENSITIVITY)  ? ? ?EKG ?EKG Interpretation ? ?Date/Time:  Friday July 13 2021 14:42:04 EDT ?Ventricular Rate:  144 ?PR Interval:  126 ?QRS Duration: 96 ?QT Interval:  356 ?QTC Calculation: 551 ?R Axis:   95 ?Text Interpretation: Sinus  tachycardia Rightward axis Nonspecific ST abnormality Abnormal ECG When compared with ECG of 10-Sep-2019 07:11, PREVIOUS ECG IS PRESENT rate increased Confirmed by Davonna Belling [phone]) on 07/13/2021 3:11:12 PM ? ?Radiology ?DG Chest 2 View ? ?Result Date: 07/13/2021 ?CLINICAL DATA:  Chest pain EXAM: CHEST - 2 VIEW COMPARISON:  Previous studies including the examination of 09/10/2019 FINDINGS: The heart size and mediastinal contours are within normal limits. Both lungs are clear. The visualized skeletal structures are unremarkable. IMPRESSION: No active cardiopulmonary disease. Electronically Signed   By: Elmer Picker M.D.   On: 07/13/2021 15:14   ? ?Procedures ?Procedures  ? ? ?Medications Ordered in ED ?Medications - No data to display ? ?ED Course/ Medical Decision Making/ A&P ?  ?                        ?Medical Decision Making ?Amount and/or Complexity of Data Reviewed ?Labs: ordered. ?Radiology: ordered. ? ? ?Patient with anterior chest pain.  Began acutely while lifting a weight.  No exertional pain.  Initial tachycardia is improved.  EKG reassuring except the tachycardia.  Has been able to walk this morning without any change in the pain.  Troponin negative.  Chest x-ray independently interpreted and showed no pneumonia or clear cause of the pain.  Likely musculoskeletal pain.  Doubt cardiac ischemia.  Doubt pulmonary embolism.  Appears stable for discharge.  Follow-up with PCP as needed. ? ? ? ? ? ? ?Final Clinical Impression(s) / ED Diagnoses ?Final diagnoses:  ?Nonspecific chest pain  ? ? ?Rx / DC Orders ?ED Discharge Orders   ? ? None  ? ?  ? ? ?  ?Davonna Belling, MD ?07/13/21 1642 ? ?

## 2021-07-13 NOTE — ED Triage Notes (Signed)
Patient complains of epigastric pain, reports he has been drinking water with vinegar in it.  Also reports he was lifting some weights the other day and unsure if associated.  ?

## 2021-07-16 ENCOUNTER — Telehealth: Payer: Self-pay

## 2021-07-16 ENCOUNTER — Ambulatory Visit: Payer: Medicaid Other | Admitting: Family Medicine

## 2021-07-16 VITALS — BP 148/60 | HR 93 | Temp 97.2°F | Resp 15 | Ht 73.0 in | Wt 247.2 lb

## 2021-07-16 DIAGNOSIS — I10 Essential (primary) hypertension: Secondary | ICD-10-CM | POA: Diagnosis not present

## 2021-07-16 MED ORDER — LOSARTAN POTASSIUM 100 MG PO TABS: 100.0000 mg | ORAL_TABLET | Freq: Every day | ORAL | 3 refills | Status: DC

## 2021-07-16 NOTE — Progress Notes (Signed)
? ?Subjective:  ? ? Patient ID: Xavier Daniels, male    DOB: 1965-11-21, 56 y.o.   MRN: [id number] ? ?HPI ?Blood pressure remains slightly high.  In the last office visit his systolic blood pressure was 142.  Here today his systolic blood pressure is 80.  He went to the emergency room over the weekend due to the epigastric and lower chest pain in the center.  Troponins were negative.  EKG shows sinus tachycardia but he admits he was having a panic attack.  He attributes this to stress that has been under caring for his mother who is in a bad relationship.  Since that time, he has been playing basketball with his son and doing vigorous exercise with no chest pain no shortness of breath.  He denies any dyspnea on exertion.  He believes that the pain he was having was a panic attack. ?Past Medical History:  ?Diagnosis Date  ? Arthritis   ? Colon cancer screening 10/20/2018  ? Diabetes mellitus without complication ([place])   ? Gout   ? Gout   ? Hypertension   ? Substance abuse ([place])   ? 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 ?Past Surgical History:  ?Procedure Laterality Date  ? BIOPSY  04/20/2019  ? Procedure: BIOPSY;  Surgeon: Danie Binder, MD;  [facility];  Service: Endoscopy;;  transverse polyp x1  ? COLONOSCOPY WITH PROPOFOL N/A 04/20/2019  ? Procedure: COLONOSCOPY WITH PROPOFOL;  Surgeon: Danie Binder, MD;  [facility];  Service: Endoscopy;  Laterality: N/A;  9:30am-office rescheduled 04/20/19 @ 8:30am  ? POLYPECTOMY  04/20/2019  ? Procedure: POLYPECTOMY;  Surgeon: Danie Binder, MD;  [facility];  Service: Endoscopy;;  transversex1, rectal polyp  ? TOTAL HIP ARTHROPLASTY  2007  ? due to injury for avascular necrosis.  ? ?Current Outpatient Medications on File Prior to Visit  ?Medication Sig Dispense Refill  ? allopurinol (ZYLOPRIM) 300 MG tablet Take 1 tablet (300 mg total) by mouth daily. Stop uloric 30 tablet 6  ? amLODipine (NORVASC) 10 MG tablet Take 1 tablet (10 mg  total) by mouth daily. This replaces atenolol 90 tablet 3  ? colchicine 0.6 MG tablet Take 1 tablet (0.6 mg total) by mouth 2 (two) times daily as needed (gout). 30 tablet 1  ? empagliflozin (JARDIANCE) 25 MG TABS tablet Take 1 tablet (25 mg total) by mouth daily before breakfast. 30 tablet 11  ? Febuxostat 80 MG TABS TAKE ONE (1) TABLET BY MOUTH EVERY DAY FOR GOUT 90 tablet 3  ? fluticasone (FLONASE) 50 MCG/ACT nasal spray 2 spray each nostril daily prn allergies 16 g 1  ? HYDROcodone-acetaminophen (NORCO) 7.5-325 MG tablet TAKE ONE (1) TABLET BY MOUTH EVERY 6 HOURS 30 tablet 0  ? ibuprofen (ADVIL) 800 MG tablet TAKE ONE TABLET BY MOUTH EVERY EIGHT HOURS AS NEEDED 60 tablet 1  ? pravastatin (PRAVACHOL) 40 MG tablet TAKE 1 TABLET (40 MG TOTAL) BY MOUTH DAILY. 90 tablet 3  ? sildenafil (VIAGRA) 100 MG tablet Take 0.5-1 tablets (50-100 mg total) by mouth daily as needed for erectile dysfunction. 5 tablet 11  ? ?No current facility-administered medications on file prior to visit.  ? ?No Known Allergies ?Social History  ? ?Socioeconomic History  ? Marital status: Married  ?  Spouse name: Not on file  ? Number of children: Not on file  ? Years of education: Not on file  ? Highest education level: Not  on file  ?Occupational History  ? Not on file  ?Tobacco Use  ? Smoking status: Never  ? Smokeless tobacco: Never  ?Vaping Use  ? Vaping Use: Never used  ?Substance and Sexual Activity  ? Alcohol use: No  ?  Comment: previous heavy use, quit 2004  ? Drug use: No  ?  Comment: previous marijuana use  ? Sexual activity: Yes  ?  Birth control/protection: None  ?Other Topics Concern  ? Not on file  ?Social History Narrative  ? Not on file  ? ?Social Determinants of Health  ? ?Financial Resource Strain: Not on file  ?Food Insecurity: Not on file  ?Transportation Needs: Not on file  ?Physical Activity: Not on file  ?Stress: Not on file  ?Social Connections: Not on file  ?Intimate Partner Violence: Not on file  ? ? ? ? ?Review of  Systems  ?All other systems reviewed and are negative. ? ?   ?Objective:  ? Physical Exam ?Vitals reviewed.  ?Constitutional:   ?   Appearance: Normal appearance.  ?Cardiovascular:  ?   Rate and Rhythm: Normal rate and regular rhythm.  ?   Pulses: Normal pulses.  ?   Heart sounds: Normal heart sounds. No murmur heard. ?  No friction rub. No gallop.  ?Pulmonary:  ?   Effort: Pulmonary effort is normal. No respiratory distress.  ?   Breath sounds: Normal breath sounds. No stridor. No wheezing, rhonchi or rales.  ?Abdominal:  ?   General: Abdomen is flat. Bowel sounds are normal.  ?   Palpations: Abdomen is soft.  ?Musculoskeletal:  ?   Right lower leg: No edema.  ?   Left lower leg: No edema.  ?Lymphadenopathy:  ?   Cervical: No cervical adenopathy.  ?Neurological:  ?   Mental Status: He is alert.  ? ? ? ? ? ?   ?Assessment & Plan:  ?Essential hypertension ?Increase losartan to 100 mg a day and recheck blood pressure in 2 weeks ?

## 2021-07-16 NOTE — Telephone Encounter (Signed)
Transition Care Management Follow-up Telephone Call ?Date of discharge and from where: 07/13/2021 from [facility] ?How have you been since you were released from the hospital? Patient stated that he is feeling better and did not have any questions or concerns at this time.  ?Any questions or concerns? No ? ?Items Reviewed: ?Did the pt receive and understand the discharge instructions provided? Yes  ?Medications obtained and verified? Yes  ?Other? No  ?Any new allergies since your discharge? No  ?Dietary orders reviewed? No ?Do you have support at home? Yes  ? ?Functional Questionnaire: (I = Independent and D = Dependent) ?ADLs: I ? ?Bathing/Dressing- I ? ?Meal Prep- I ? ?Eating- I ? ?Maintaining continence- I ? ?Transferring/Ambulation- I ? ?Managing Meds- I ? ? ?Follow up appointments reviewed: ? ?PCP Hospital f/u appt confirmed? Yes  Scheduled to see Jenna Luo, MD on 07/16/2021 @ 12:15pm. ?[place] [facility] f/u appt confirmed? No   ?Are transportation arrangements needed? No  ?If their condition worsens, is the pt aware to call PCP or go to the Emergency Dept.? Yes ?Was the patient provided with contact information for the PCP's office or ED? Yes ?Was to pt encouraged to call back with questions or concerns? Yes ? ?

## 2021-07-27 ENCOUNTER — Other Ambulatory Visit: Payer: Self-pay | Admitting: Family Medicine

## 2021-07-27 NOTE — Telephone Encounter (Signed)
LOV 07/16/21 ?Last refill 06/13/21, #30, 0 refills ? ?Please review, thanks! ? ?

## 2021-08-06 ENCOUNTER — Telehealth: Payer: Self-pay

## 2021-08-06 NOTE — Telephone Encounter (Signed)
Pt came into office to provide bp readings for past 2 weeks: ? ?07/23/21 Mon: 137/88 ?             Tues: 133/90 ?             Wed: 140/85 ?             Thurs: 135/86 ?             Friday: 132/89 ?             Sat:  114/83 ?             Sun: 134/85 ?07/30/21 Mon: 134/88 ?             Tues: 127/87 ?             Wed: 131/90 ?             Thurs: 138/87 ?              Fri: 137/90 ?              Sat: 135/82 ?              Sun: 138/84  ?Pt stated he would like to get a cb about these readings if possible please. ? ?Cb#: [phone] ? ?

## 2021-08-07 NOTE — Telephone Encounter (Signed)
Spoke with re b/p and Dr. Samella Parr advice. Pt voiced understanding. Nothing at this time  ?

## 2021-08-14 ENCOUNTER — Other Ambulatory Visit: Payer: Self-pay | Admitting: Family Medicine

## 2021-08-14 DIAGNOSIS — E785 Hyperlipidemia, unspecified: Secondary | ICD-10-CM

## 2021-08-15 NOTE — Telephone Encounter (Signed)
Requested medication (s) are due for refill today - expired Rx ? ?Requested medication (s) are on the active medication list -yes ? ?Future visit scheduled -yes ? ?Last refill: 07/26/20 #90 3RF ? ?Notes to clinic: Request RF: expired Rx- no notes to continue in recent lab/OV ? ?Requested Prescriptions  ?Pending Prescriptions Disp Refills  ? pravastatin (PRAVACHOL) 40 MG tablet [Pharmacy Med Name: PRAVASTATIN SODIUM 40 MG TAB] 90 tablet 3  ?  Sig: TAKE 1 TABLET (40 MG TOTAL) BY MOUTH DAILY.  ?  ? Cardiovascular:  Antilipid - Statins Failed - 08/14/2021  5:03 PM  ?  ?  Failed - Lipid Panel in normal range within the last 12 months  ?  Cholesterol  ?Date Value Ref Range Status  ?06/26/2021 134 <200 mg/dL Final  ? ?LDL Cholesterol (Calc)  ?Date Value Ref Range Status  ?06/26/2021 79 mg/dL (calc) Final  ?  Comment:  ?  Reference range: <100 ?Marland Kitchen ?Desirable range <100 mg/dL for primary prevention;   ?<70 mg/dL for patients with CHD or diabetic patients  ?with > or = 2 CHD risk factors. ?. ?LDL-C is now calculated using the Martin-Hopkins  ?calculation, which is a validated novel method providing  ?better accuracy than the Friedewald equation in the  ?estimation of LDL-C.  ?Cresenciano Genre et al. Annamaria Helling. 4098;119(14): 2061-2068  ?(http://education.[facility].com/faq/FAQ164) ?  ? ?HDL  ?Date Value Ref Range Status  ?06/26/2021 35 (L) > OR = 40 mg/dL Final  ? ?Triglycerides  ?Date Value Ref Range Status  ?06/26/2021 110 <150 mg/dL Final  ? ?  ?  ?  Passed - Patient is not pregnant  ?  ?  Passed - Valid encounter within last 12 months  ?  Recent Outpatient Visits   ? ?      ? 1 month ago Essential hypertension  ? [facility], Cammie Mcgee, MD  ? 1 month ago Type 2 diabetes mellitus with other specified complication, without long-term current use of insulin ([place])  ? [facility] [place], Cammie Mcgee, MD  ? 4 months ago Strain of calf muscle, right, initial encounter  ? [facility]  [place], Cammie Mcgee, MD  ? 6 months ago Type 2 diabetes mellitus with other specified complication, without long-term current use of insulin ([place])  ? [facility] [place], Cammie Mcgee, MD  ? 9 months ago Type 2 diabetes mellitus with other specified complication, without long-term current use of insulin ([place])  ? [facility] [place], Cammie Mcgee, MD  ? ?  ?  ? ? ?  ?  ?  ? ? ? ?Requested Prescriptions  ?Pending Prescriptions Disp Refills  ? pravastatin (PRAVACHOL) 40 MG tablet [Pharmacy Med Name: PRAVASTATIN SODIUM 40 MG TAB] 90 tablet 3  ?  Sig: TAKE 1 TABLET (40 MG TOTAL) BY MOUTH DAILY.  ?  ? Cardiovascular:  Antilipid - Statins Failed - 08/14/2021  5:03 PM  ?  ?  Failed - Lipid Panel in normal range within the last 12 months  ?  Cholesterol  ?Date Value Ref Range Status  ?06/26/2021 134 <200 mg/dL Final  ? ?LDL Cholesterol (Calc)  ?Date Value Ref Range Status  ?06/26/2021 79 mg/dL (calc) Final  ?  Comment:  ?  Reference range: <100 ?Marland Kitchen ?Desirable range <100 mg/dL for primary prevention;   ?<70 mg/dL for patients with CHD or diabetic patients  ?with > or = 2 CHD risk factors. ?. ?LDL-C is now calculated using  the Martin-Hopkins  ?calculation, which is a validated novel method providing  ?better accuracy than the Friedewald equation in the  ?estimation of LDL-C.  ?Cresenciano Genre et al. Annamaria Helling. 9030;092(33): 2061-2068  ?(http://education.[facility].com/faq/FAQ164) ?  ? ?HDL  ?Date Value Ref Range Status  ?06/26/2021 35 (L) > OR = 40 mg/dL Final  ? ?Triglycerides  ?Date Value Ref Range Status  ?06/26/2021 110 <150 mg/dL Final  ? ?  ?  ?  Passed - Patient is not pregnant  ?  ?  Passed - Valid encounter within last 12 months  ?  Recent Outpatient Visits   ? ?      ? 1 month ago Essential hypertension  ? [facility] [place], Cammie Mcgee, MD  ? 1 month ago Type 2 diabetes mellitus with other specified complication, without long-term current use of insulin ([place])  ? [facility] [place], Cammie Mcgee, MD  ? 4 months ago Strain of calf muscle, right, initial encounter  ? [facility] [place], Cammie Mcgee, MD  ? 6 months ago Type 2 diabetes mellitus with other specified complication, without long-term current use of insulin ([place])  ? [facility] [place], Cammie Mcgee, MD  ? 9 months ago Type 2 diabetes mellitus with other specified complication, without long-term current use of insulin ([place])  ? [facility] [place], Cammie Mcgee, MD  ? ?  ?  ? ? ?  ?  ?  ? ? ? ?

## 2021-08-20 ENCOUNTER — Ambulatory Visit: Payer: Medicaid Other | Admitting: Cardiology

## 2021-08-21 ENCOUNTER — Other Ambulatory Visit: Payer: Self-pay | Admitting: Family Medicine

## 2021-08-22 NOTE — Telephone Encounter (Signed)
Requested medication (s) are due for refill today: yes ? ?Requested medication (s) are on the active medication list: yes ? ?Last refill:  07/27/21 #30 ? ?Future visit scheduled: yes ? ?Notes to clinic:  med not delegated to NT to RF ? ? ?Requested Prescriptions  ?Pending Prescriptions Disp Refills  ? HYDROcodone-acetaminophen (NORCO) 7.5-325 MG tablet [Pharmacy Med Name: HYDROCODONE-APAP 7.5-325 MG TAB] 30 tablet   ?  Sig: TAKE ONE (1) TABLET BY MOUTH EVERY 6 HOURS  ?  ? Not Delegated - Analgesics:  Opioid Agonist Combinations Failed - 08/21/2021  2:51 PM  ?  ?  Failed - This refill cannot be delegated  ?  ?  Failed - Urine Drug Screen completed in last 360 days  ?  ?  Passed - Valid encounter within last 3 months  ?  Recent Outpatient Visits   ? ?      ? 1 month ago Essential hypertension  ? [facility] [place], Cammie Mcgee, MD  ? 1 month ago Type 2 diabetes mellitus with other specified complication, without long-term current use of insulin ([place])  ? [facility], Cammie Mcgee, MD  ? 5 months ago Strain of calf muscle, right, initial encounter  ? [facility], Cammie Mcgee, MD  ? 6 months ago Type 2 diabetes mellitus with other specified complication, without long-term current use of insulin ([place])  ? [facility], Cammie Mcgee, MD  ? 9 months ago Type 2 diabetes mellitus with other specified complication, without long-term current use of insulin ([place])  ? [facility], Cammie Mcgee, MD  ? ?  ?  ? ? ?  ?  ?  ? ? ? ? ?

## 2021-08-22 NOTE — Telephone Encounter (Signed)
LOV 07/16/21 ?Last refill 07/27/21, #30, 0 refills ? ?Please review, thanks! ? ?

## 2021-09-03 ENCOUNTER — Ambulatory Visit: Payer: Medicaid Other | Admitting: Family Medicine

## 2021-09-03 VITALS — BP 124/82 | HR 93 | Temp 98.3°F | Wt 243.0 lb

## 2021-09-03 DIAGNOSIS — I10 Essential (primary) hypertension: Secondary | ICD-10-CM

## 2021-09-03 MED ORDER — LEVOCETIRIZINE DIHYDROCHLORIDE 5 MG PO TABS: 5.0000 mg | ORAL_TABLET | Freq: Every evening | ORAL | 5 refills | Status: AC

## 2021-09-03 NOTE — Progress Notes (Signed)
Subjective:    Patient ID: Xavier Daniels, male    DOB: 04-Apr-1966, 56 y.o.   MRN: [id number]  HPI 07/16/21 Blood pressure remains slightly high.  In the last office visit his systolic blood pressure was 142.  Here today his systolic blood pressure is 80.  He went to the emergency room over the weekend due to the epigastric and lower chest pain in the center.  Troponins were negative.  EKG shows sinus tachycardia but he admits he was having a panic attack.  He attributes this to stress that has been under caring for his mother who is in a bad relationship.  Since that time, he has been playing basketball with his son and doing vigorous exercise with no chest pain no shortness of breath.  He denies any dyspnea on exertion.  He believes that the pain he was having was a panic attack.  At that time, my plan was: Increase losartan to 100 mg a day and recheck blood pressure in 2 weeks  09/03/21 Blood pressure is outstanding today.  He states his blood pressures been running good at home.  He is also been drinking more water and avoiding sodas and exercising regularly.  I congratulated him on this.  However he reports episodic headaches in his frontal sinus.  He states that the last seconds to minutes and then resolve spontaneously.  He has been dealing with itchy watery eyes and runny nose as well.  The headaches are gradually getting better plus we come to the end of allergy season.  He denies any fevers or chills.  He also reports grinding his teeth which may be contributing to some dull achy pains in his TMJ area. Past Medical History:  Diagnosis Date   Arthritis    Colon cancer screening 10/20/2018   Diabetes mellitus without complication (HCC)    Gout    Gout    Hypertension    Substance abuse ([place])    ETOH, 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, CLEAN FOR 13 YEARS   Past Surgical History:  Procedure Laterality Date   BIOPSY  04/20/2019   Procedure: BIOPSY;  Surgeon: Danie Binder, MD;  [facility];   Service: Endoscopy;;  transverse polyp x1   COLONOSCOPY WITH PROPOFOL N/A 04/20/2019   Procedure: COLONOSCOPY WITH PROPOFOL;  Surgeon: Danie Binder, MD;  Location: [facility];  Service: Endoscopy;  Laterality: N/A;  9:30am-office rescheduled 04/20/19 @ 8:30am   POLYPECTOMY  04/20/2019   Procedure: POLYPECTOMY;  Surgeon: Danie Binder, MD;  Location: [facility];  Service: Endoscopy;;  transversex1, rectal polyp   TOTAL HIP ARTHROPLASTY  2007   due to injury for avascular necrosis.   Current Outpatient Medications on File Prior to Visit  Medication Sig Dispense Refill   allopurinol (ZYLOPRIM) 300 MG tablet Take 1 tablet (300 mg total) by mouth daily. Stop uloric 30 tablet 6   amLODipine (NORVASC) 10 MG tablet Take 1 tablet (10 mg total) by mouth daily. This replaces atenolol 90 tablet 3   colchicine 0.6 MG tablet Take 1 tablet (0.6 mg total) by mouth 2 (two) times daily as needed (gout). 30 tablet 1   empagliflozin (JARDIANCE) 25 MG TABS tablet Take 1 tablet (25 mg total) by mouth daily before breakfast. 30 tablet 11   Febuxostat 80 MG TABS TAKE ONE (1) TABLET BY MOUTH EVERY DAY FOR GOUT 90 tablet 3   fluticasone (FLONASE) 50 MCG/ACT nasal spray 2 spray each nostril daily prn allergies 16 g  1   HYDROcodone-acetaminophen (NORCO) 7.5-325 MG tablet TAKE ONE (1) TABLET BY MOUTH EVERY 6 HOURS 30 tablet 0   ibuprofen (ADVIL) 800 MG tablet TAKE ONE TABLET BY MOUTH EVERY EIGHT HOURS AS NEEDED 60 tablet 1   losartan (COZAAR) 100 MG tablet Take 1 tablet (100 mg total) by mouth daily. 90 tablet 3   pravastatin (PRAVACHOL) 40 MG tablet TAKE 1 TABLET (40 MG TOTAL) BY MOUTH DAILY. 90 tablet 3   sildenafil (VIAGRA) 100 MG tablet Take 0.5-1 tablets (50-100 mg total) by mouth daily as needed for erectile dysfunction. 5 tablet 11   No current facility-administered medications on file prior to visit.   No Known Allergies Social History   Socioeconomic History   Marital status: Married    Spouse name:  Not on file   Number of children: Not on file   Years of education: Not on file   Highest education level: Not on file  Occupational History   Not on file  Tobacco Use   Smoking status: Never   Smokeless tobacco: Never  Vaping Use   Vaping Use: Never used  Substance and Sexual Activity   Alcohol use: No    Comment: previous heavy use, quit 2004   Drug use: No    Comment: previous marijuana use   Sexual activity: Yes    Birth control/protection: None  Other Topics Concern   Not on file  Social History Narrative   Not on file   Social Determinants of Health   Financial Resource Strain: Not on file  Food Insecurity: Not on file  Transportation Needs: Not on file  Physical Activity: Not on file  Stress: Not on file  Social Connections: Not on file  Intimate Partner Violence: Not on file      Review of Systems  All other systems reviewed and are negative.     Objective:   Physical Exam Vitals reviewed.  Constitutional:      General: He is not in acute distress.    Appearance: Normal appearance. He is not ill-appearing or toxic-appearing.  HENT:     Right Ear: Tympanic membrane and ear canal normal.     Left Ear: Tympanic membrane normal.     Nose: Congestion present. No rhinorrhea.     Mouth/Throat:     Pharynx: No oropharyngeal exudate or posterior oropharyngeal erythema.  Cardiovascular:     Rate and Rhythm: Normal rate and regular rhythm.     Pulses: Normal pulses.     Heart sounds: Normal heart sounds. No murmur heard.   No friction rub. No gallop.  Pulmonary:     Effort: Pulmonary effort is normal. No respiratory distress.     Breath sounds: Normal breath sounds. No stridor. No wheezing, rhonchi or rales.  Musculoskeletal:     Right lower leg: No edema.     Left lower leg: No edema.  Lymphadenopathy:     Cervical: No cervical adenopathy.  Neurological:     Mental Status: He is alert.          Assessment & Plan:  Essential  hypertension Patient's blood pressure is outstanding.  I will make no changes in his antihypertensives.  I believe that pain that he has been developing is likely due to sinus issues.  I will give the patient prescription for Xyzal 5 mg p.o. daily as needed for sinusitis.

## 2021-10-06 ENCOUNTER — Encounter (HOSPITAL_COMMUNITY): Payer: Self-pay

## 2021-10-06 ENCOUNTER — Other Ambulatory Visit: Payer: Self-pay

## 2021-10-06 ENCOUNTER — Emergency Department (HOSPITAL_COMMUNITY)
Admission: EM | Admit: 2021-10-06 | Discharge: 2021-10-06 | Disposition: A | Discharge status: Home / Self Care | Payer: Medicaid Other | Attending: Emergency Medicine | Admitting: Emergency Medicine

## 2021-10-06 ENCOUNTER — Emergency Department (HOSPITAL_COMMUNITY): Payer: Medicaid Other

## 2021-10-06 DIAGNOSIS — E119 Type 2 diabetes mellitus without complications: Secondary | ICD-10-CM | POA: Insufficient documentation

## 2021-10-06 DIAGNOSIS — I10 Essential (primary) hypertension: Secondary | ICD-10-CM | POA: Insufficient documentation

## 2021-10-06 DIAGNOSIS — Z0389 Encounter for observation for other suspected diseases and conditions ruled out: Secondary | ICD-10-CM | POA: Diagnosis not present

## 2021-10-06 DIAGNOSIS — Z79899 Other long term (current) drug therapy: Secondary | ICD-10-CM | POA: Diagnosis not present

## 2021-10-06 DIAGNOSIS — M542 Cervicalgia: Secondary | ICD-10-CM | POA: Insufficient documentation

## 2021-10-06 DIAGNOSIS — R519 Headache, unspecified: Secondary | ICD-10-CM | POA: Diagnosis not present

## 2021-10-06 DIAGNOSIS — M5412 Radiculopathy, cervical region: Secondary | ICD-10-CM | POA: Diagnosis not present

## 2021-10-06 MED ORDER — METHOCARBAMOL 500 MG PO TABS: 500.0000 mg | ORAL_TABLET | Freq: Two times a day (BID) | ORAL | 0 refills | Status: DC

## 2021-10-06 MED ORDER — IBUPROFEN 800 MG PO TABS
800.0000 mg | ORAL_TABLET | Freq: Once | ORAL | Status: AC
  Administered 2021-10-06: 800 mg via ORAL
  Filled 2021-10-06: qty 1

## 2021-10-06 NOTE — ED Provider Notes (Signed)
[facility] EMERGENCY DEPARTMENT Provider Note   CSN: [id number] Arrival date & time: 10/06/21  1201     History  Chief Complaint  Patient presents with   Headache   Neck Pain    Xavier Daniels is a 56 y.o. male.  ac   Back Pain   Patient with medical history of diabetes, hypertension presents today due to headaches and neck pain.  He states a week ago he started having headaches are happening 3-4 times a day, they wax and wane and improve without analgesics.  Has never had headaches before, associated with pain that goes down his neck to his cervical spine.  Also on the back of his head.  No vision changes, numbness, weakness, chest pain, shortness of breath, paresthesias or fevers at home.  He denies any specific trauma to the back, states she has been seeing a masseuse she states his muscles are "tight".    Home Medications Prior to Admission medications   Medication Sig Start Date End Date Taking? Authorizing Provider  methocarbamol (ROBAXIN) 500 MG tablet Take 1 tablet (500 mg total) by mouth 2 (two) times daily. 10/06/21  Yes Sherrill Raring, PA-C  allopurinol (ZYLOPRIM) 300 MG tablet Take 1 tablet (300 mg total) by mouth daily. Stop uloric 07/02/21   Susy Frizzle, MD  amLODipine (NORVASC) 10 MG tablet Take 1 tablet (10 mg total) by mouth daily. This replaces atenolol 02/15/21   Susy Frizzle, MD  colchicine 0.6 MG tablet Take 1 tablet (0.6 mg total) by mouth 2 (two) times daily as needed (gout). 02/15/21   Susy Frizzle, MD  empagliflozin (JARDIANCE) 25 MG TABS tablet Take 1 tablet (25 mg total) by mouth daily before breakfast. 02/16/21   Susy Frizzle, MD  Febuxostat 80 MG TABS TAKE ONE (1) TABLET BY MOUTH EVERY DAY FOR GOUT 08/15/20   Susy Frizzle, MD  fluticasone [facility]) 50 MCG/ACT nasal spray 2 spray each nostril daily prn allergies 01/19/20   Alycia Rossetti, MD  HYDROcodone-acetaminophen (NORCO) 7.5-325 MG tablet TAKE ONE (1) TABLET BY MOUTH EVERY 6  HOURS 08/23/21   Susy Frizzle, MD  ibuprofen (ADVIL) 800 MG tablet TAKE ONE TABLET BY MOUTH EVERY EIGHT HOURS AS NEEDED 06/13/21   Susy Frizzle, MD  levocetirizine (XYZAL ALLERGY 24HR) 5 MG tablet Take 1 tablet (5 mg total) by mouth every evening. 09/03/21   Susy Frizzle, MD  losartan (COZAAR) 100 MG tablet Take 1 tablet (100 mg total) by mouth daily. 07/16/21   Susy Frizzle, MD  pravastatin (PRAVACHOL) 40 MG tablet TAKE 1 TABLET (40 MG TOTAL) BY MOUTH DAILY. 08/16/21   Susy Frizzle, MD  sildenafil (VIAGRA) 100 MG tablet Take 0.5-1 tablets (50-100 mg total) by mouth daily as needed for erectile dysfunction. 02/15/21   Susy Frizzle, MD      Allergies    Patient has no known allergies.    Review of Systems   Review of Systems  Musculoskeletal:  Positive for back pain.    Physical Exam Updated Vital Signs BP (!) 136/94 (BP Location: Right Arm)   Pulse 99   Temp 99 F (37.2 C) (Oral)   Resp 20   Ht 6' (1.829 m)   Wt 108 kg   SpO2 96%   BMI 32.28 kg/m  Physical Exam Vitals and nursing note reviewed. Exam conducted with a chaperone present.  Constitutional:      Appearance: Normal appearance.  HENT:  Head: Normocephalic and atraumatic.  Eyes:     General: No scleral icterus.       Right eye: No discharge.        Left eye: No discharge.     Extraocular Movements: Extraocular movements intact.     Pupils: Pupils are equal, round, and reactive to light.  Neck:     Comments: No bruits, no midline tenderness.  external and internal rotation of the cervical spine.  No point tenderness or bony tenderness Cardiovascular:     Rate and Rhythm: Normal rate and regular rhythm.     Pulses: Normal pulses.     Heart sounds: Normal heart sounds. No murmur heard.    No friction rub. No gallop.  Pulmonary:     Effort: Pulmonary effort is normal. No respiratory distress.     Breath sounds: Normal breath sounds.  Abdominal:     General: Abdomen is flat. Bowel sounds  are normal. There is no distension.     Palpations: Abdomen is soft.     Tenderness: There is no abdominal tenderness.  Musculoskeletal:        General: Tenderness present.     Cervical back: Normal range of motion. Tenderness present. No rigidity.     Comments: Neck pain and headache pain reproducible with abduction abduction of upper extremities bilaterally.  Also tender to palpation paraspinally but not specifically to the cervical spine.  No midline tenderness to thoracic or lumbar spine.  Skin:    General: Skin is warm and dry.     Coloration: Skin is not jaundiced.  Neurological:     Mental Status: He is alert. Mental status is at baseline.     Coordination: Coordination normal.     Comments: Cranial nerves III through XII are grossly intact.  Grips equal bilaterally, lower extremity strength equal bilaterally.     ED Results / Procedures / Treatments   Labs (all labs ordered are listed, but only abnormal results are displayed) Labs Reviewed - No data to display  EKG None  Radiology CT Head Wo Contrast  Result Date: 10/06/2021 CLINICAL DATA:  Cervical radiculopathy, no known injury EXAM: CT HEAD WITHOUT CONTRAST CT CERVICAL SPINE WITHOUT CONTRAST TECHNIQUE: Multidetector CT imaging of the head and cervical spine was performed following the standard protocol without intravenous contrast. Multiplanar CT image reconstructions of the cervical spine were also generated. RADIATION DOSE REDUCTION: This exam was performed according to the departmental dose-optimization program which includes automated exposure control, adjustment of the mA and/or kV according to patient size and/or use of iterative reconstruction technique. COMPARISON:  None Available. FINDINGS: CT HEAD FINDINGS Brain: No evidence of acute infarction, hemorrhage, hydrocephalus, extra-axial collection or mass lesion/mass effect. Vascular: No hyperdense vessel or unexpected calcification. Skull: Normal. Negative for fracture  or focal lesion. Sinuses/Orbits: No acute finding. Other: None. CT CERVICAL SPINE FINDINGS Alignment: Degenerative straightening and reversal of the normal cervical lordosis. Skull base and vertebrae: No acute fracture. No primary bone lesion or focal pathologic process. Soft tissues and spinal canal: No prevertebral fluid or swelling. No visible canal hematoma. Disc levels: Focally moderate disc space height loss and osteophytosis of C5-C6, with otherwise generally preserved cervical disc spaces. Upper chest: Negative. Other: None. IMPRESSION: 1. No acute intracranial pathology. 2. No fracture or static subluxation of the cervical spine. 3. Focally moderate disc space height loss and osteophytosis of C5-C6, with otherwise generally preserved cervical disc spaces. Cervical disc and neural foraminal pathology may be further evaluated by MRI if  indicated by neurologically localizing signs and symptoms. Electronically Signed   By: Delanna Ahmadi M.D.   On: 10/06/2021 14:00   CT Cervical Spine Wo Contrast  Result Date: 10/06/2021 CLINICAL DATA:  Cervical radiculopathy, no known injury EXAM: CT HEAD WITHOUT CONTRAST CT CERVICAL SPINE WITHOUT CONTRAST TECHNIQUE: Multidetector CT imaging of the head and cervical spine was performed following the standard protocol without intravenous contrast. Multiplanar CT image reconstructions of the cervical spine were also generated. RADIATION DOSE REDUCTION: This exam was performed according to the departmental dose-optimization program which includes automated exposure control, adjustment of the mA and/or kV according to patient size and/or use of iterative reconstruction technique. COMPARISON:  None Available. FINDINGS: CT HEAD FINDINGS Brain: No evidence of acute infarction, hemorrhage, hydrocephalus, extra-axial collection or mass lesion/mass effect. Vascular: No hyperdense vessel or unexpected calcification. Skull: Normal. Negative for fracture or focal lesion. Sinuses/Orbits:  No acute finding. Other: None. CT CERVICAL SPINE FINDINGS Alignment: Degenerative straightening and reversal of the normal cervical lordosis. Skull base and vertebrae: No acute fracture. No primary bone lesion or focal pathologic process. Soft tissues and spinal canal: No prevertebral fluid or swelling. No visible canal hematoma. Disc levels: Focally moderate disc space height loss and osteophytosis of C5-C6, with otherwise generally preserved cervical disc spaces. Upper chest: Negative. Other: None. IMPRESSION: 1. No acute intracranial pathology. 2. No fracture or static subluxation of the cervical spine. 3. Focally moderate disc space height loss and osteophytosis of C5-C6, with otherwise generally preserved cervical disc spaces. Cervical disc and neural foraminal pathology may be further evaluated by MRI if indicated by neurologically localizing signs and symptoms. Electronically Signed   By: Delanna Ahmadi M.D.   On: 10/06/2021 14:00    Procedures Procedures    Medications Ordered in ED Medications  ibuprofen (ADVIL) tablet 800 mg (800 mg Oral Given 10/06/21 1328)    ED Course/ Medical Decision Making/ A&P                           Medical Decision Making Amount and/or Complexity of Data Reviewed Radiology: ordered.  Risk Prescription drug management.   Patient presents due to headache and neck pain.  Differential is broad but includes muscle strain, CVA, SAH, intracranial bleed, cervical spine injury.  There are no focal deficits on neuro exam.  I do not auscultate any carotid bruits, there is no vascular component so I do not really think this is a vascular etiology for his headache or neck pain.  Is reproducible on exam making musculoskeletal see more likely.  No vision changes, no lateralized weakness or numbness on exam.  Considered stroke/TIA but ultimately feel very unlikely.   Initially discussed supportive care with muscle relaxers and anti-inflammatories.  Patient is concerned  about the headaches because he does not have any history of this and it started all of a sudden.  We will proceed with CT head given patient is over 50 to evaluate for possible intracranial mass or alternative etiology for the headaches. I ordered Motrin for the patient.    I ordered and viewed CT imaging.  No acute process, there is some height loss in his cervical spine which I do not think is an acute process.  On reevaluation headache is improved.  I think it is most likely a lesser muscle strain, will prescribe muscle relaxers and have him follow-up with PCP as needed next week.  We discussed return precautions including vision changes, numbness, lateralized weakness,  chest pain, shortness of breath or fevers.   Of note, I did review history and there is a documented history of substance use disorder but I cannot see any office visits, ED visits or documentation for this.  Patient denies to me adamantly that he has any history of substance use disorder.  His temperature is slightly elevated 99 but not febrile.  At this time he is stable and appropriate for discharge.  Document critical care time when appropriate:1}      Final Clinical Impression(s) / ED Diagnoses Final diagnoses:  Neck pain    Rx / DC Orders ED Discharge Orders          Ordered    methocarbamol (ROBAXIN) 500 MG tablet  2 times daily        10/06/21 1415              Sherrill Raring, PA-C 10/06/21 1843    Fredia Sorrow, MD 10/13/21 [phone]

## 2021-10-06 NOTE — ED Triage Notes (Signed)
Patient presents to ED c/o mid-cervical pain that radiates to bilateral shoulders x 1 week. turning his neck and movement worsens the pain. Reports no apparent injury.

## 2021-10-12 ENCOUNTER — Ambulatory Visit: Payer: Medicaid Other | Admitting: Family Medicine

## 2021-10-18 ENCOUNTER — Other Ambulatory Visit: Payer: Self-pay | Admitting: Family Medicine

## 2021-10-18 NOTE — Telephone Encounter (Signed)
Requested medication (s) are due for refill today - yes  Requested medication (s) are on the active medication list -yes  Future visit scheduled -no  Last refill: 08/23/21 #30  Notes to clinic: non delegated Rx  Requested Prescriptions  Pending Prescriptions Disp Refills   HYDROcodone-acetaminophen ([place]) 7.5-325 MG tablet [Pharmacy Med Name: HYDROCODONE-APAP 7.5-325 MG TAB] 30 tablet     Sig: TAKE ONE (1) TABLET BY MOUTH EVERY 6 HOURS     Not Delegated - Analgesics:  Opioid Agonist Combinations Failed - 10/18/2021  2:13 PM      Failed - This refill cannot be delegated      Failed - Urine Drug Screen completed in last 360 days      Passed - Valid encounter within last 3 months    Recent Outpatient Visits           1 month ago Essential hypertension   [place], Cammie Mcgee, MD   3 months ago Essential hypertension   [place], Warren T, MD   3 months ago Type 2 diabetes mellitus with other specified complication, without long-term current use of insulin ([place])   [place] Pick[facility], Cammie Mcgee, MD   7 months ago Strain of calf muscle, right, initial encounter   [place], Cammie Mcgee, MD   8 months ago Type 2 diabetes mellitus with other specified complication, without long-term current use of insulin ([place])   [place], Cammie Mcgee, MD                 Requested Prescriptions  Pending Prescriptions Disp Refills   HYDROcodone-acetaminophen ([place]) 7.5-325 MG tablet [Pharmacy Med Name: HYDROCODONE-APAP 7.5-325 MG TAB] 30 tablet     Sig: TAKE ONE (1) TABLET BY MOUTH EVERY 6 HOURS     Not Delegated - Analgesics:  Opioid Agonist Combinations Failed - 10/18/2021  2:13 PM      Failed - This refill cannot be delegated      Failed - Urine Drug Screen completed in last 360 days      Passed - Valid encounter within last 3 months    Recent Outpatient Visits            1 month ago Essential hypertension   [place], Cammie Mcgee, MD   3 months ago Essential hypertension   [place], Warren T, MD   3 months ago Type 2 diabetes mellitus with other specified complication, without long-term current use of insulin ([place])   [place], Cammie Mcgee, MD   7 months ago Strain of calf muscle, right, initial encounter   [place], Cammie Mcgee, MD   8 months ago Type 2 diabetes mellitus with other specified complication, without long-term current use of insulin ([place])   [facility], Cammie Mcgee, MD

## 2021-12-19 ENCOUNTER — Other Ambulatory Visit: Payer: Self-pay | Admitting: Family Medicine

## 2021-12-20 NOTE — Telephone Encounter (Signed)
Requested medication (s) are due for refill today - yes  Requested medication (s) are on the active medication list -yes  Future visit scheduled -no  Last refill: 11/01/21 #30  Notes to clinic: non delegated Rx  Requested Prescriptions  Pending Prescriptions Disp Refills   HYDROcodone-acetaminophen ([place]) 7.5-325 MG tablet [Pharmacy Med Name: HYDROCODONE-APAP 7.5-325 MG TAB] 30 tablet     Sig: TAKE ONE (1) TABLET BY MOUTH EVERY 6 HOURS     Not Delegated - Analgesics:  Opioid Agonist Combinations Failed - 12/19/2021  3:11 PM      Failed - This refill cannot be delegated      Failed - Urine Drug Screen completed in last 360 days      Failed - Valid encounter within last 3 months    Recent Outpatient Visits           3 months ago Essential hypertension   [place], Cammie Mcgee, MD   5 months ago Essential hypertension   [place], Warren T, MD   5 months ago Type 2 diabetes mellitus with other specified complication, without long-term current use of insulin ([place])   [place] Pickard, Cammie Mcgee, MD   9 months ago Strain of calf muscle, right, initial encounter   [place] Pickard, Cammie Mcgee, MD   10 months ago Type 2 diabetes mellitus with other specified complication, without long-term current use of insulin ([place])   [place] Pickard, Cammie Mcgee, MD                 Requested Prescriptions  Pending Prescriptions Disp Refills   HYDROcodone-acetaminophen ([place]) 7.5-325 MG tablet [Pharmacy Med Name: HYDROCODONE-APAP 7.5-325 MG TAB] 30 tablet     Sig: TAKE ONE (1) TABLET BY MOUTH EVERY 6 HOURS     Not Delegated - Analgesics:  Opioid Agonist Combinations Failed - 12/19/2021  3:11 PM      Failed - This refill cannot be delegated      Failed - Urine Drug Screen completed in last 360 days      Failed - Valid encounter within last 3 months    Recent Outpatient Visits            3 months ago Essential hypertension   [place], Cammie Mcgee, MD   5 months ago Essential hypertension   [place], Warren T, MD   5 months ago Type 2 diabetes mellitus with other specified complication, without long-term current use of insulin ([place])   [place] Pickard, Cammie Mcgee, MD   9 months ago Strain of calf muscle, right, initial encounter   [place], Cammie Mcgee, MD   10 months ago Type 2 diabetes mellitus with other specified complication, without long-term current use of insulin ([place])   [facility] Pickard, Cammie Mcgee, MD

## 2021-12-31 ENCOUNTER — Encounter: Payer: Self-pay | Admitting: Family Medicine

## 2022-01-02 ENCOUNTER — Other Ambulatory Visit: Payer: Self-pay

## 2022-01-02 ENCOUNTER — Other Ambulatory Visit: Payer: Medicaid Other

## 2022-01-02 DIAGNOSIS — E785 Hyperlipidemia, unspecified: Secondary | ICD-10-CM

## 2022-01-02 DIAGNOSIS — I10 Essential (primary) hypertension: Secondary | ICD-10-CM

## 2022-01-02 DIAGNOSIS — E1169 Type 2 diabetes mellitus with other specified complication: Secondary | ICD-10-CM

## 2022-01-03 LAB — COMPREHENSIVE METABOLIC PANEL
AG Ratio: 1.5 (calc) (ref 1.0–2.5)
ALT: 23 U/L (ref 9–46)
AST: 20 U/L (ref 10–35)
Albumin: 4.6 g/dL (ref 3.6–5.1)
Alkaline phosphatase (APISO): 109 U/L (ref 35–144)
BUN: 16 mg/dL (ref 7–25)
CO2: 25 mmol/L (ref 20–32)
Calcium: 9.9 mg/dL (ref 8.6–10.3)
Chloride: 105 mmol/L (ref 98–110)
Creat: 1.22 mg/dL (ref 0.70–1.30)
Globulin: 3.1 g/dL (calc) (ref 1.9–3.7)
Glucose, Bld: 114 mg/dL — ABNORMAL HIGH (ref 65–99)
Potassium: 4.3 mmol/L (ref 3.5–5.3)
Sodium: 138 mmol/L (ref 135–146)
Total Bilirubin: 0.7 mg/dL (ref 0.2–1.2)
Total Protein: 7.7 g/dL (ref 6.1–8.1)

## 2022-01-03 LAB — CBC WITH DIFFERENTIAL/PLATELET
Absolute Monocytes: 321 cells/uL (ref 200–950)
Basophils Absolute: 40 cells/uL (ref 0–200)
Basophils Relative: 0.9 %
Eosinophils Absolute: 119 cells/uL (ref 15–500)
Eosinophils Relative: 2.7 %
HCT: 44.6 % (ref 38.5–50.0)
Hemoglobin: 14.8 g/dL (ref 13.2–17.1)
Lymphs Abs: 1927 cells/uL (ref 850–3900)
MCH: 29.4 pg (ref 27.0–33.0)
MCHC: 33.2 g/dL (ref 32.0–36.0)
MCV: 88.7 fL (ref 80.0–100.0)
MPV: 11 fL (ref 7.5–12.5)
Monocytes Relative: 7.3 %
Neutro Abs: 1993 cells/uL (ref 1500–7800)
Neutrophils Relative %: 45.3 %
Platelets: 237 10*3/uL (ref 140–400)
RBC: 5.03 10*6/uL (ref 4.20–5.80)
RDW: 14 % (ref 11.0–15.0)
Total Lymphocyte: 43.8 %
WBC: 4.4 10*3/uL (ref 3.8–10.8)

## 2022-01-03 LAB — LIPID PANEL
Cholesterol: 143 mg/dL (ref <200)
HDL: 41 mg/dL (ref >=40)
LDL Cholesterol (Calc): 79 mg/dL (calc)
Non-HDL Cholesterol (Calc): 102 mg/dL (calc) (ref <130)
Total CHOL/HDL Ratio: 3.5 (calc) (ref <5.0)
Triglycerides: 133 mg/dL (ref <150)

## 2022-01-03 LAB — HEMOGLOBIN A1C
Hgb A1c MFr Bld: 6.6 % of total Hgb — ABNORMAL HIGH (ref <5.7)
Mean Plasma Glucose: 143 mg/dL
eAG (mmol/L): 7.9 mmol/L

## 2022-01-07 ENCOUNTER — Ambulatory Visit: Payer: Medicaid Other

## 2022-01-18 ENCOUNTER — Other Ambulatory Visit: Payer: Self-pay | Admitting: Family Medicine

## 2022-01-22 ENCOUNTER — Other Ambulatory Visit: Payer: Self-pay | Admitting: Family Medicine

## 2022-02-18 ENCOUNTER — Other Ambulatory Visit: Payer: Self-pay | Admitting: Family Medicine

## 2022-02-19 ENCOUNTER — Telehealth: Payer: Self-pay

## 2022-02-19 NOTE — Telephone Encounter (Signed)
Pt called in asking for a courtesy refill of his meds that he just requested to be refilled. Pt is scheduled for an ov on 02/22/22. Please advise  CB#: [phone]

## 2022-02-21 ENCOUNTER — Other Ambulatory Visit: Payer: Self-pay | Admitting: Family Medicine

## 2022-02-21 NOTE — Telephone Encounter (Signed)
Requested Prescriptions  Pending Prescriptions Disp Refills   JARDIANCE 25 MG TABS tablet [Pharmacy Med Name: JARDIANCE 25 MG TAB] 30 tablet 2    Sig: TAKE ONE (1) TABLET BY MOUTH EVERY DAY BEFORE BREAKFAST     Endocrinology:  Diabetes - SGLT2 Inhibitors Passed - 02/21/2022  5:51 PM      Passed - Cr in normal range and within 360 days    Creat  Date Value Ref Range Status  01/02/2022 1.22 0.70 - 1.30 mg/dL Final         Passed - HBA1C is between 0 and 7.9 and within 180 days    Hgb A1c MFr Bld  Date Value Ref Range Status  01/02/2022 6.6 (H) <5.7 % of total Hgb Final    Comment:    For someone without known diabetes, a hemoglobin A1c value of 6.5% or greater indicates that they may have  diabetes and this should be confirmed with a follow-up  test. . For someone with known diabetes, a value <7% indicates  that their diabetes is well controlled and a value  greater than or equal to 7% indicates suboptimal  control. A1c targets should be individualized based on  duration of diabetes, age, comorbid conditions, and  other considerations. . Currently, no consensus exists regarding use of hemoglobin A1c for diagnosis of diabetes for children. .          Passed - eGFR in normal range and within 360 days    GFR, Est African American  Date Value Ref Range Status  06/20/2020 80 > OR = 60 mL/min/1.86m Final   GFR, Est Non African American  Date Value Ref Range Status  06/20/2020 69 > OR = 60 mL/min/1.755mFinal   GFR, Estimated  Date Value Ref Range Status  07/13/2021 >60 >60 mL/min Final    Comment:    (NOTE) Calculated using the CKD-EPI Creatinine Equation (2021)    eGFR  Date Value Ref Range Status  10/31/2020 67 > OR = 60 mL/min/1.7311minal    Comment:    The eGFR is based on the CKD-EPI 2021 equation. To calculate  the new eGFR from a previous Creatinine or Cystatin C result, go to https://www.kidney.org/professionals/ kdoqi/gfr%5Fcalculator          Passed  - Valid encounter within last 6 months    Recent Outpatient Visits           5 months ago Essential hypertension   [place] McgeeD   7 months ago Essential hypertension   [place]ammie McgeeD   7 months ago Type 2 diabetes mellitus with other specified complication, without long-term current use of insulin ([place]ammie McgeeD   11 months ago Strain of calf muscle, right, initial encounter   [place]ammie McgeeD   1 year ago Type 2 diabetes mellitus with other specified complication, without long-term current use of insulin ([place] Bro[place]ors[place], WarCammie McgeeD       Future Appointments             [place] Pickard, WarCammie McgeeD [place]

## 2022-02-22 ENCOUNTER — Ambulatory Visit: Payer: Medicaid Other | Admitting: Family Medicine

## 2022-02-22 VITALS — BP 120/90 | HR 71 | Temp 98.7°F | Ht 73.0 in | Wt 248.0 lb

## 2022-02-22 DIAGNOSIS — E1169 Type 2 diabetes mellitus with other specified complication: Secondary | ICD-10-CM | POA: Diagnosis not present

## 2022-02-22 DIAGNOSIS — E785 Hyperlipidemia, unspecified: Secondary | ICD-10-CM | POA: Diagnosis not present

## 2022-02-22 MED ORDER — PRAVASTATIN SODIUM 40 MG PO TABS: 40.0000 mg | ORAL_TABLET | Freq: Every day | ORAL | 3 refills | Status: DC

## 2022-02-22 MED ORDER — EMPAGLIFLOZIN 25 MG PO TABS: ORAL_TABLET | ORAL | 3 refills | Status: DC

## 2022-02-22 MED ORDER — LOSARTAN POTASSIUM 100 MG PO TABS: 100.0000 mg | ORAL_TABLET | Freq: Every day | ORAL | 3 refills | Status: DC

## 2022-02-22 MED ORDER — AMLODIPINE BESYLATE 10 MG PO TABS: 10.0000 mg | ORAL_TABLET | Freq: Every day | ORAL | 3 refills | Status: DC

## 2022-02-22 NOTE — Progress Notes (Signed)
Subjective:    Patient ID: Xavier Daniels, male    DOB: [birth year], 56 y.o.   MRN: [id number]  HPI  Patient presents today with pain in his left shoulder.  Pain is located over the inferior deltoid in the middle of his arm.  He denies any pain with abduction greater than 90 degrees.  He has full and painless range of motion shoulder without any crepitus.  He has a negative empty can sign.  He has negative Hawkins sign.  He has a negative Spurling's maneuver.  There is no tenderness palpation over the deltoid.  He states the pain is getting better.  His blood pressure today is adequately controlled at 120/90.  His A1c in September was 6.6.  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, CLEAN FOR 13 YEARS   Past Surgical History:  Procedure Laterality Date   BIOPSY  04/20/2019   Procedure: BIOPSY;  Surgeon: Danie Binder, MD;  [facility];  Service: Endoscopy;;  transverse polyp x1   COLONOSCOPY WITH PROPOFOL N/A 04/20/2019   Procedure: COLONOSCOPY WITH PROPOFOL;  Surgeon: Danie Binder, MD;  Location[facility];  Service: Endoscopy;  Laterality: N/A;  9:30am-office rescheduled 04/20/19 @ 8:30am   POLYPECTOMY  04/20/2019   Procedure: POLYPECTOMY;  Surgeon: Danie Binder, MD;  Location[facility];  Service: Endoscopy;;  transversex1, rectal polyp   TOTAL HIP ARTHROPLASTY  2007   due to injury for avascular necrosis.   Current Outpatient Medications on File Prior to Visit  Medication Sig Dispense Refill   allopurinol (ZYLOPRIM) 300 MG tablet TAKE ONE TABLET ('300MG'$  TOTAL) BY MOUTH DAILY STOP ULORIC 30 tablet 6   amLODipine (NORVASC) 10 MG tablet Take 1 tablet (10 mg total) by mouth daily. This replaces atenolol 90 tablet 3   colchicine 0.6 MG tablet Take 1  tablet (0.6 mg total) by mouth 2 (two) times daily as needed (gout). 30 tablet 1   empagliflozin (JARDIANCE) 25 MG TABS tablet TAKE ONE (1) TABLET BY MOUTH EVERY DAY BEFORE BREAKFAST 30 tablet 2   Febuxostat 80 MG TABS TAKE ONE (1) TABLET BY MOUTH EVERY DAY FOR GOUT 90 tablet 3   fluticasone (FLONASE) 50 MCG/ACT nasal spray 2 spray each nostril daily prn allergies 16 g 1   HYDROcodone-acetaminophen (NORCO) 7.5-325 MG tablet TAKE ONE (1) TABLET BY MOUTH EVERY 6 HOURS 30 tablet 0   ibuprofen (ADVIL) 800 MG tablet TAKE ONE TABLET BY MOUTH EVERY EIGHT HOURS AS NEEDED 60 tablet 1   levocetirizine (XYZAL ALLERGY 24HR) 5 MG tablet Take 1 tablet (5 mg total) by mouth every evening. 30 tablet 5   losartan (COZAAR) 100 MG tablet Take 1 tablet (100 mg total) by mouth daily. 90 tablet 3   methocarbamol (ROBAXIN) 500 MG tablet Take 1 tablet (500 mg total) by mouth 2 (two) times daily. 20 tablet 0   pravastatin (PRAVACHOL) 40 MG tablet TAKE 1 TABLET (40 MG TOTAL) BY MOUTH DAILY. 90 tablet 3   sildenafil (VIAGRA) 100 MG tablet Take 0.5-1 tablets (50-100 mg total) by mouth daily as needed for erectile dysfunction. 5 tablet 11   No current facility-administered medications on file prior to visit.  No Known Allergies Social History   Socioeconomic History   Marital status: Married    Spouse name: Not on file   Number of children: Not on file   Years of education: Not on file   Highest education level: Not on file  Occupational History   Not on file  Tobacco Use   Smoking status: Never   Smokeless tobacco: Never  Vaping Use   Vaping Use: Never used  Substance and Sexual Activity   Alcohol use: No    Comment: previous heavy use, quit 2004   Drug use: No    Comment: previous marijuana use   Sexual activity: Yes    Birth control/protection: None  Other Topics Concern   Not on file  Social History Narrative   Not on file   Social Determinants of Health   Financial Resource Strain: Not on file   Food Insecurity: Not on file  Transportation Needs: Not on file  Physical Activity: Not on file  Stress: Not on file  Social Connections: Not on file  Intimate Partner Violence: Not on file      Review of Systems  All other systems reviewed and are negative.      Objective:   Physical Exam Vitals reviewed.  Constitutional:      General: He is not in acute distress.    Appearance: Normal appearance. He is not ill-appearing or toxic-appearing.  HENT:     Right Ear: Tympanic membrane and ear canal normal.     Left Ear: Tympanic membrane normal.     Mouth/Throat:     Pharynx: No oropharyngeal exudate or posterior oropharyngeal erythema.  Cardiovascular:     Rate and Rhythm: Normal rate and regular rhythm.     Pulses: Normal pulses.     Heart sounds: Normal heart sounds. No murmur heard.    No friction rub. No gallop.  Pulmonary:     Effort: Pulmonary effort is normal. No respiratory distress.     Breath sounds: Normal breath sounds. No stridor. No wheezing, rhonchi or rales.  Musculoskeletal:     Right lower leg: No edema.     Left lower leg: No edema.  Lymphadenopathy:     Cervical: No cervical adenopathy.  Neurological:     Mental Status: He is alert.           Assessment & Plan:  Type 2 diabetes mellitus with other specified complication, without long-term current use of insulin (HCC)  Hyperlipidemia, unspecified hyperlipidemia type - Plan: pravastatin (PRAVACHOL) 40 MG tablet I believe the pain in his shoulder is likely due to a deltoid strain which is gradually getting better.  No further work-up is necessary for this.  His blood pressure is acceptable.  I would like him to come in fasting for blood work in January so we can recheck his A1c along with his fasting lipid panel

## 2022-03-12 ENCOUNTER — Encounter (HOSPITAL_COMMUNITY): Payer: Self-pay | Admitting: Emergency Medicine

## 2022-03-12 ENCOUNTER — Emergency Department (HOSPITAL_COMMUNITY): Payer: Medicaid Other

## 2022-03-12 ENCOUNTER — Emergency Department (HOSPITAL_COMMUNITY)
Admission: EM | Admit: 2022-03-12 | Discharge: 2022-03-12 | Disposition: A | Discharge status: Home / Self Care | Payer: Medicaid Other | Attending: Emergency Medicine | Admitting: Emergency Medicine

## 2022-03-12 ENCOUNTER — Other Ambulatory Visit: Payer: Self-pay

## 2022-03-12 DIAGNOSIS — M25512 Pain in left shoulder: Secondary | ICD-10-CM | POA: Diagnosis present

## 2022-03-12 DIAGNOSIS — M7522 Bicipital tendinitis, left shoulder: Secondary | ICD-10-CM | POA: Diagnosis not present

## 2022-03-12 DIAGNOSIS — M779 Enthesopathy, unspecified: Secondary | ICD-10-CM

## 2022-03-12 MED ORDER — METHOCARBAMOL 500 MG PO TABS: 500.0000 mg | ORAL_TABLET | Freq: Three times a day (TID) | ORAL | 0 refills | Status: DC | PRN

## 2022-03-12 MED ORDER — KETOROLAC TROMETHAMINE 60 MG/2ML IM SOLN
60.0000 mg | Freq: Once | INTRAMUSCULAR | Status: AC
  Administered 2022-03-12: 60 mg via INTRAMUSCULAR
  Filled 2022-03-12: qty 2

## 2022-03-12 MED ORDER — CELECOXIB 200 MG PO CAPS: 200.0000 mg | ORAL_CAPSULE | Freq: Two times a day (BID) | ORAL | 0 refills | Status: DC

## 2022-03-12 NOTE — ED Provider Notes (Signed)
[facility] EMERGENCY DEPARTMENT Provider Note   CSN: [id number] Arrival date & time: 03/12/22  0423     History  Chief Complaint  Patient presents with   Shoulder Pain    Xavier Daniels is a 56 y.o. male.  Complains of left shoulder pain that has been ongoing for 2 days.  Denies any direct injury.  Seems to get worse at night when he sleeps.  Pain related to his movements at times.       Home Medications Prior to Admission medications   Medication Sig Start Date End Date Taking? Authorizing Provider  celecoxib (CELEBREX) 200 MG capsule Take 1 capsule (200 mg total) by mouth 2 (two) times daily. 03/12/22  Yes Dashiel Bergquist, Gwenyth Allegra, MD  methocarbamol (ROBAXIN) 500 MG tablet Take 1 tablet (500 mg total) by mouth every 8 (eight) hours as needed for muscle spasms. 03/12/22  Yes Liana Camerer, Gwenyth Allegra, MD  allopurinol (ZYLOPRIM) 300 MG tablet TAKE ONE TABLET ('300MG'$  TOTAL) BY MOUTH DAILY STOP ULORIC 01/22/22   Susy Frizzle, MD  amLODipine (NORVASC) 10 MG tablet Take 1 tablet (10 mg total) by mouth daily. This replaces atenolol 02/22/22   Susy Frizzle, MD  colchicine 0.6 MG tablet Take 1 tablet (0.6 mg total) by mouth 2 (two) times daily as needed (gout). 02/15/21   Susy Frizzle, MD  empagliflozin (JARDIANCE) 25 MG TABS tablet TAKE ONE (1) TABLET BY MOUTH EVERY DAY BEFORE BREAKFAST 02/22/22   Susy Frizzle, MD  Febuxostat 80 MG TABS TAKE ONE (1) TABLET BY MOUTH EVERY DAY FOR GOUT Patient not taking: Reported on 02/22/2022 08/15/20   Susy Frizzle, MD  fluticasone [facility]) 50 MCG/ACT nasal spray 2 spray each nostril daily prn allergies Patient not taking: Reported on 02/22/2022 01/19/20   Alycia Rossetti, MD  HYDROcodone-acetaminophen ([place]) 7.5-325 MG tablet TAKE ONE (1) TABLET BY MOUTH EVERY 6 HOURS 02/19/22   Susy Frizzle, MD  ibuprofen (ADVIL) 800 MG tablet TAKE ONE TABLET BY MOUTH EVERY EIGHT HOURS AS NEEDED 06/13/21   Susy Frizzle, MD   levocetirizine (XYZAL ALLERGY 24HR) 5 MG tablet Take 1 tablet (5 mg total) by mouth every evening. 09/03/21   Susy Frizzle, MD  losartan (COZAAR) 100 MG tablet Take 1 tablet (100 mg total) by mouth daily. 02/22/22   Susy Frizzle, MD  pravastatin (PRAVACHOL) 40 MG tablet Take 1 tablet (40 mg total) by mouth daily. 02/22/22   Susy Frizzle, MD  sildenafil (VIAGRA) 100 MG tablet Take 0.5-1 tablets (50-100 mg total) by mouth daily as needed for erectile dysfunction. 02/15/21   Susy Frizzle, MD      Allergies    Patient has no known allergies.    Review of Systems   Review of Systems  Physical Exam Updated Vital Signs BP (!) 143/91 (BP Location: Right Arm) Comment: Simultaneous filing. User may not have seen previous data.  Pulse 98 Comment: Simultaneous filing. User may not have seen previous data.  Temp 98.4 F (36.9 C) (Oral) Comment: Simultaneous filing. User may not have seen previous data.  Resp 18 Comment: Simultaneous filing. User may not have seen previous data.  Ht '6\' 1"'$  (1.854 m)   Wt 112 kg   SpO2 98% Comment: Simultaneous filing. User may not have seen previous data.  BMI 32.58 kg/m  Physical Exam Constitutional:      Appearance: Normal appearance.  HENT:     Head: Normocephalic.  Musculoskeletal:  Left shoulder: Tenderness present. No swelling or deformity. Normal range of motion.       Arms:     Thoracic back: Tenderness present.       Back:  Neurological:     Mental Status: He is alert.     ED Results / Procedures / Treatments   Labs (all labs ordered are listed, but only abnormal results are displayed) Labs Reviewed - No data to display  EKG None  Radiology DG Shoulder Left  Result Date: 03/12/2022 CLINICAL DATA:  56 year old male with history of left-sided shoulder pain for the past 2 days. EXAM: LEFT SHOULDER - 2+ VIEW COMPARISON:  No priors. FINDINGS: There is no evidence of fracture or dislocation. Degenerative changes of mild  osteoarthritis are noted in the acromioclavicular and glenohumeral joints. Soft tissues are unremarkable. IMPRESSION: 1. No acute radiographic abnormality of the left shoulder. Electronically Signed   By: Vinnie Langton M.D.   On: 03/12/2022 05:45    Procedures Procedures    Medications Ordered in ED Medications  ketorolac (TORADOL) injection 60 mg (60 mg Intramuscular Given 03/12/22 0609)    ED Course/ Medical Decision Making/ A&P                           Medical Decision Making Amount and/or Complexity of Data Reviewed Radiology: ordered.  Risk Prescription drug management.   Presents with nontraumatic left shoulder pain that has been ongoing for 2 days.  Patient has generalized soft tissue tenderness in the trapezius area as well as anterior shoulder.  He has retained normal range of motion, no deficit in strength.  X-ray unremarkable.  Likely tendinitis, treatment empirically, follow-up with primary care if not improving for further evaluation by orthopedics and/or physical therapy.        Final Clinical Impression(s) / ED Diagnoses Final diagnoses:  Acute pain of left shoulder  Tendinitis    Rx / DC Orders ED Discharge Orders          Ordered    celecoxib (CELEBREX) 200 MG capsule  2 times daily        03/12/22 0556    methocarbamol (ROBAXIN) 500 MG tablet  Every 8 hours PRN        03/12/22 0556              Orpah Greek, MD 03/12/22 [phone]

## 2022-03-12 NOTE — ED Triage Notes (Signed)
Pt with c/o L shoulder pain x 2 days. States he thinks he may have slept on it wrong.

## 2022-03-13 ENCOUNTER — Telehealth: Payer: Self-pay

## 2022-03-13 NOTE — Telephone Encounter (Signed)
Transition Care Management Follow-up Telephone Call Date of discharge and from where: 11/27; [facility]  How have you been since you were released from the hospital? Improving  Any questions or concerns? No  Items Reviewed: Did the pt receive and understand the discharge instructions provided? Yes  Medications obtained and verified? Yes  Other?  N/a Any new allergies since your discharge? No  Dietary orders reviewed? Yes Do you have support at home? Yes   Home Care and Equipment/Supplies: Were home health services ordered? not applicable Were any new equipment or medical supplies ordered?  No  Functional Questionnaire: (I = Independent and D = Dependent) ADLs: I  Bathing/Dressing- I  Meal Prep- I  Eating- I  Maintaining continence- I  Transferring/Ambulation- I  Managing Meds- I  Follow up appointments reviewed:  PCP Hospital f/u appt confirmed?  Declines appointment at this time  [facility] f/u appt confirmed?  N/a   Are transportation arrangements needed? No  If their condition worsens, is the pt aware to call PCP or go to the Emergency Dept.? Yes Was the patient provided with contact information for the PCP's office or ED? Yes Was to pt encouraged to call back with questions or concerns? Yes

## 2022-03-25 ENCOUNTER — Other Ambulatory Visit: Payer: Self-pay | Admitting: Family Medicine

## 2022-03-25 NOTE — Telephone Encounter (Signed)
Requested medication (s) are due for refill today - provider review   Requested medication (s) are on the active medication list -yes  Future visit scheduled -yes  Last refill: 02/19/22 #30  Notes to clinic: non delegated Rx, has notes  Requested Prescriptions  Pending Prescriptions Disp Refills   HYDROcodone-acetaminophen ([place]) 7.5-325 MG tablet [Pharmacy Med Name: HYDROCODONE-APAP 7.5-325 MG TAB] 30 tablet     Sig: TAKE ONE (1) TABLET BY MOUTH EVERY 6 HOURS     Not Delegated - Analgesics:  Opioid Agonist Combinations Failed - 03/25/2022  9:46 AM      Failed - This refill cannot be delegated      Failed - Urine Drug Screen completed in last 360 days      Failed - Valid encounter within last 3 months    Recent Outpatient Visits           6 months ago Essential hypertension   [place], Cammie Mcgee, MD   8 months ago Essential hypertension   [place], Warren T, MD   8 months ago Type 2 diabetes mellitus with other specified complication, without long-term current use of insulin ([place])   [place] Pickard, Cammie Mcgee, MD   1 year ago Strain of calf muscle, right, initial encounter   [place] Pickard, Cammie Mcgee, MD   1 year ago Type 2 diabetes mellitus with other specified complication, without long-term current use of insulin ([place])   [place] Pickard, Cammie Mcgee, MD       Future Appointments             In 1 month Pickard, Cammie Mcgee, MD [place]               Requested Prescriptions  Pending Prescriptions Disp Refills   HYDROcodone-acetaminophen ([place]) 7.5-325 MG tablet [Pharmacy Med Name: HYDROCODONE-APAP 7.5-325 MG TAB] 30 tablet     Sig: TAKE ONE (1) TABLET BY MOUTH EVERY 6 HOURS     Not Delegated - Analgesics:  Opioid Agonist Combinations Failed - 03/25/2022  9:46 AM      Failed - This refill cannot be delegated      Failed -  Urine Drug Screen completed in last 360 days      Failed - Valid encounter within last 3 months    Recent Outpatient Visits           6 months ago Essential hypertension   [place], Cammie Mcgee, MD   8 months ago Essential hypertension   [place], Warren T, MD   8 months ago Type 2 diabetes mellitus with other specified complication, without long-term current use of insulin ([place])   [place] Pickard, Cammie Mcgee, MD   1 year ago Strain of calf muscle, right, initial encounter   [place] Susy Frizzle, MD   1 year ago Type 2 diabetes mellitus with other specified complication, without long-term current use of insulin ([place])   [place] Pickard, Cammie Mcgee, MD       Future Appointments             In 1 month Pickard, Cammie Mcgee, MD [place]

## 2022-04-22 ENCOUNTER — Other Ambulatory Visit: Payer: Self-pay | Admitting: Family Medicine

## 2022-04-23 NOTE — Telephone Encounter (Signed)
Requested medication (s) are due for refill today: yes  Requested medication (s) are on the active medication list: yes    Last refill: 03/25/22  #30  0 refills  Future visit scheduled yes  05/10/22  Notes to clinic: Nor delegated, please review. Thank you.  Requested Prescriptions  Pending Prescriptions Disp Refills   HYDROcodone-acetaminophen ([place]) 7.5-325 MG tablet [Pharmacy Med Name: HYDROCODONE-APAP 7.5-325 MG TAB] 30 tablet     Sig: TAKE ONE (1) TABLET BY MOUTH EVERY 6 HOURS     Not Delegated - Analgesics:  Opioid Agonist Combinations Failed - 04/22/2022  9:09 AM      Failed - This refill cannot be delegated      Failed - Urine Drug Screen completed in last 360 days      Failed - Valid encounter within last 3 months    Recent Outpatient Visits           7 months ago Essential hypertension   [place], Cammie Mcgee, MD   9 months ago Essential hypertension   [place], Warren T, MD   9 months ago Type 2 diabetes mellitus with other specified complication, without long-term current use of insulin ([place])   [place] Pickard, Cammie Mcgee, MD   1 year ago Strain of calf muscle, right, initial encounter   [place] Susy Frizzle, MD   1 year ago Type 2 diabetes mellitus with other specified complication, without long-term current use of insulin ([place])   [place] Pickard, Cammie Mcgee, MD       Future Appointments             In 2 weeks Pickard, Cammie Mcgee, MD [place]

## 2022-05-02 ENCOUNTER — Encounter (HOSPITAL_COMMUNITY): Payer: Self-pay | Admitting: Emergency Medicine

## 2022-05-02 ENCOUNTER — Emergency Department (HOSPITAL_COMMUNITY)
Admission: EM | Admit: 2022-05-02 | Discharge: 2022-05-02 | Disposition: A | Discharge status: Home / Self Care | Payer: Medicaid Other | Attending: Emergency Medicine | Admitting: Emergency Medicine

## 2022-05-02 ENCOUNTER — Other Ambulatory Visit: Payer: Self-pay

## 2022-05-02 DIAGNOSIS — M25512 Pain in left shoulder: Secondary | ICD-10-CM | POA: Insufficient documentation

## 2022-05-02 DIAGNOSIS — R0789 Other chest pain: Secondary | ICD-10-CM | POA: Insufficient documentation

## 2022-05-02 MED ORDER — TIZANIDINE HCL 4 MG PO TABS: 4.0000 mg | ORAL_TABLET | Freq: Four times a day (QID) | ORAL | 0 refills | Status: DC | PRN

## 2022-05-02 MED ORDER — KETOROLAC TROMETHAMINE 15 MG/ML IJ SOLN
15.0000 mg | Freq: Once | INTRAMUSCULAR | Status: AC
  Administered 2022-05-02: 15 mg via INTRAMUSCULAR
  Filled 2022-05-02: qty 1

## 2022-05-02 NOTE — ED Provider Notes (Signed)
[facility] EMERGENCY DEPARTMENT Provider Note   CSN: [id number] Arrival date & time: 05/02/22  1854     History  Chief Complaint  Patient presents with   Shoulder Pain    Xavier Daniels is a 57 y.o. male.  Patient presents to the emergency department for evaluation of left shoulder, neck, and posterior shoulder pain.  Symptoms have been ongoing for several months, intermittent.  Recently worsened.  No new injuries.  Patient states that sometimes he does not have pain, but when he tilts his head back slightly, it seems to make the pain worse.  No numbness, weakness, or tingling in the arms or legs.  Symptoms occur at rest.  Not associated with exercise.  No associated diaphoresis, nausea or vomiting.  Patient has had muscle spasm in the posterior shoulder.  He also states that he has pain in the anterior portion of the axilla, points to the posterior portion of the pectoralis muscle.  He has been on tizanidine in the past for this which has helped.  More recently, he was prescribed celecoxib and methocarbamol from the ED, which she states has helped.  He started taking this medication again 2 days ago with some modest improvement.  No associated shortness of breath, cough, fever.  No history of blood clots.       Home Medications Prior to Admission medications   Medication Sig Start Date End Date Taking? Authorizing Provider  allopurinol (ZYLOPRIM) 300 MG tablet TAKE ONE TABLET ('300MG'$  TOTAL) BY MOUTH DAILY STOP ULORIC 01/22/22   Susy Frizzle, MD  amLODipine (NORVASC) 10 MG tablet Take 1 tablet (10 mg total) by mouth daily. This replaces atenolol 02/22/22   Susy Frizzle, MD  celecoxib (CELEBREX) 200 MG capsule Take 1 capsule (200 mg total) by mouth 2 (two) times daily. 03/12/22   Orpah Greek, MD  colchicine 0.6 MG tablet Take 1 tablet (0.6 mg total) by mouth 2 (two) times daily as needed (gout). 02/15/21   Susy Frizzle, MD  empagliflozin (JARDIANCE) 25 MG TABS  tablet TAKE ONE (1) TABLET BY MOUTH EVERY DAY BEFORE BREAKFAST 02/22/22   Susy Frizzle, MD  Febuxostat 80 MG TABS TAKE ONE (1) TABLET BY MOUTH EVERY DAY FOR GOUT Patient not taking: Reported on 02/22/2022 08/15/20   Susy Frizzle, MD  fluticasone [facility]) 50 MCG/ACT nasal spray 2 spray each nostril daily prn allergies Patient not taking: Reported on 02/22/2022 01/19/20   Alycia Rossetti, MD  HYDROcodone-acetaminophen ([place]) 7.5-325 MG tablet TAKE ONE (1) TABLET BY MOUTH EVERY 6 HOURS 04/23/22   Susy Frizzle, MD  ibuprofen (ADVIL) 800 MG tablet TAKE ONE TABLET BY MOUTH EVERY EIGHT HOURS AS NEEDED 06/13/21   Susy Frizzle, MD  levocetirizine (XYZAL ALLERGY 24HR) 5 MG tablet Take 1 tablet (5 mg total) by mouth every evening. 09/03/21   Susy Frizzle, MD  losartan (COZAAR) 100 MG tablet Take 1 tablet (100 mg total) by mouth daily. 02/22/22   Susy Frizzle, MD  methocarbamol (ROBAXIN) 500 MG tablet Take 1 tablet (500 mg total) by mouth every 8 (eight) hours as needed for muscle spasms. 03/12/22   Orpah Greek, MD  pravastatin (PRAVACHOL) 40 MG tablet Take 1 tablet (40 mg total) by mouth daily. 02/22/22   Susy Frizzle, MD  sildenafil (VIAGRA) 100 MG tablet Take 0.5-1 tablets (50-100 mg total) by mouth daily as needed for erectile dysfunction. 02/15/21   Susy Frizzle, MD  Allergies    Patient has no known allergies.    Review of Systems   Review of Systems  Physical Exam Updated Vital Signs BP 127/88 (BP Location: Right Arm)   Pulse 87   Temp 98.2 F (36.8 C) (Oral)   Resp 18   Ht '6\' 1"'$  (1.854 m)   Wt 115 kg   SpO2 97%   BMI 33.45 kg/m  Physical Exam Vitals and nursing note reviewed.  Constitutional:      Appearance: He is well-developed. He is not diaphoretic.  HENT:     Head: Normocephalic and atraumatic.     Mouth/Throat:     Mouth: Mucous membranes are not dry.  Eyes:     Conjunctiva/sclera: Conjunctivae normal.  Neck:     Vascular:  Normal carotid pulses. No carotid bruit or JVD.     Trachea: Trachea normal. No tracheal deviation.  Cardiovascular:     Rate and Rhythm: Normal rate and regular rhythm.     Pulses: No decreased pulses.          Radial pulses are 2+ on the right side and 2+ on the left side.     Heart sounds: Normal heart sounds, S1 normal and S2 normal. Heart sounds not distant. No murmur heard. Pulmonary:     Effort: Pulmonary effort is normal. No respiratory distress.     Breath sounds: Normal breath sounds. No wheezing.     Comments: Tenderness palpation along the lateral margin of the pectoralis muscle on the left side, in the anterior axilla. Chest:     Chest wall: Tenderness present.  Musculoskeletal:     Left shoulder: Tenderness present. Normal range of motion.     Left upper arm: No tenderness.     Cervical back: Normal range of motion and neck supple. No spasms or tenderness. No muscular tenderness. Normal range of motion.       Back:     Right lower leg: No edema.     Left lower leg: No edema.     Comments: Patient has palpable spasm over the superior portion of the left scapula.  Reproducible tenderness in this area.  Skin:    General: Skin is warm and dry.     Coloration: Skin is not pale.  Neurological:     Mental Status: He is alert. Mental status is at baseline.  Psychiatric:        Mood and Affect: Mood normal.     ED Results / Procedures / Treatments   Labs (all labs ordered are listed, but only abnormal results are displayed) Labs Reviewed - No data to display  EKG None  Radiology No results found.  Procedures Procedures    Medications Ordered in ED Medications  ketorolac (TORADOL) 15 MG/ML injection 15 mg (15 mg Intramuscular Given 05/02/22 2008)    ED Course/ Medical Decision Making/ A&P   Patient seen and examined. History obtained directly from patient.  Labs/EKG: None ordered  Imaging: None ordered  Medications/Fluids: Ordered: IM toradol   Most  recent vital signs reviewed and are as follows: BP 127/88 (BP Location: Right Arm)   Pulse 87   Temp 98.2 F (36.8 C) (Oral)   Resp 18   Ht '6\' 1"'$  (1.854 m)   Wt 115 kg   SpO2 97%   BMI 33.45 kg/m   Initial impression: Left shoulder pain.  Symptoms would be very atypical for ACS, PE, dissection.  Patient has had same symptoms in the past improve with anti-inflammatories  and muscle relaxers.  He is following up with his primary care doctor in the next 1 week.  Encouraged him to follow-up with them at that time, to discuss if orthopedic referral is needed or not.  Home treatment plan: PO tizanidine instead of methocarbamol as patient has had better improvement with this medication in the past, continue OTC or prescription NSAIDs as directed.  Return instructions discussed with patient: Return with development of chest pain, shortness of breath, constant pain.  Follow-up instructions discussed with patient: Follow-up with PCP as planned.                            Medical Decision Making Risk Prescription drug management.   Patient with left shoulder pain and focal muscle spasm.  Symptoms are reproducible.  No shortness of breath.  No signs of ACS do not suspect anginal equivalent.  Do not suspect PE or dissection.  Patient looks well, nontoxic.  The patient's vital signs, pertinent lab work and imaging were reviewed and interpreted as discussed in the ED course. Hospitalization was considered for further testing, treatments, or serial exams/observation. However as patient is well-appearing, has a stable exam, and reassuring studies today, I do not feel that they warrant admission at this time. This plan was discussed with the patient who verbalizes agreement and comfort with this plan and seems reliable and able to return to the Emergency Department with worsening or changing symptoms.          Final Clinical Impression(s) / ED Diagnoses Final diagnoses:  Acute pain of left  shoulder    Rx / DC Orders ED Discharge Orders          Ordered    tiZANidine (ZANAFLEX) 4 MG tablet  Every 6 hours PRN        05/02/22 2008              Carlisle Cater, Hershal Coria 05/02/22 2014    Cristie Hem, MD 05/03/22 [phone]

## 2022-05-02 NOTE — ED Triage Notes (Addendum)
Patient c/o pain on left side of neck, radiating to his shoulder.  Patient reports history of same, has been seen here for same.  Patient reports he was taking medication for it and the pain went away so he stopped taking the medication, and the pain has returned.

## 2022-05-02 NOTE — Discharge Instructions (Signed)
Please read and follow all provided instructions.  Your diagnoses today include:  1. Acute pain of left shoulder    Tests performed today include: Vital signs. See below for your results today.   Medications prescribed:  Tizanidine - muscle relaxer medication  DO NOT drive or perform any activities that require you to be awake and alert because this medicine can make you drowsy.   Take any prescribed medications only as directed.  Home care instructions:  Follow any educational materials contained in this packet Follow R.I.C.E. Protocol: R - rest your injury  I  - use ice on injury without applying directly to skin C - compress injury with bandage or splint E - elevate the injury as much as possible  Follow-up instructions: Please follow-up with your primary care provider if you continue to have significant pain in 1 week. In this case you may have a more severe injury that requires further care.   Return instructions:  Please return if your fingers are numb or tingling, appear gray or blue, or you have severe pain (also elevate the arm and loosen splint or wrap if you were given one) Please return to the Emergency Department if you experience worsening symptoms.  Please return if you have any other emergent concerns.  Additional Information:  Your vital signs today were: BP 127/88 (BP Location: Right Arm)   Pulse 87   Temp 98.2 F (36.8 C) (Oral)   Resp 18   Ht '6\' 1"'$  (1.854 m)   Wt 115 kg   SpO2 97%   BMI 33.45 kg/m  If your blood pressure (BP) was elevated above 135/85 this visit, please have this repeated by your doctor within one month. --------------

## 2022-05-03 ENCOUNTER — Telehealth: Payer: Self-pay

## 2022-05-03 NOTE — Telephone Encounter (Signed)
Transition Care Management Follow-up Telephone Call Date of discharge and from where: [facility]ED on 05/02/2022 Dx: Pain in right shoulder How have you been since you were released from the hospital? "I took the muscle relaxer and it eliminated the pain, but if the pain comes back severe, I will go back to ER" Any questions or concerns? No  Items Reviewed: Did the pt receive and understand the discharge instructions provided? Yes  Medications obtained and verified? Yes  Other? No  Any new allergies since your discharge? No  Dietary orders reviewed? No Do you have support at home? Yes   Home Care and Equipment/Supplies: Were home health services ordered? no If so, what is the name of the agency? N/a  Has the agency set up a time to come to the patient's home? not applicable Were any new equipment or medical supplies ordered?  No What is the name of the medical supply agency? N/a Were you able to get the supplies/equipment? no Do you have any questions related to the use of the equipment or supplies? No  Functional Questionnaire: (I = Independent and D = Dependent) ADLs: I  Bathing/Dressing- I  Meal Prep- I  Eating- I  Maintaining continence- I  Transferring/Ambulation- I  Managing Meds- I  Follow up appointments reviewed:  PCP Hospital f/u appt confirmed? Yes  Scheduled to see Dr. Jenna Luo on 05/10/2022 @ 8:15 am. Specialist Hospital f/u appt confirmed? No   Are transportation arrangements needed? No  If their condition worsens, is the pt aware to call PCP or go to the Emergency Dept.? Yes Was the patient provided with contact information for the PCP's office or ED? Yes Was to pt encouraged to call back with questions or concerns? Yes

## 2022-05-10 ENCOUNTER — Encounter: Payer: Self-pay | Admitting: Family Medicine

## 2022-05-10 ENCOUNTER — Telehealth: Payer: Self-pay

## 2022-05-10 ENCOUNTER — Ambulatory Visit: Payer: Medicaid Other | Admitting: Family Medicine

## 2022-05-10 VITALS — BP 124/76 | HR 79 | Temp 98.8°F | Ht 73.0 in | Wt 243.0 lb

## 2022-05-10 DIAGNOSIS — E1169 Type 2 diabetes mellitus with other specified complication: Secondary | ICD-10-CM | POA: Diagnosis not present

## 2022-05-10 DIAGNOSIS — M5030 Other cervical disc degeneration, unspecified cervical region: Secondary | ICD-10-CM | POA: Diagnosis not present

## 2022-05-10 MED ORDER — HYDROCODONE-ACETAMINOPHEN 7.5-325 MG PO TABS: 1.0000 | ORAL_TABLET | Freq: Four times a day (QID) | ORAL | 0 refills | Status: DC | PRN

## 2022-05-10 MED ORDER — PREDNISONE 20 MG PO TABS: ORAL_TABLET | ORAL | 0 refills | Status: DC

## 2022-05-10 NOTE — Telephone Encounter (Signed)
RF on Hydrocodone sent in today by Dr. Dennard Schaumann at pt's visit. Mjp,lpn

## 2022-05-10 NOTE — Progress Notes (Signed)
Subjective:    Patient ID: Xavier Daniels, male    DOB: [birth year], 57 y.o.   MRN: [id number]  HPI Patient was recently in the emergency room for left-sided neck pain.  The pain exits his neck and radiates into his left anterior shoulder and into his left chest.  It is a sharp deep stabbing pain.  There is certainly a radicular component to it.  He had a CT scan of the cervical spine in June which showed degenerative disc disease worse at C5-C6.  I suspect that his symptoms are due to cervical radiculopathy.  In the emergency room he was given Celebrex with minimal relief.  He is here today for follow-up.  He is also due to recheck his diabetes.  He states that his blood sugars are well-controlled and less than 130.  His blood pressure today is excellent 124/76. 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, CLEAN FOR 13 YEARS   Past Surgical History:  Procedure Laterality Date   BIOPSY  04/20/2019   Procedure: BIOPSY;  Surgeon: Danie Binder, MD;  Location: [facility];  Service: Endoscopy;;  transverse polyp x1   COLONOSCOPY WITH PROPOFOL N/A 04/20/2019   Procedure: COLONOSCOPY WITH PROPOFOL;  Surgeon: Danie Binder, MD;  Location: [facility]O [facility];  Service: Endoscopy;  Laterality: N/A;  9:30am-office rescheduled 04/20/19 @ 8:30am   POLYPECTOMY  04/20/2019   Procedure: POLYPECTOMY;  Surgeon: Danie Binder, MD;  Location: [facility];  Service: Endoscopy;;  transversex1, rectal polyp   TOTAL HIP ARTHROPLASTY  2007   due to injury for avascular necrosis.   Current Outpatient Medications on File Prior to Visit  Medication Sig Dispense Refill   allopurinol (ZYLOPRIM) 300 MG tablet TAKE ONE TABLET ('300MG'$  TOTAL) BY MOUTH DAILY STOP ULORIC 30 tablet 6   amLODipine (NORVASC) 10 MG tablet Take 1 tablet (10 mg total) by  mouth daily. This replaces atenolol 90 tablet 3   colchicine 0.6 MG tablet Take 1 tablet (0.6 mg total) by mouth 2 (two) times daily as needed (gout). 30 tablet 1   empagliflozin (JARDIANCE) 25 MG TABS tablet TAKE ONE (1) TABLET BY MOUTH EVERY DAY BEFORE BREAKFAST 90 tablet 3   ibuprofen (ADVIL) 800 MG tablet TAKE ONE TABLET BY MOUTH EVERY EIGHT HOURS AS NEEDED 60 tablet 1   levocetirizine (XYZAL ALLERGY 24HR) 5 MG tablet Take 1 tablet (5 mg total) by mouth every evening. 30 tablet 5   losartan (COZAAR) 100 MG tablet Take 1 tablet (100 mg total) by mouth daily. 90 tablet 3   pravastatin (PRAVACHOL) 40 MG tablet Take 1 tablet (40 mg total) by mouth daily. 90 tablet 3   sildenafil (VIAGRA) 100 MG tablet Take 0.5-1 tablets (50-100 mg total) by mouth daily as needed for erectile dysfunction. 5 tablet 11   tiZANidine (ZANAFLEX) 4 MG tablet Take 1 tablet (4 mg total) by mouth every 6 (six) hours as needed for muscle spasms. 15 tablet 0   No current facility-administered medications on file prior to visit.   No Known Allergies Social History   Socioeconomic History   Marital status: Married    Spouse name: Not on file   Number of children: Not on file   Years of education: Not on  file   Highest education level: Not on file  Occupational History   Not on file  Tobacco Use   Smoking status: Never   Smokeless tobacco: Never  Vaping Use   Vaping Use: Never used  Substance and Sexual Activity   Alcohol use: No    Comment: previous heavy use, quit 2004   Drug use: No    Comment: previous marijuana use   Sexual activity: Yes    Birth control/protection: None  Other Topics Concern   Not on file  Social History Narrative   Not on file   Social Determinants of Health   Financial Resource Strain: Not on file  Food Insecurity: Not on file  Transportation Needs: Not on file  Physical Activity: Not on file  Stress: Not on file  Social Connections: Not on file  Intimate Partner Violence:  Not on file      Review of Systems  All other systems reviewed and are negative.      Objective:   Physical Exam Vitals reviewed.  Constitutional:      General: He is not in acute distress.    Appearance: Normal appearance. He is not ill-appearing or toxic-appearing.  HENT:     Right Ear: Tympanic membrane and ear canal normal.     Left Ear: Tympanic membrane normal.     Mouth/Throat:     Pharynx: No oropharyngeal exudate or posterior oropharyngeal erythema.  Cardiovascular:     Rate and Rhythm: Normal rate and regular rhythm.     Pulses: Normal pulses.     Heart sounds: Normal heart sounds. No murmur heard.    No friction rub. No gallop.  Pulmonary:     Effort: Pulmonary effort is normal. No respiratory distress.     Breath sounds: Normal breath sounds. No stridor. No wheezing, rhonchi or rales.  Musculoskeletal:     Right lower leg: No edema.     Left lower leg: No edema.  Lymphadenopathy:     Cervical: No cervical adenopathy.  Neurological:     Mental Status: He is alert.           Assessment & Plan:  Type 2 diabetes mellitus with other specified complication, without long-term current use of insulin (HCC) - Plan: Hemoglobin A1c, Lipid panel, COMPLETE METABOLIC PANEL WITH GFR, Hemoglobin A1c  DDD (degenerative disc disease), cervical At this point, I believe his left shoulder pain is likely due to cervical radiculopathy from underlying cervical degenerative disc disease.  I will try the patient on a prednisone taper pack.  If not improving I would recommend orthopedic consultation for possible epidural steroid injections versus MRI.  Obtain hemoglobin A1c, lipid panel, and CMP.  Goal A1c is less than 6.5.  Blood pressure is at goal.  Goal LDL cholesterol is less than 100

## 2022-05-11 LAB — COMPLETE METABOLIC PANEL WITH GFR
AG Ratio: 1.6 (calc) (ref 1.0–2.5)
ALT: 35 U/L (ref 9–46)
AST: 18 U/L (ref 10–35)
Albumin: 4.7 g/dL (ref 3.6–5.1)
Alkaline phosphatase (APISO): 75 U/L (ref 35–144)
BUN: 13 mg/dL (ref 7–25)
CO2: 22 mmol/L (ref 20–32)
Calcium: 9.9 mg/dL (ref 8.6–10.3)
Chloride: 106 mmol/L (ref 98–110)
Creat: 1.09 mg/dL (ref 0.70–1.30)
Globulin: 2.9 g/dL (calc) (ref 1.9–3.7)
Glucose, Bld: 116 mg/dL — ABNORMAL HIGH (ref 65–99)
Potassium: 4.2 mmol/L (ref 3.5–5.3)
Sodium: 138 mmol/L (ref 135–146)
Total Bilirubin: 0.8 mg/dL (ref 0.2–1.2)
Total Protein: 7.6 g/dL (ref 6.1–8.1)
eGFR: 80 mL/min/{1.73_m2} (ref >=60)

## 2022-05-11 LAB — HEMOGLOBIN A1C
Hgb A1c MFr Bld: 6.9 % of total Hgb — ABNORMAL HIGH (ref <5.7)
Mean Plasma Glucose: 151 mg/dL
eAG (mmol/L): 8.4 mmol/L

## 2022-05-11 LAB — LIPID PANEL
Cholesterol: 129 mg/dL (ref <200)
HDL: 42 mg/dL (ref >=40)
LDL Cholesterol (Calc): 68 mg/dL (calc)
Non-HDL Cholesterol (Calc): 87 mg/dL (calc) (ref <130)
Total CHOL/HDL Ratio: 3.1 (calc) (ref <5.0)
Triglycerides: 100 mg/dL (ref <150)

## 2022-05-13 ENCOUNTER — Telehealth: Payer: Self-pay

## 2022-05-13 NOTE — Telephone Encounter (Signed)
Pt called in to request pcp to prescribe something stronger for his back spasms. Pt states that he is currently taking this med tiZANidine (ZANAFLEX) 4 MG tablet, but feels that this med is no longer working. Pt would like to know if something stronger could be sent in to pharmacy. Please advise.  Cb#:[phone]

## 2022-05-14 ENCOUNTER — Other Ambulatory Visit: Payer: Self-pay | Admitting: Family Medicine

## 2022-05-14 MED ORDER — METHOCARBAMOL 750 MG PO TABS: 750.0000 mg | ORAL_TABLET | Freq: Three times a day (TID) | ORAL | 0 refills | Status: DC | PRN

## 2022-05-22 ENCOUNTER — Other Ambulatory Visit: Payer: Self-pay | Admitting: Family Medicine

## 2022-05-23 NOTE — Telephone Encounter (Signed)
Requested medication (s) are due for refill today:   Provider to review  Requested medication (s) are on the active medication list:   Yes  Future visit scheduled:   No   Last ordered: 05/10/2022 #30, 0 refills  Returned because it's a non delegated refill    Requested Prescriptions  Pending Prescriptions Disp Refills   HYDROcodone-acetaminophen ([place]) 7.5-325 MG tablet [Pharmacy Med Name: HYDROCODONE-APAP 7.5-325 MG TAB] 30 tablet     Sig: TAKE ONE (1) TABLET BY MOUTH EVERY 6 HOURS     Not Delegated - Analgesics:  Opioid Agonist Combinations Failed - 05/22/2022  4:36 PM      Failed - This refill cannot be delegated      Failed - Urine Drug Screen completed in last 360 days      Failed - Valid encounter within last 3 months    Recent Outpatient Visits           8 months ago Essential hypertension   [place], Cammie Mcgee, MD   10 months ago Essential hypertension   [place], Cammie Mcgee, MD   10 months ago Type 2 diabetes mellitus with other specified complication, without long-term current use of insulin ([place])   [place], Cammie Mcgee, MD   1 year ago Strain of calf muscle, right, initial encounter   [place] Susy Frizzle, MD   1 year ago Type 2 diabetes mellitus with other specified complication, without long-term current use of insulin ([place])   [facility] [place], Cammie Mcgee, MD

## 2022-06-09 IMAGING — DX DG CHEST 2V
2 series · 2 of 2 positions shown · non-contrast
Comparison: Previous studies including the examination of
09/10/2019

CLINICAL DATA: Chest pain

EXAM:
CHEST - 2 VIEW

[chest pa]
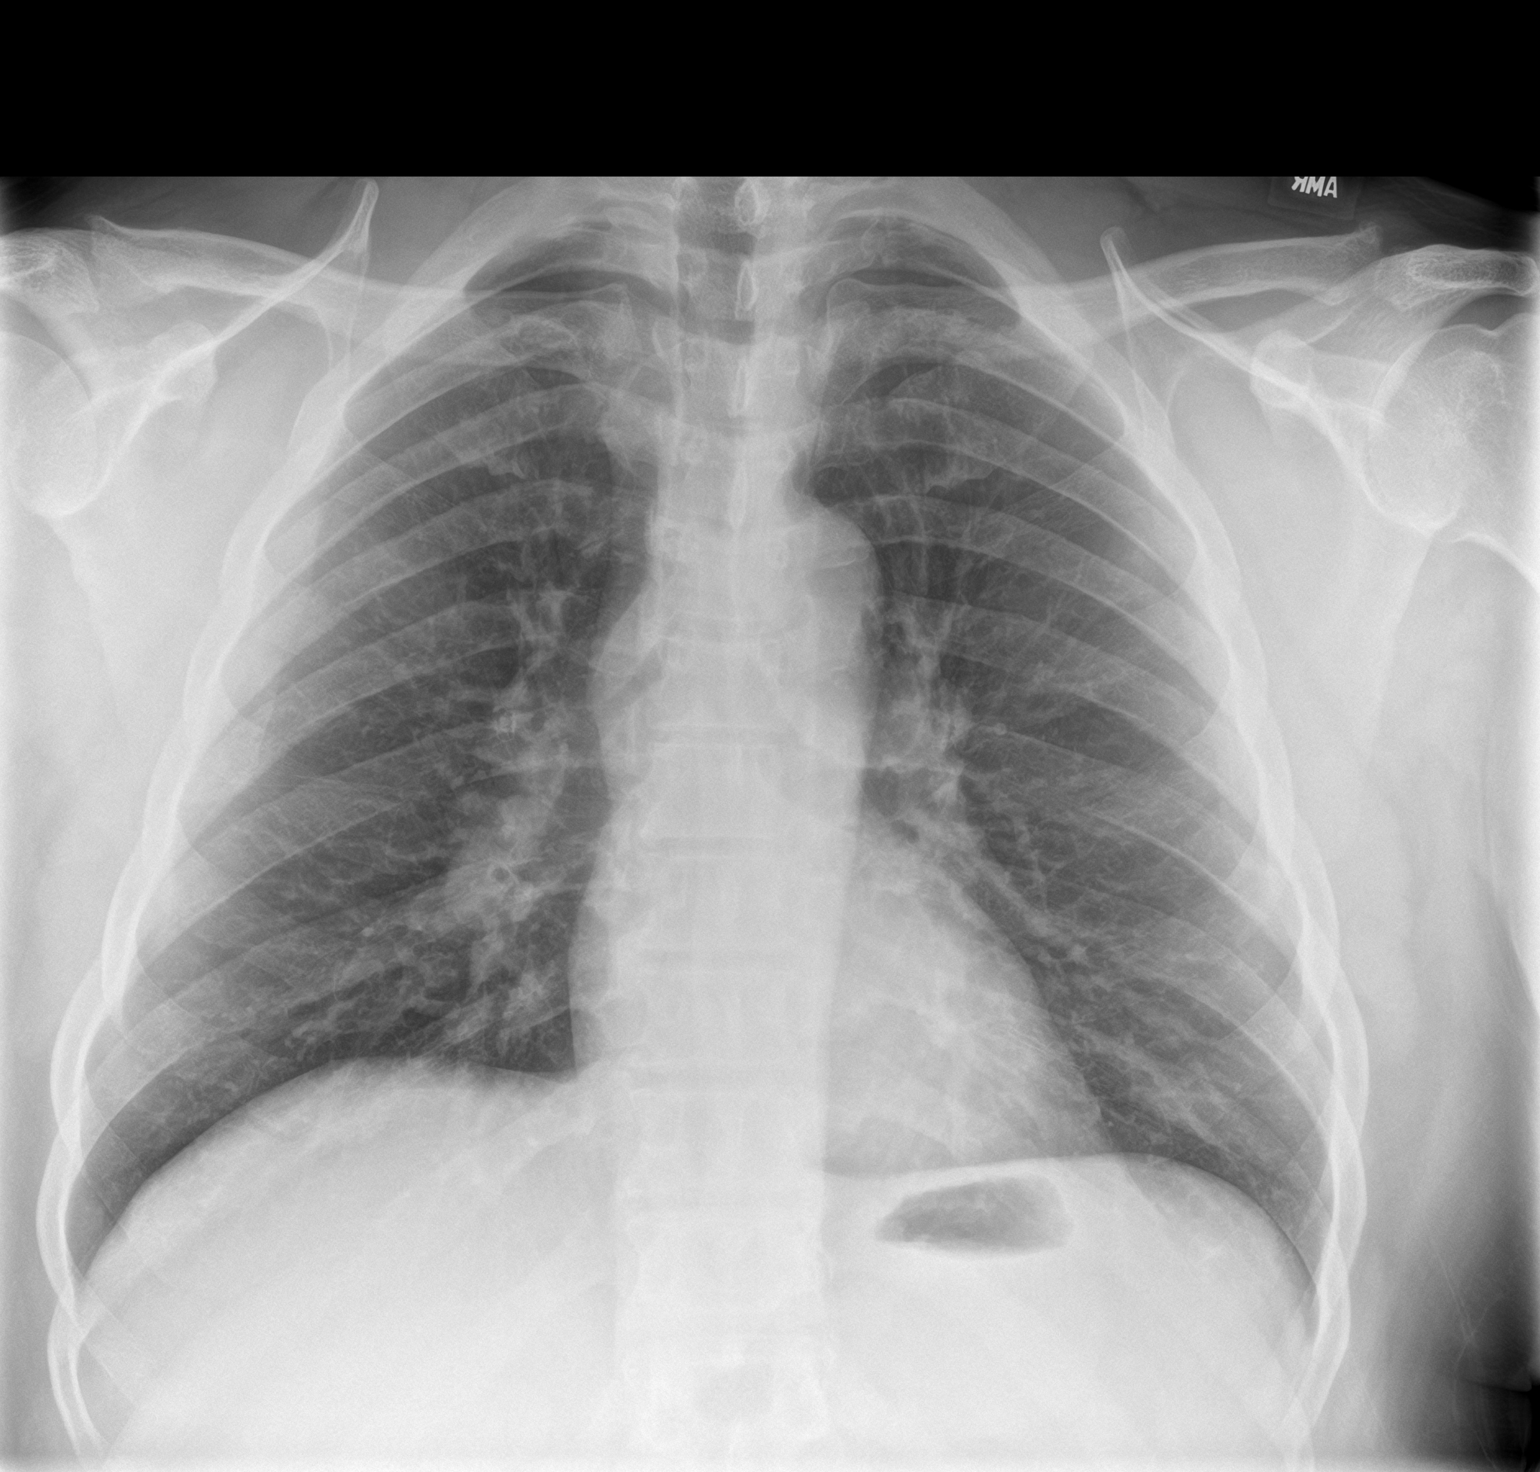

[chest lat]
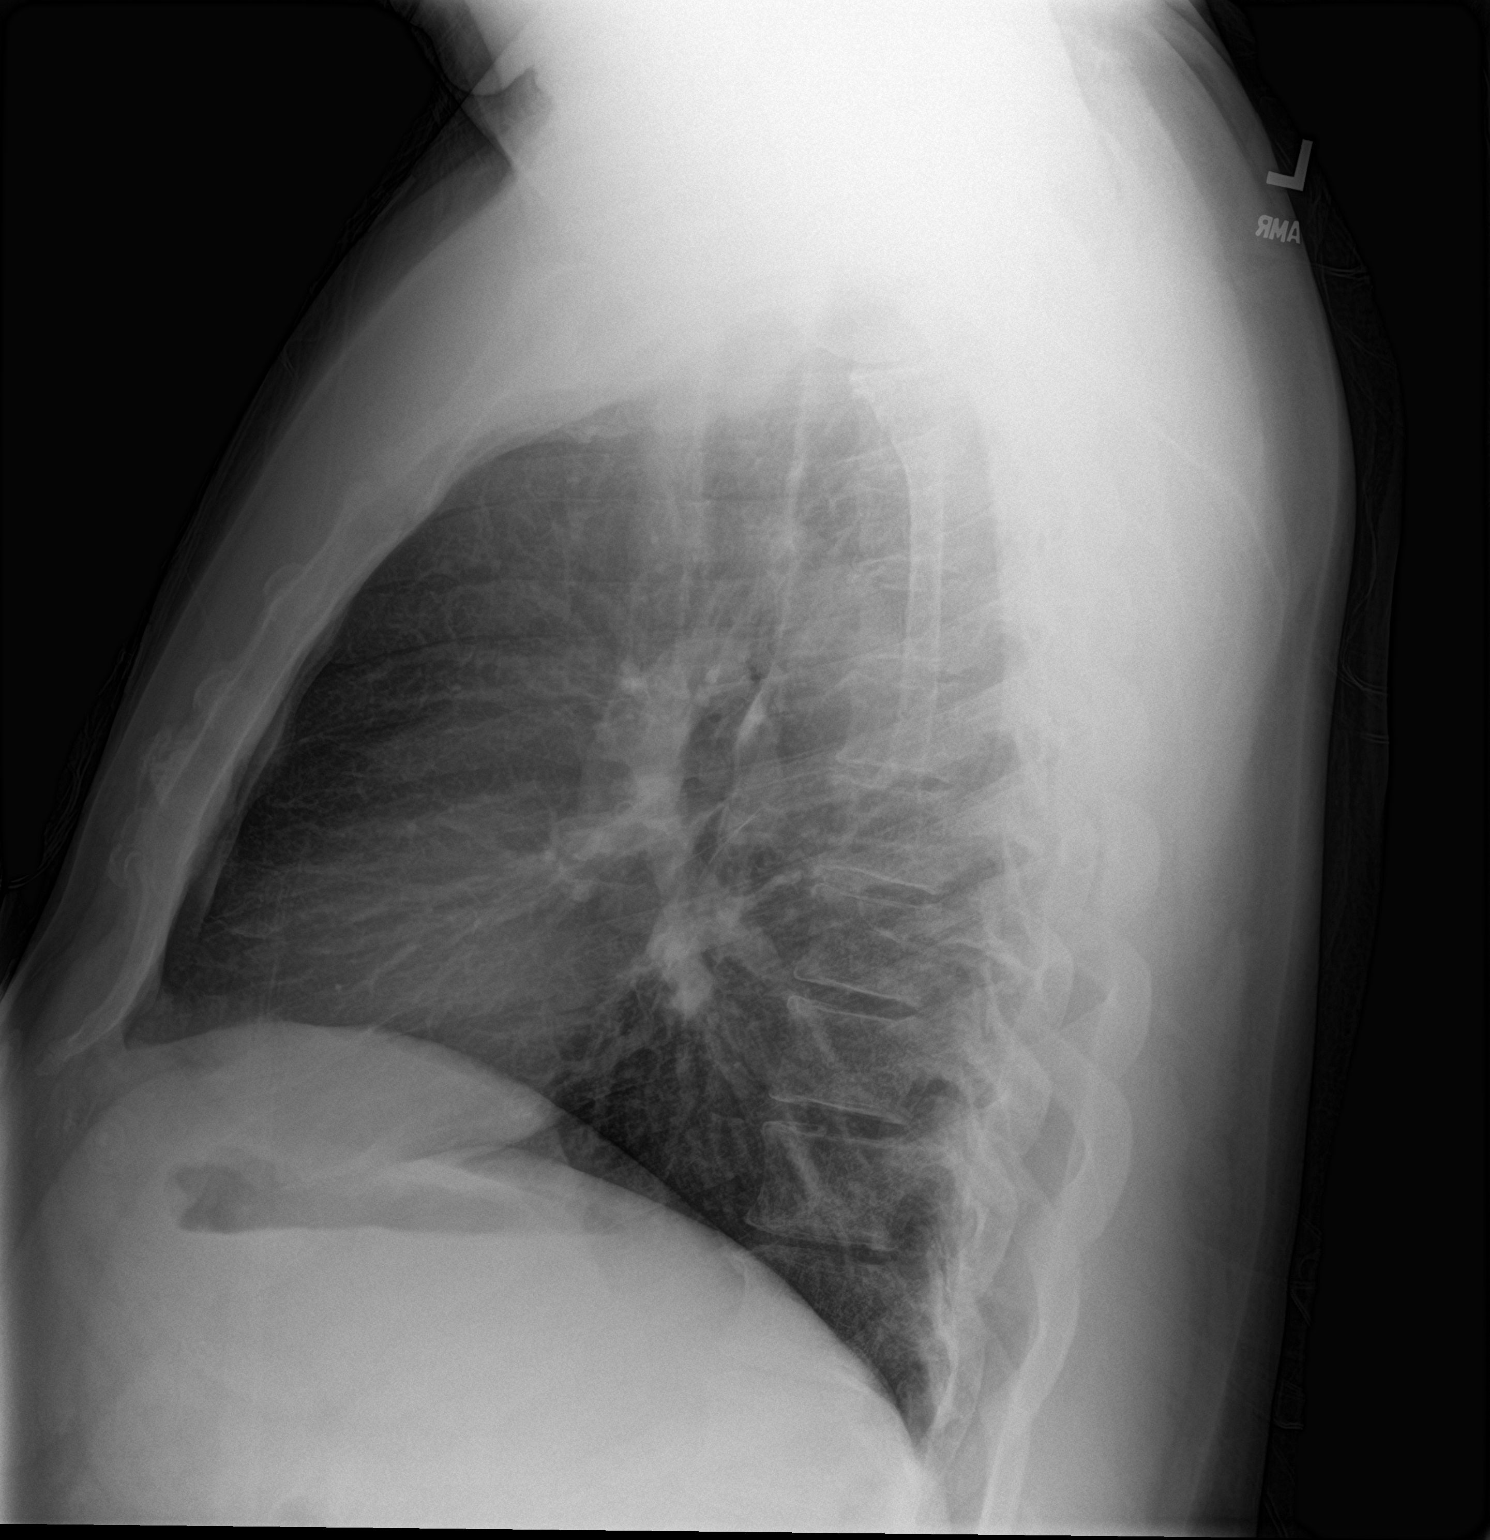

[2 of 2 positions shown; findings below may reference images not displayed]

FINDINGS: The heart size and mediastinal contours are within normal limits.
Both lungs are clear. The visualized skeletal structures are
unremarkable.
IMPRESSION: No active cardiopulmonary disease.

## 2022-06-13 ENCOUNTER — Other Ambulatory Visit: Payer: Self-pay | Admitting: Family Medicine

## 2022-06-13 ENCOUNTER — Encounter: Payer: Self-pay | Admitting: Radiology

## 2022-06-13 NOTE — Telephone Encounter (Signed)
Prescription Request  06/13/2022   LOV: 05/10/2022  What is the name of the medication or equipment?   HYDROcodone-acetaminophen ([place]) 7.5-325 MG tablet **PHARMACY REQUESTING A NEW Rx**   Have you contacted your pharmacy to request a refill? Yes   Which pharmacy would you like this sent to?  [facility], [address] [place] [address] Phone: [phone] Fax: [phone]    Patient notified that their request is being sent to the clinical staff for review and that they should receive a response within 2 business days.   Please advise pharmacist.

## 2022-06-14 ENCOUNTER — Ambulatory Visit: Payer: Medicaid Other | Admitting: Family Medicine

## 2022-06-14 ENCOUNTER — Encounter: Payer: Self-pay | Admitting: Family Medicine

## 2022-06-14 VITALS — BP 124/72 | HR 83 | Temp 97.8°F | Ht 73.0 in | Wt 251.0 lb

## 2022-06-14 DIAGNOSIS — M5030 Other cervical disc degeneration, unspecified cervical region: Secondary | ICD-10-CM

## 2022-06-14 MED ORDER — HYDROCODONE-ACETAMINOPHEN 7.5-325 MG PO TABS: ORAL_TABLET | ORAL | 0 refills | Status: DC

## 2022-06-14 NOTE — Telephone Encounter (Signed)
Pharmacy notified patient they need a paper Rx to honor refill request for  HYDROcodone-acetaminophen ([place]) 7.5-325 MG tablet [id number]   Please advise patient when Rx ready for pickup at [phone]

## 2022-06-14 NOTE — Progress Notes (Signed)
Subjective:    Patient ID: Xavier Daniels, male    DOB: Nov 27, 1965, 57 y.o.   MRN: [id number]:[id number]  Shoulder Pain   I saw the patient in January 26 and diagnosed with cervical radiculopathy.  He was given prednisone and the pain in his neck and left shoulder improved on prednisone.  He has started developing pain over the lateral aspect of his left shoulder in the deltoid.  He has no pain with abduction or internal rotation or external rotation.  There is no crepitus.  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, CLEAN FOR 13 YEARS   Past Surgical History:  Procedure Laterality Date   BIOPSY  04/20/2019   Procedure: BIOPSY;  Surgeon: Danie Binder, MD;  [facility]: [facility];  Service: Endoscopy;;  transverse polyp x1   COLONOSCOPY WITH PROPOFOL N/A 04/20/2019   Procedure: COLONOSCOPY WITH PROPOFOL;  Surgeon: Danie Binder, MD;  Location: [facility];  Service: Endoscopy;  Laterality: N/A;  9:30am-office rescheduled 04/20/19 @ 8:30am   POLYPECTOMY  04/20/2019   Procedure: POLYPECTOMY;  Surgeon: Danie Binder, MD;  Location: [facility];  Service: Endoscopy;;  transversex1, rectal polyp   TOTAL HIP ARTHROPLASTY  2007   due to injury for avascular necrosis.   Current Outpatient Medications on File Prior to Visit  Medication Sig Dispense Refill   allopurinol (ZYLOPRIM) 300 MG tablet TAKE ONE TABLET ('300MG'$  TOTAL) BY MOUTH DAILY STOP ULORIC 30 tablet 6   amLODipine (NORVASC) 10 MG tablet Take 1 tablet (10 mg total) by mouth daily. This replaces atenolol 90 tablet 3   colchicine 0.6 MG tablet Take 1 tablet (0.6 mg total) by mouth 2 (two) times daily as needed (gout). 30 tablet 1   empagliflozin (JARDIANCE) 25 MG TABS tablet TAKE ONE (1) TABLET BY MOUTH EVERY DAY  BEFORE BREAKFAST 90 tablet 3   HYDROcodone-acetaminophen (NORCO) 7.5-325 MG tablet TAKE ONE (1) TABLET BY MOUTH EVERY 6 HOURS 30 tablet 0   ibuprofen (ADVIL) 800 MG tablet TAKE ONE TABLET BY MOUTH EVERY EIGHT HOURS AS NEEDED 60 tablet 1   levocetirizine (XYZAL ALLERGY 24HR) 5 MG tablet Take 1 tablet (5 mg total) by mouth every evening. 30 tablet 5   losartan (COZAAR) 100 MG tablet Take 1 tablet (100 mg total) by mouth daily. 90 tablet 3   methocarbamol (ROBAXIN-750) 750 MG tablet Take 1 tablet (750 mg total) by mouth every 8 (eight) hours as needed for muscle spasms (discontinue tizanidine). 60 tablet 0   pravastatin (PRAVACHOL) 40 MG tablet Take 1 tablet (40 mg total) by mouth daily. 90 tablet 3   predniSONE (DELTASONE) 20 MG tablet 3 tabs poqday 1-2, 2 tabs poqday 3-4, 1 tab poqday 5-6 12 tablet 0   sildenafil (VIAGRA) 100 MG tablet Take 0.5-1 tablets (50-100 mg total) by mouth daily as needed for erectile dysfunction. 5 tablet 11   No current facility-administered medications on file prior to visit.   No Known Allergies Social History   Socioeconomic History   Marital status: Married    Spouse name: Not on file   Number of children: Not on file   Years of education: Not on file   Highest education  level: Not on file  Occupational History   Not on file  Tobacco Use   Smoking status: Never   Smokeless tobacco: Never  Vaping Use   Vaping Use: Never used  Substance and Sexual Activity   Alcohol use: No    Comment: previous heavy use, quit 2004   Drug use: No    Comment: previous marijuana use   Sexual activity: Yes    Birth control/protection: None  Other Topics Concern   Not on file  Social History Narrative   Not on file   Social Determinants of Health   Financial Resource Strain: Not on file  Food Insecurity: Not on file  Transportation Needs: Not on file  Physical Activity: Not on file  Stress: Not on file  Social Connections: Not on file  Intimate Partner Violence:  Not on file      Review of Systems  All other systems reviewed and are negative.      Objective:   Physical Exam Vitals reviewed.  Constitutional:      General: He is not in acute distress.    Appearance: Normal appearance. He is not ill-appearing or toxic-appearing.  HENT:     Right Ear: Tympanic membrane and ear canal normal.     Left Ear: Tympanic membrane normal.     Mouth/Throat:     Pharynx: No oropharyngeal exudate or posterior oropharyngeal erythema.  Cardiovascular:     Rate and Rhythm: Normal rate and regular rhythm.     Pulses: Normal pulses.     Heart sounds: Normal heart sounds. No murmur heard.    No friction rub. No gallop.  Pulmonary:     Effort: Pulmonary effort is normal. No respiratory distress.     Breath sounds: Normal breath sounds. No stridor. No wheezing, rhonchi or rales.  Musculoskeletal:     Right lower leg: No edema.     Left lower leg: No edema.  Lymphadenopathy:     Cervical: No cervical adenopathy.  Neurological:     Mental Status: He is alert.           Assessment & Plan:  DDD (degenerative disc disease), cervical  At this point, I believe his left shoulder pain is likely due to cervical radiculopathy from underlying cervical degenerative disc disease.  Patient is able to manage his pain with ibuprofen right now.  Therefore we will continue it as long as this is working.  If not improving, I would recommend an MRI of the cervical spine versus an orthopedic consultation

## 2022-07-03 ENCOUNTER — Other Ambulatory Visit: Payer: Self-pay | Admitting: Family Medicine

## 2022-07-03 DIAGNOSIS — M1A09X0 Idiopathic chronic gout, multiple sites, without tophus (tophi): Secondary | ICD-10-CM

## 2022-07-08 ENCOUNTER — Other Ambulatory Visit: Payer: Self-pay | Admitting: Family Medicine

## 2022-07-23 ENCOUNTER — Other Ambulatory Visit: Payer: Self-pay | Admitting: Family Medicine

## 2022-07-23 NOTE — Telephone Encounter (Signed)
Requested medication (s) are due for refill today - provider review   Requested medication (s) are on the active medication list -yes  Future visit scheduled -no  Last refill: 07/04/22 #30   Notes to clinic: non delegated Rx  Requested Prescriptions  Pending Prescriptions Disp Refills   HYDROcodone-acetaminophen (NORCO) 7.5-325 MG tablet [Pharmacy Med Name: HYDROCODONE-APAP 7.5-325 MG TAB] 30 tablet     Sig: TAKE 1 TABLET BY MOUTH EVERY 6 HOURS AS NEEDED     Not Delegated - Analgesics:  Opioid Agonist Combinations Failed - 07/23/2022 12:56 PM      Failed - This refill cannot be delegated      Failed - Urine Drug Screen completed in last 360 days      Failed - Valid encounter within last 3 months    Recent Outpatient Visits           10 months ago Essential hypertension   [facility], Priscille Heidelberg, MD   1 year ago Essential hypertension   [facility] Donita Brooks, MD   1 year ago Type 2 diabetes mellitus with other specified complication, without long-term current use of insulin (HCC)   [facility]ard, Priscille Heidelberg, MD   1 year ago Strain of calf muscle, right, initial encounter   [facility] Donita Brooks, MD   1 year ago Type 2 diabetes mellitus with other specified complication, without long-term current use of insulin (HCC)   [facility]ard, Priscille Heidelberg, MD                 Requested Prescriptions  Pending Prescriptions Disp Refills   HYDROcodone-acetaminophen (NORCO) 7.5-325 MG tablet [Pharmacy Med Name: HYDROCODONE-APAP 7.5-325 MG TAB] 30 tablet     Sig: TAKE 1 TABLET BY MOUTH EVERY 6 HOURS AS NEEDED     Not Delegated - Analgesics:  Opioid Agonist Combinations Failed - 07/23/2022 12:56 PM      Failed - This refill cannot be delegated      Failed - Urine Drug Screen completed in last 360 days      Failed - Valid encounter within last 3 months    Recent Outpatient  Visits           10 months ago Essential hypertension   [facility]ard, Priscille Heidelberg, MD   1 year ago Essential hypertension   [facility] Donita Brooks, MD   1 year ago Type 2 diabetes mellitus with other specified complication, without long-term current use of insulin (HCC)   [facility] Pickard, Priscille Heidelberg, MD   1 year ago Strain of calf muscle, right, initial encounter   [facility] Donita Brooks, MD   1 year ago Type 2 diabetes mellitus with other specified complication, without long-term current use of insulin (HCC)   [facility] Pickard, Priscille Heidelberg, MD

## 2022-08-06 ENCOUNTER — Telehealth: Payer: Self-pay

## 2022-08-06 ENCOUNTER — Other Ambulatory Visit: Payer: Self-pay | Admitting: Family Medicine

## 2022-08-06 DIAGNOSIS — M5030 Other cervical disc degeneration, unspecified cervical region: Secondary | ICD-10-CM

## 2022-08-06 NOTE — Telephone Encounter (Signed)
Pt called in stating that he spoke with pcp about seeing a specialist for his neck. Pt would like to know if that referral could be put in. Please advise  Cb#: [phone]

## 2022-08-12 ENCOUNTER — Other Ambulatory Visit (INDEPENDENT_AMBULATORY_CARE_PROVIDER_SITE_OTHER): Payer: Medicaid Other

## 2022-08-12 ENCOUNTER — Other Ambulatory Visit: Payer: Self-pay | Admitting: Family Medicine

## 2022-08-12 ENCOUNTER — Encounter: Payer: Self-pay | Admitting: Orthopedic Surgery

## 2022-08-12 ENCOUNTER — Ambulatory Visit: Payer: Medicaid Other | Admitting: Orthopedic Surgery

## 2022-08-12 VITALS — BP 124/84 | HR 94 | Ht 73.0 in | Wt 251.0 lb

## 2022-08-12 DIAGNOSIS — M542 Cervicalgia: Secondary | ICD-10-CM

## 2022-08-12 MED ORDER — PREGABALIN 75 MG PO CAPS: 75.0000 mg | ORAL_CAPSULE | Freq: Two times a day (BID) | ORAL | 0 refills | Status: DC

## 2022-08-12 NOTE — Progress Notes (Signed)
Orthopedic Spine Surgery Office Note  Assessment: Patient is a 57 y.o. male with neck pain that radiates into the bilateral shoulders, suspect radiculopathy.  X-ray shows disc height loss and anterior osteophyte formation at C5/6   Plan: -Explained that initially conservative treatment is tried as a significant number of patients may experience relief with these treatment modalities. Discussed that the conservative treatments include:  -activity modification  -physical therapy  -over the counter pain medications  -medrol dosepak  -cervical steroid injections -Patient has tried Tylenol, Toradol, Robaxin -Prescribed Lyrica for additional pain relief -Patient has tried conservative treatments for over 6 weeks now without any significant relief so recommended MRI of the cervical spine to evaluate for radiculopathy -Patient should return to office in 4 weeks, x-rays at next visit: none   Patient expressed understanding of the plan and all questions were answered to the patient's satisfaction.   ___________________________________________________________________________   History:  Patient is a 57 y.o. male who presents today for cervical spine.  Patient has had 3 months of neck pain that radiates into his bilateral shoulders.  He feels that goes along the lateral aspect of the shoulders.  There was no trauma or injury that preceded the onset of pain.  His left shoulder hurts more than his right side.  He has been trying conservative treatments with his primary care doctor but has not gotten any lasting relief with those treatments.  Pain does not radiate past the mid arm on either side.   Weakness: Denies Difficulty with fine motor skills (e.g., buttoning shirts, handwriting): Denies Symptoms of imbalance: Denies Paresthesias and numbness: Denies Bowel or bladder incontinence: Denies Saddle anesthesia: Denies  Treatments tried: Tylenol, Toradol, Robaxin  Review of systems: Denies  fevers and chills, night sweats, unexplained weight loss, history of cancer.  Has had pain that wakes him at night  Past medical history: Hypertension Hyperlipidemia Diabetes (last A1C was 6.9 on 05/10/2022) Gout  Allergies: NKDA  Past surgical history:  Left THA Polypectomy  Social history: Denies use of nicotine product (smoking, vaping, patches, smokeless) Alcohol use: Denies Denies recreational drug use  Physical Exam:  General: no acute distress, appears stated age Neurologic: alert, answering questions appropriately, following commands Respiratory: unlabored breathing on room air, symmetric chest rise Psychiatric: appropriate affect, normal cadence to speech   MSK (spine):  BMI of 33.1  -Strength exam      Left  Right Grip strength                5/5  5/5 Interosseus   5/5   5/5 Wrist extension  5/5  5/5 Wrist flexion   5/5  5/5 Elbow flexion   5/5  5/5 Deltoid    5/5  5/5  EHL    5/5  5/5 TA    5/5  5/5 GSC    5/5  5/5 Knee extension  5/5  5/5 Hip flexion   5/5  5/5  -Sensory exam    Sensation intact to light touch in L3-S1 nerve distributions of bilateral lower extremities  Sensation intact to light touch in C5-T1 nerve distributions of bilateral upper extremities  -Brachioradialis DTR: 2/4 on the left, 2/4 on the right -Biceps DTR: 2/4 on the left, 2/4 on the right -Achilles DTR: 2/4 on the left, 2/4 on the right -Patellar tendon DTR: 2/4 on the left, 2/4 on the right  -Spurling: Negative bilaterally -Hoffman sign: Negative bilaterally -Clonus: No beats bilaterally -Interosseous wasting: None seen -Grip and release test: Negative -Romberg: Negative -  Gait: Normal -Imbalance with tandem gait: No  Left shoulder exam: No pain through range of motion, negative Jobe, negative drop arm sign, negative belly press, negative Hawkins, no weakness with external rotation with arm at side Right shoulder exam: No pain through range of motion, negative  Hawkins, negative Jobe, negative belly press, no weakness with external rotation with arm at side  Tinel's at wrist: Negative bilaterally Phalen's at wrist: Negative bilaterally Durkan's: Negative bilaterally  Tinel's at elbow: Negative bilaterally Phalen's at elbow: Negative bilaterally  Imaging: XR of the cervical spine from 08/12/2022 was independently reviewed and interpreted, showing early anterior osteophyte formation seen at C4/5.  Disc height loss with anterior osteophyte formation at C5/6.  No fracture or dislocation seen.  No evidence of instability on flexion/extension views.   Patient name: Xavier Daniels Patient MRN: [id number] Date of visit: 08/12/22

## 2022-08-13 NOTE — Telephone Encounter (Signed)
Requested medication (s) are due for refill today - yes  Requested medication (s) are on the active medication list -yes  Future visit scheduled -no  Last refill: 10/22/22 #30  Notes to clinic: non delegated Rx  Requested Prescriptions  Pending Prescriptions Disp Refills   HYDROcodone-acetaminophen (NORCO) 7.5-325 MG tablet [Pharmacy Med Name: HYDROCODONE-APAP 7.5-325 MG TAB] 30 tablet     Sig: TAKE 1 TABLET BY MOUTH EVERY 6 HOURS AS NEEDED     Not Delegated - Analgesics:  Opioid Agonist Combinations Failed - 08/12/2022  3:39 PM      Failed - This refill cannot be delegated      Failed - Urine Drug Screen completed in last 360 days      Failed - Valid encounter within last 3 months    Recent Outpatient Visits           11 months ago Essential hypertension   [facility]ard, Priscille Heidelberg, MD   1 year ago Essential hypertension   [facility] Donita Brooks, MD   1 year ago Type 2 diabetes mellitus with other specified complication, without long-term current use of insulin (HCC)   [facility] Pickard, Priscille Heidelberg, MD   1 year ago Strain of calf muscle, right, initial encounter   [facility] Donita Brooks, MD   1 year ago Type 2 diabetes mellitus with other specified complication, without long-term current use of insulin (HCC)   [facility] Pickard, Priscille Heidelberg, MD       Future Appointments             In 1 month Christell Constant Tyrone Apple, MD [facility]               Requested Prescriptions  Pending Prescriptions Disp Refills   HYDROcodone-acetaminophen (NORCO) 7.5-325 MG tablet [Pharmacy Med Name: HYDROCODONE-APAP 7.5-325 MG TAB] 30 tablet     Sig: TAKE 1 TABLET BY MOUTH EVERY 6 HOURS AS NEEDED     Not Delegated - Analgesics:  Opioid Agonist Combinations Failed - 08/12/2022  3:39 PM      Failed - This refill cannot be delegated      Failed - Urine Drug Screen  completed in last 360 days      Failed - Valid encounter within last 3 months    Recent Outpatient Visits           11 months ago Essential hypertension   [facility] Pickard, Priscille Heidelberg, MD   1 year ago Essential hypertension   [facility] Donita Brooks, MD   1 year ago Type 2 diabetes mellitus with other specified complication, without long-term current use of insulin (HCC)   [facility] Pickard, Priscille Heidelberg, MD   1 year ago Strain of calf muscle, right, initial encounter   [facility] Donita Brooks, MD   1 year ago Type 2 diabetes mellitus with other specified complication, without long-term current use of insulin (HCC)   [facility] Pickard, Priscille Heidelberg, MD       Future Appointments             In 1 month Christell Constant Tyrone Apple, MD [facility]

## 2022-08-23 ENCOUNTER — Other Ambulatory Visit: Payer: Self-pay | Admitting: Family Medicine

## 2022-08-23 NOTE — Telephone Encounter (Signed)
Requested medication (s) are due for refill today: routing for review  Requested medication (s) are on the active medication list: yes  Last refill:  08/13/22  Future visit scheduled: yes  Notes to clinic:  Unable to refill per protocol, cannot delegate.      Requested Prescriptions  Pending Prescriptions Disp Refills   HYDROcodone-acetaminophen (NORCO) 7.5-325 MG tablet [Pharmacy Med Name: HYDROCODONE-APAP 7.5-325 MG TAB] 30 tablet     Sig: TAKE 1 TABLET BY MOUTH EVERY 6 HOURS AS NEEDED     Not Delegated - Analgesics:  Opioid Agonist Combinations Failed - 08/23/2022  1:16 PM      Failed - This refill cannot be delegated      Failed - Urine Drug Screen completed in last 360 days      Failed - Valid encounter within last 3 months    Recent Outpatient Visits           11 months ago Essential hypertension   [facility] Pickard, Priscille Heidelberg, MD   1 year ago Essential hypertension   [facility] Donita Brooks, MD   1 year ago Type 2 diabetes mellitus with other specified complication, without long-term current use of insulin (HCC)   [facility] Pickard, Priscille Heidelberg, MD   1 year ago Strain of calf muscle, right, initial encounter   [facility] Donita Brooks, MD   1 year ago Type 2 diabetes mellitus with other specified complication, without long-term current use of insulin (HCC)   [facility] Pickard, Priscille Heidelberg, MD       Future Appointments             In 2 weeks Christell Constant Tyrone Apple, MD [facility]

## 2022-08-26 ENCOUNTER — Other Ambulatory Visit: Payer: Self-pay | Admitting: Family Medicine

## 2022-08-27 NOTE — Telephone Encounter (Signed)
Requested medication (s) are due for refill today: yes  Requested medication (s) are on the active medication list: yes  Last refill:  08/13/22 #30  Future visit scheduled: no  Notes to clinic:  med not delegated to NT to RF   Requested Prescriptions  Pending Prescriptions Disp Refills   HYDROcodone-acetaminophen (NORCO) 7.5-325 MG tablet [Pharmacy Med Name: HYDROCODONE-APAP 7.5-325 MG TAB] 30 tablet     Sig: TAKE 1 TABLET BY MOUTH EVERY 6 HOURS AS NEEDED     Not Delegated - Analgesics:  Opioid Agonist Combinations Failed - 08/26/2022 11:52 AM      Failed - This refill cannot be delegated      Failed - Urine Drug Screen completed in last 360 days      Failed - Valid encounter within last 3 months    Recent Outpatient Visits           11 months ago Essential hypertension   [facility] Pickard, Priscille Heidelberg, MD   1 year ago Essential hypertension   [facility] Donita Brooks, MD   1 year ago Type 2 diabetes mellitus with other specified complication, without long-term current use of insulin (HCC)   [facility] Pickard, Priscille Heidelberg, MD   1 year ago Strain of calf muscle, right, initial encounter   [facility] Donita Brooks, MD   1 year ago Type 2 diabetes mellitus with other specified complication, without long-term current use of insulin (HCC)   [facility] Pickard, Priscille Heidelberg, MD       Future Appointments             In 2 weeks Christell Constant Tyrone Apple, MD [facility]

## 2022-08-28 ENCOUNTER — Telehealth: Payer: Self-pay

## 2022-08-28 NOTE — Telephone Encounter (Signed)
Pt called requesting a refill on his Hydrocodone 7.5 mg-325 mg. Last RF was 08/13/2022. Last OV was 08/06/2022. Thanks.

## 2022-09-09 ENCOUNTER — Ambulatory Visit
Admission: EM | Admit: 2022-09-09 | Discharge: 2022-09-09 | Disposition: A | Discharge status: Home / Self Care | Payer: Medicaid Other | Attending: Nurse Practitioner | Admitting: Nurse Practitioner

## 2022-09-09 ENCOUNTER — Other Ambulatory Visit: Payer: Self-pay

## 2022-09-09 ENCOUNTER — Encounter: Payer: Self-pay | Admitting: Emergency Medicine

## 2022-09-09 DIAGNOSIS — M545 Low back pain, unspecified: Secondary | ICD-10-CM

## 2022-09-09 MED ORDER — KETOROLAC TROMETHAMINE 60 MG/2ML IM SOLN
60.0000 mg | Freq: Once | INTRAMUSCULAR | Status: AC
  Administered 2022-09-09: 60 mg via INTRAMUSCULAR

## 2022-09-09 MED ORDER — KETOROLAC TROMETHAMINE 30 MG/ML IJ SOLN: 60.0000 mg | Freq: Once | INTRAMUSCULAR | Status: DC

## 2022-09-09 NOTE — ED Provider Notes (Signed)
[facility]    CSN: [id number] Arrival date & time: 09/09/22  1233      History   Chief Complaint Chief Complaint  Patient presents with   Back Pain    HPI Xavier Daniels is a 57 y.o. male.   Patient presents today for right low back pain that began a few days ago.  No recent fall, trauma, or injury to the back.  Reports the pain started after he twisted the wrong way.  The pain is sharp and moderate to severe.  The pain does not radiate down either leg or to the buttock.  No numbness or tingling going down the leg, weakness in lower extremities, decreased sensation in lower extremities, saddle anesthesia, new bowel or bladder incontinence, fever, nausea/vomiting, dysuria/urinary frequency, and hematuria since the pain began.  Has taken ibuprofen, muscle relaxant, previously prescribed pain medication without improvement.    Past Medical History:  Diagnosis Date   Arthritis    Colon cancer screening 10/20/2018   Diabetes mellitus without complication (HCC)    Gout    Gout    Hypertension    Substance abuse (HCC)    ETOH, 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, CLEAN FOR 13 YEARS    Patient Active Problem List   Diagnosis Date Noted   Chronic pain of left knee 12/14/2019   Depression, major, single episode, mild (HCC) 12/14/2019   Left ankle pain 12/14/2019   T2DM (type 2 diabetes mellitus) (HCC) 09/10/2019   Essential hypertension 06/06/2014   Hyperlipidemia 06/06/2014   Gout 06/06/2014   Obesity 06/06/2014    Past Surgical History:  Procedure Laterality Date   BIOPSY  04/20/2019   Procedure: BIOPSY;  Surgeon: West Bali, MD;  Location: [facility];  Service: Endoscopy;;  transverse polyp x1   COLONOSCOPY WITH PROPOFOL N/A 04/20/2019   Procedure: COLONOSCOPY WITH PROPOFOL;  Surgeon: West Bali, MD;  Location: [facility];  Service: Endoscopy;  Laterality: N/A;  9:30am-office rescheduled 04/20/19 @ 8:30am   POLYPECTOMY  04/20/2019   Procedure: POLYPECTOMY;   Surgeon: West Bali, MD;  Location: [facility];  Service: Endoscopy;;  transversex1, rectal polyp   TOTAL HIP ARTHROPLASTY  2007   due to injury for avascular necrosis.       Home Medications    Prior to Admission medications   Medication Sig Start Date End Date Taking? Authorizing Provider  allopurinol (ZYLOPRIM) 300 MG tablet TAKE ONE TABLET (300MG  TOTAL) BY MOUTH DAILY STOP ULORIC 01/22/22   Donita Brooks, MD  amLODipine (NORVASC) 10 MG tablet Take 1 tablet (10 mg total) by mouth daily. This replaces atenolol 02/22/22   Donita Brooks, MD  colchicine 0.6 MG tablet Take 1 tablet (0.6 mg total) by mouth 2 (two) times daily as needed (gout). 02/15/21   Donita Brooks, MD  empagliflozin (JARDIANCE) 25 MG TABS tablet TAKE ONE (1) TABLET BY MOUTH EVERY DAY BEFORE BREAKFAST 02/22/22   Donita Brooks, MD  HYDROcodone-acetaminophen (NORCO) 7.5-325 MG tablet TAKE 1 TABLET BY MOUTH EVERY 6 HOURS AS NEEDED 08/13/22   Donita Brooks, MD  ibuprofen (ADVIL) 800 MG tablet TAKE ONE TABLET BY MOUTH EVERY EIGHT HOURS AS NEEDED 07/03/22   Donita Brooks, MD  levocetirizine (XYZAL ALLERGY 24HR) 5 MG tablet Take 1 tablet (5 mg total) by mouth every evening. 09/03/21   Donita Brooks, MD  losartan (COZAAR) 100 MG tablet Take 1 tablet (100 mg total) by mouth daily. 02/22/22   Pickard,  Priscille Heidelberg, MD  methocarbamol (ROBAXIN-750) 750 MG tablet Take 1 tablet (750 mg total) by mouth every 8 (eight) hours as needed for muscle spasms (discontinue tizanidine). 05/14/22   Donita Brooks, MD  pravastatin (PRAVACHOL) 40 MG tablet Take 1 tablet (40 mg total) by mouth daily. 02/22/22   Donita Brooks, MD  predniSONE (DELTASONE) 20 MG tablet 3 tabs poqday 1-2, 2 tabs poqday 3-4, 1 tab poqday 5-6 05/10/22   Donita Brooks, MD  pregabalin (LYRICA) 75 MG capsule Take 1 capsule (75 mg total) by mouth 2 (two) times daily. 08/12/22 09/11/22  London Sheer, MD  sildenafil (VIAGRA) 100 MG tablet TAKE  ONE-HALF TO ONE TABLET (50 TO 100MG TOTAL) BY MOUTH DAILY AS NEEDED FOR ERECTILE DYSFUNCTION 07/08/22   Donita Brooks, MD    Family History Family History  Problem Relation Age of Onset   Diabetes Mother    Diabetes Brother     Social History Social History   Tobacco Use   Smoking status: Never   Smokeless tobacco: Never  Vaping Use   Vaping Use: Never used  Substance Use Topics   Alcohol use: No    Comment: previous heavy use, quit 2004   Drug use: No    Comment: previous marijuana use     Allergies   Patient has no known allergies.   Review of Systems Review of Systems Per HPI  Physical Exam Triage Vital Signs ED Triage Vitals  Enc Vitals Group     BP 09/09/22 1316 (!) 144/79     Pulse Rate 09/09/22 1316 78     Resp 09/09/22 1316 20     Temp 09/09/22 1316 97.8 F (36.6 C)     Temp Source 09/09/22 1316 Oral     SpO2 09/09/22 1316 97 %     Weight --      Height --      Head Circumference --      Peak Flow --      Pain Score 09/09/22 1315 10     Pain Loc --      Pain Edu? --      Excl. in GC? --    No data found.  Updated Vital Signs BP (!) 144/79 (BP Location: Right Arm)   Pulse 78   Temp 97.8 F (36.6 C) (Oral)   Resp 20   SpO2 97%   Visual Acuity Right Eye Distance:   Left Eye Distance:   Bilateral Distance:    Right Eye Near:   Left Eye Near:    Bilateral Near:     Physical Exam Vitals and nursing note reviewed.  Constitutional:      General: He is not in acute distress.    Appearance: Normal appearance. He is not toxic-appearing.  HENT:     Mouth/Throat:     Mouth: Mucous membranes are moist.     Pharynx: Oropharynx is clear.  Pulmonary:     Effort: Pulmonary effort is normal. No respiratory distress.  Musculoskeletal:     Lumbar back: Bony tenderness present. Negative right straight leg raise test and negative left straight leg raise test.     Right lower leg: No edema.     Left lower leg: No edema.       Legs:      Comments: Inspection: No swelling, obvious deformity, redness, or bruising to right low back/upper buttock Palpation: Right low back/upper buttock tender to palpation in area marked; no obvious deformities palpated  ROM:  Full ROM to back, bilateral lower extremities Strength: 5/5 bilateral lower extremities Neurovascular: neurovascularly intact in left and right lower extremity   Skin:    General: Skin is warm and dry.     Capillary Refill: Capillary refill takes less than 2 seconds.     Coloration: Skin is not jaundiced or pale.     Findings: No erythema.  Neurological:     Mental Status: He is alert and oriented to person, place, and time.  Psychiatric:        Behavior: Behavior is cooperative.      UC Treatments / Results  Labs (all labs ordered are listed, but only abnormal results are displayed) Labs Reviewed - No data to display  EKG   Radiology No results found.  Procedures Procedures (including critical care time)  Medications Ordered in UC Medications  ketorolac (TORADOL) injection 60 mg (60 mg Intramuscular Given 09/09/22 1405)    Initial Impression / Assessment and Plan / UC Course  I have reviewed the triage vital signs and the nursing notes.  Pertinent labs & imaging results that were available during my care of the patient were reviewed by me and considered in my medical decision making (see chart for details).   Patient is well-appearing, normotensive, afebrile, not tachycardic, not tachypneic, oxygenating well on room air.    1. Acute right-sided low back pain without sciatica Treat with Toradol 60 mg IM today in urgent care Discussed not to use NSAIDs for the next 24 hours Continue Tylenol and muscle relaxant as needed for pain Start back exercises and stretches and handout given Strict ER precautions discussed with patient  The patient was given the opportunity to ask questions.  All questions answered to their satisfaction.  The patient is in  agreement to this plan.    Final Clinical Impressions(s) / UC Diagnoses   Final diagnoses:  Acute right-sided low back pain without sciatica     Discharge Instructions      We have given you a shot of Toradol today for pain. Do not take any other NSAIDs today.  Continue Tylenol and Robaxin as needed for pain.  Start back exercises/stretches.  Seek care for persistent or worsening symptoms despite treatment.     ED Prescriptions   None    PDMP not reviewed this encounter.   Valentino Nose, NP 09/09/22 [phone]

## 2022-09-09 NOTE — ED Triage Notes (Signed)
Pt reports right lower back pain for last several days. Denies any known injury or lifting recently. Pt reports twisted wrong and reports pain ever since.

## 2022-09-09 NOTE — Discharge Instructions (Addendum)
We have given you a shot of Toradol today for pain. Do not take any other NSAIDs today.  Continue Tylenol and Robaxin as needed for pain.  Start back exercises/stretches.  Seek care for persistent or worsening symptoms despite treatment.

## 2022-09-10 ENCOUNTER — Other Ambulatory Visit: Payer: Self-pay | Admitting: Family Medicine

## 2022-09-10 NOTE — Telephone Encounter (Signed)
Pt called in stating that his insurance will not cover a 30 tablet refill of this med HYDROcodone-acetaminophen (NORCO) 7.5. Pt states that insurance will only cover a 20 tab refill of this med. Pt also would like a refill of a pain cream that pcp prescribed for him some time ago. Pt would like a cb from nurse to see if these prescriptions can be changed and sent in to pharmacy please.  Cb#: [phone]

## 2022-09-12 ENCOUNTER — Ambulatory Visit: Payer: Medicaid Other | Admitting: Orthopedic Surgery

## 2022-09-12 DIAGNOSIS — M5412 Radiculopathy, cervical region: Secondary | ICD-10-CM

## 2022-09-12 NOTE — Progress Notes (Signed)
Orthopedic Spine Surgery Office Note  Assessment: Patient is a 57 y.o. male with neck pain that radiates into the bilateral shoulders, suspect radiculopathy   Plan: -Explained that initially conservative treatment is tried as a significant number of patients may experience relief with these treatment modalities. Discussed that the conservative treatments include:  -activity modification  -physical therapy  -over the counter pain medications  -medrol dosepak  -cervical steroid injections -Patient has tried Tylenol, Toradol, Robaxin, Lyrica -Since he has been getting good relief with the Lyrica, I told him to continue that.  I explained that we could go up to 3 times per day at the 75 mg dosage if his pain gets worse -His pain is currently tolerable so we will hold off on getting the MRI.  If his pain gets worse, will order MRI of the cervical spine to evaluate for radiculopathy -Patient should return to office in 6 weeks, x-rays at next visit: None   Patient expressed understanding of the plan and all questions were answered to the patient's satisfaction.   __________________________________________________________________________  History: Patient is a 57 y.o. male who has been previously seen in the office for symptoms consistent with cervical radiculopathy.  Patient has been doing well since the last time I saw him.  He said the Lyrica is helping with his pain.  There are still some days where the pain is severe but the pain usually subsides by the next day.  He notes certain positions are worse in terms of pain.  He said if he sleeps on his side his pain is worse but it gets better if he lays on his back.  His pain is still in the neck and radiates into the bilateral shoulders in the area of the scapula and the lateral aspect of the shoulder.  It does not radiate past the shoulder on either side.  He has not noticed any numbness or paresthesias.  He has had no issues with fine motor skills  in his hands.  He has not noticed any unsteadiness or imbalance with gait. Denies bowel/bladder incontinence. No saddle anesthesia.   Previous treatments: Tylenol, Toradol, Robaxin, Lyrica   Physical Exam:   General: no acute distress, appears stated age Neurologic: alert, answering questions appropriately, following commands Respiratory: unlabored breathing on room air, symmetric chest rise Psychiatric: appropriate affect, normal cadence to speech     MSK (spine):   BMI of 33.1   -Strength exam                                                   Left                  Right Grip strength                5/5                  5/5 Interosseus                  5/5                  5/5 Wrist extension            5/5                  5/5 Wrist flexion  5/5                  5/5 Elbow flexion                5/5                  5/5 Deltoid                          5/5                  5/5   EHL                              5/5                  5/5 TA                                 5/5                  5/5 GSC                             5/5                  5/5 Knee extension            5/5                  5/5 Hip flexion                    5/5                  5/5   -Sensory exam                           Sensation intact to light touch in L3-S1 nerve distributions of bilateral lower extremities             Sensation intact to light touch in C5-T1 nerve distributions of bilateral upper extremities   -Brachioradialis DTR: 2/4 on the left, 2/4 on the right -Biceps DTR: 2/4 on the left, 2/4 on the right   -Spurling: Negative bilaterally -Hoffman sign: Negative bilaterally -Clonus: No beats bilaterally -Interosseous wasting: None seen -Grip and release test: Negative   Left shoulder exam: No pain through range of motion, negative Jobe, negative drop arm sign, negative belly press, negative Hawkins, no weakness with external rotation with arm at side Right shoulder  exam: No pain through range of motion, negative Hawkins, negative Jobe, negative belly press, no weakness with external rotation with arm at side  Imaging: XR of the cervical spine from 08/12/2022 was previously independently reviewed and interpreted, showing early anterior osteophyte formation seen at C4/5.  Disc height loss with anterior osteophyte formation at C5/6.  No fracture or dislocation seen.  No evidence of instability on flexion/extension views.    Patient name: Xavier Daniels Patient MRN: [id number] Date of visit: 09/12/22

## 2022-09-19 ENCOUNTER — Emergency Department (HOSPITAL_COMMUNITY)
Admission: EM | Admit: 2022-09-19 | Discharge: 2022-09-19 | Disposition: A | Discharge status: Home / Self Care | Payer: Medicaid Other | Attending: Emergency Medicine | Admitting: Emergency Medicine

## 2022-09-19 ENCOUNTER — Other Ambulatory Visit: Payer: Self-pay

## 2022-09-19 DIAGNOSIS — M5412 Radiculopathy, cervical region: Secondary | ICD-10-CM | POA: Insufficient documentation

## 2022-09-19 DIAGNOSIS — M542 Cervicalgia: Secondary | ICD-10-CM | POA: Diagnosis present

## 2022-09-19 MED ORDER — LIDOCAINE 5 % EX PTCH: 1.0000 | MEDICATED_PATCH | CUTANEOUS | 0 refills | Status: AC

## 2022-09-19 MED ORDER — OXYCODONE-ACETAMINOPHEN 5-325 MG PO TABS: 1.0000 | ORAL_TABLET | Freq: Four times a day (QID) | ORAL | 0 refills | Status: DC | PRN

## 2022-09-19 MED ORDER — METHYLPREDNISOLONE 4 MG PO TBPK: ORAL_TABLET | ORAL | 0 refills | Status: DC

## 2022-09-19 NOTE — ED Triage Notes (Signed)
Pt c/o bilateral shoulder pain, left worse than right. Pt states he has been seen by PCP for same and was told he may have pinched nerve and is scheduled to have MRI.

## 2022-09-19 NOTE — ED Provider Notes (Signed)
[place] EMERGENCY DEPARTMENT AT [facility] Provider Note   CSN: [id number] Arrival date & time: 09/19/22  0303     History  Chief Complaint  Patient presents with   Shoulder Pain    Xavier Daniels is a 57 y.o. male.  Patient presents to the emergency department for evaluation of neck and shoulder pain.  Patient has been having ongoing problems with pain on the left side of his neck going down his left arm.  His doctor told him he had a pinched nerve.  He was put on Lyrica and it helped at first but the pain is back.       Home Medications Prior to Admission medications   Medication Sig Start Date End Date Taking? Authorizing Provider  diclofenac Sodium (VOLTAREN) 1 % GEL APPLY 2 GRAMS FOUR TIMES A DAY 09/10/22   Donita Brooks, MD  lidocaine (LIDODERM) 5 % Place 1 patch onto the skin daily. Remove & Discard patch within 12 hours or as directed by MD 09/19/22  Yes Shauntell Iglesia, Canary Brim, MD  methylPREDNISolone (MEDROL DOSEPAK) 4 MG TBPK tablet As directed 09/19/22  Yes Johnluke Haugen, Canary Brim, MD  oxyCODONE-acetaminophen (PERCOCET/ROXICET) 5-325 MG tablet Take 1 tablet by mouth every 6 (six) hours as needed for severe pain. 09/19/22  Yes Rayn Shorb, Canary Brim, MD  allopurinol (ZYLOPRIM) 300 MG tablet TAKE ONE TABLET (300MG  TOTAL) BY MOUTH DAILY STOP ULORIC 01/22/22   Donita Brooks, MD  amLODipine (NORVASC) 10 MG tablet Take 1 tablet (10 mg total) by mouth daily. This replaces atenolol 02/22/22   Donita Brooks, MD  colchicine 0.6 MG tablet Take 1 tablet (0.6 mg total) by mouth 2 (two) times daily as needed (gout). 02/15/21   Donita Brooks, MD  empagliflozin (JARDIANCE) 25 MG TABS tablet TAKE ONE (1) TABLET BY MOUTH EVERY DAY BEFORE BREAKFAST 02/22/22   Donita Brooks, MD  HYDROcodone-acetaminophen (NORCO) 7.5-325 MG tablet TAKE 1 TABLET BY MOUTH EVERY 6 HOURS AS NEEDED 09/10/22   Donita Brooks, MD  ibuprofen (ADVIL) 800 MG tablet TAKE ONE TABLET BY MOUTH  EVERY EIGHT HOURS AS NEEDED 07/03/22   Donita Brooks, MD  levocetirizine (XYZAL ALLERGY 24HR) 5 MG tablet Take 1 tablet (5 mg total) by mouth every evening. 09/03/21   Donita Brooks, MD  losartan (COZAAR) 100 MG tablet Take 1 tablet (100 mg total) by mouth daily. 02/22/22   Donita Brooks, MD  methocarbamol (ROBAXIN-750) 750 MG tablet Take 1 tablet (750 mg total) by mouth every 8 (eight) hours as needed for muscle spasms (discontinue tizanidine). 05/14/22   Donita Brooks, MD  pravastatin (PRAVACHOL) 40 MG tablet Take 1 tablet (40 mg total) by mouth daily. 02/22/22   Donita Brooks, MD  predniSONE (DELTASONE) 20 MG tablet 3 tabs poqday 1-2, 2 tabs poqday 3-4, 1 tab poqday 5-6 05/10/22   Donita Brooks, MD  pregabalin (LYRICA) 75 MG capsule Take 1 capsule (75 mg total) by mouth 2 (two) times daily. 08/12/22 09/11/22  London Sheer, MD  sildenafil (VIAGRA) 100 MG tablet TAKE ONE-HALF TO ONE TABLET (50 TO 100MG TOTAL) BY MOUTH DAILY AS NEEDED FOR ERECTILE DYSFUNCTION 07/08/22   Donita Brooks, MD      Allergies    Patient has no known allergies.    Review of Systems   Review of Systems  Physical Exam Updated Vital Signs BP 139/83   Pulse 95   Resp 18   Ht 6\' 1"  (  1.854 m)   Wt 114 kg   SpO2 99%   BMI 33.16 kg/m  Physical Exam Vitals and nursing note reviewed.  Constitutional:      General: He is not in acute distress.    Appearance: He is well-developed.  HENT:     Head: Normocephalic and atraumatic.     Mouth/Throat:     Mouth: Mucous membranes are moist.  Eyes:     General: Vision grossly intact. Gaze aligned appropriately.     Extraocular Movements: Extraocular movements intact.     Conjunctiva/sclera: Conjunctivae normal.  Cardiovascular:     Rate and Rhythm: Normal rate and regular rhythm.     Pulses: Normal pulses.     Heart sounds: Normal heart sounds, S1 normal and S2 normal. No murmur heard.    No friction rub. No gallop.  Pulmonary:     Effort:  Pulmonary effort is normal. No respiratory distress.     Breath sounds: Normal breath sounds.  Abdominal:     Palpations: Abdomen is soft.     Tenderness: There is no abdominal tenderness. There is no guarding or rebound.     Hernia: No hernia is present.  Musculoskeletal:        General: No swelling.     Cervical back: Full passive range of motion without pain, normal range of motion and neck supple. No pain with movement, spinous process tenderness or muscular tenderness. Normal range of motion.     Right lower leg: No edema.     Left lower leg: No edema.  Skin:    General: Skin is warm and dry.     Capillary Refill: Capillary refill takes less than 2 seconds.     Findings: No ecchymosis, erythema, lesion or wound.  Neurological:     Mental Status: He is alert and oriented to person, place, and time.     GCS: GCS eye subscore is 4. GCS verbal subscore is 5. GCS motor subscore is 6.     Cranial Nerves: Cranial nerves 2-12 are intact.     Sensory: Sensation is intact.     Motor: Motor function is intact. No weakness or abnormal muscle tone.     Coordination: Coordination is intact.     Comments: Normal strength and sensation  Psychiatric:        Mood and Affect: Mood normal.        Speech: Speech normal.        Behavior: Behavior normal.     ED Results / Procedures / Treatments   Labs (all labs ordered are listed, but only abnormal results are displayed) Labs Reviewed - No data to display  EKG None  Radiology No results found.  Procedures Procedures    Medications Ordered in ED Medications - No data to display  ED Course/ Medical Decision Making/ A&P                             Medical Decision Making  Resents with persistent pain on the left side of his neck, shoulder and upper back area.  He was told he likely has a pinched nerve.  I do not see any indication of a neurosurgical emergency.  Patient with normal strength and sensation.  Normal vital signs.   Symptomatic treatment at this time, follow-up with his doctors for further outpatient workup including MRI.   Percocet take-home pack taken.  Patient does receive what appears to be scheduled prescriptions  for hydrocodone monthly by his primary doctor.  Indicates that he does not have any right now.  No new prescription to be written.        Final Clinical Impression(s) / ED Diagnoses Final diagnoses:  Cervical radiculopathy    Rx / DC Orders ED Discharge Orders          Ordered    lidocaine (LIDODERM) 5 %  Every 24 hours        09/19/22 0402    methylPREDNISolone (MEDROL DOSEPAK) 4 MG TBPK tablet        09/19/22 0402    oxyCODONE-acetaminophen (PERCOCET/ROXICET) 5-325 MG tablet  Every 6 hours PRN        09/19/22 0404              Gilda Crease, MD 09/19/22 [phone]

## 2022-09-20 ENCOUNTER — Telehealth: Payer: Self-pay | Admitting: Orthopedic Surgery

## 2022-09-20 DIAGNOSIS — M5412 Radiculopathy, cervical region: Secondary | ICD-10-CM

## 2022-09-20 MED FILL — Hydrocodone-Acetaminophen Tab 5-325 MG: ORAL | Qty: 6 | Status: AC

## 2022-09-20 NOTE — Telephone Encounter (Signed)
Patient would like to go ahead with getting a MRI, pain is getting too much to tolerate. please advise

## 2022-09-20 NOTE — Telephone Encounter (Signed)
Orthopedic Telephone Call  Patient's neck pain radiating into bilateral upper extremities has gotten worse in spine of conservative treatments. He has been doing conservative treatment for over six weeks now, so ordered MRI of the cervical spine to evaluate for radiculopathy. Will see him after the MRI to discuss the results and next steps in management.   Xavier Sheer, MD Orthopedic Surgeon

## 2022-09-23 ENCOUNTER — Telehealth: Payer: Self-pay

## 2022-09-23 NOTE — Transitions of Care (Post Inpatient/ED Visit) (Signed)
09/23/2022  Name: Xavier Daniels MRN: [id number] DOB: 03-14-1966  Today's TOC FU Call Status: Today's TOC FU Call Status:: Successful TOC FU Call Competed TOC FU Call Complete Date: 09/23/22  Transition Care Management Follow-up Telephone Call Date of Discharge: 09/19/22 [facility] Type of Discharge: Emergency Department Reason for ED Visit: Other: (Cervical radiculopathy) How have you been since you were released from the hospital?: Better Any questions or concerns?: No  Items Reviewed: Did you receive and understand the discharge instructions provided?: Yes Medications obtained,verified, and reconciled?: Yes (Medications Reviewed) Any new allergies since your discharge?: No Dietary orders reviewed?: Yes Do you have support at home?: Yes  Medications Reviewed Today: Medications Reviewed Today     Reviewed by Merleen Nicely, LPN (Licensed Practical Nurse) on 09/23/22 at 1334  Med List Status: <None>   Medication Order Taking? Sig Documenting Provider Last Dose Status Informant  allopurinol (ZYLOPRIM) 300 MG tablet [id number] Yes TAKE ONE TABLET (300MG  TOTAL) BY MOUTH DAILY STOP Jorje Guild, MD Taking Active   amLODipine (NORVASC) 10 MG tablet [id number] Yes Take 1 tablet (10 mg total) by mouth daily. This replaces atenolol Donita Brooks, MD Taking Active   colchicine 0.6 MG tablet [id number] Yes Take 1 tablet (0.6 mg total) by mouth 2 (two) times daily as needed (gout). Donita Brooks, MD Taking Active   diclofenac Sodium (VOLTAREN) 1 % GEL [id number] Yes APPLY 2 GRAMS FOUR TIMES A DAY Donita Brooks, MD Taking Active   empagliflozin (JARDIANCE) 25 MG TABS tablet [id number] Yes TAKE ONE (1) TABLET BY MOUTH EVERY DAY BEFORE BREAKFAST Donita Brooks, MD Taking Active   HYDROcodone-acetaminophen (NORCO) 7.5-325 MG tablet [id number] Yes TAKE 1 TABLET BY MOUTH EVERY 6 HOURS AS NEEDED Donita Brooks, MD Taking Active   ibuprofen  (ADVIL) 800 MG tablet [id number] Yes TAKE ONE TABLET BY MOUTH EVERY EIGHT HOURS AS NEEDED Donita Brooks, MD Taking Active   levocetirizine (XYZAL ALLERGY 24HR) 5 MG tablet [id number] Yes Take 1 tablet (5 mg total) by mouth every evening. Donita Brooks, MD Taking Active   lidocaine (LIDODERM) 5 % [id number] Yes Place 1 patch onto the skin daily. Remove & Discard patch within 12 hours or as directed by MD Pollina, Canary Brim, MD Taking Active   losartan (COZAAR) 100 MG tablet [id number] Yes Take 1 tablet (100 mg total) by mouth daily. Donita Brooks, MD Taking Active   methocarbamol (ROBAXIN-750) 750 MG tablet [id number] Yes Take 1 tablet (750 mg total) by mouth every 8 (eight) hours as needed for muscle spasms (discontinue tizanidine). Donita Brooks, MD Taking Active   methylPREDNISolone (MEDROL DOSEPAK) 4 MG TBPK tablet [id number] Yes As directed Pollina, Canary Brim, MD Taking Active   oxyCODONE-acetaminophen (PERCOCET/ROXICET) 5-325 MG tablet [id number] Yes Take 1 tablet by mouth every 6 (six) hours as needed for severe pain. Gilda Crease, MD Taking Active   pravastatin (PRAVACHOL) 40 MG tablet [id number] Yes Take 1 tablet (40 mg total) by mouth daily. Donita Brooks, MD Taking Active   predniSONE (DELTASONE) 20 MG tablet [id number] Yes 3 tabs poqday 1-2, 2 tabs poqday 3-4, 1 tab poqday 5-6 Donita Brooks, MD Taking Active   pregabalin (LYRICA) 75 MG capsule [id number] Yes Take 1 capsule (75 mg total) by mouth 2 (two) times daily. London Sheer, MD Taking Active   sildenafil (VIAGRA) 100 MG tablet [id number] Yes TAKE ONE-HALF TO ONE TABLET (50 TO 100MG TOTAL) BY MOUTH  DAILY AS NEEDED FOR ERECTILE DYSFUNCTION Donita Brooks, MD Taking Active             Home Care and Equipment/Supplies: Were Home Health Services Ordered?: NA Any new equipment or medical supplies ordered?: NA  Functional Questionnaire: Do you need assistance with bathing/showering or dressing?:  No Do you need assistance with meal preparation?: No Do you need assistance with eating?: No Do you have difficulty maintaining continence: No Do you need assistance with getting out of bed/getting out of a chair/moving?: No Do you have difficulty managing or taking your medications?: No  Follow up appointments reviewed: PCP Follow-up appointment confirmed?: Yes Date of PCP follow-up appointment?: 09/24/22 Follow-up Provider: [facility] Follow-up appointment confirmed?: Yes Date of Specialist follow-up appointment?: 10/24/22 Follow-Up Specialty Provider:: Dr Christell Constant Do you need transportation to your follow-up appointment?: No Do you understand care options if your condition(s) worsen?: Yes-patient verbalized understanding    SIGNATURE  Woodfin Ganja LPN [facility] Nurse Health Advisor Direct Dial [phone]

## 2022-09-24 ENCOUNTER — Ambulatory Visit: Payer: Medicaid Other | Admitting: Family Medicine

## 2022-09-24 ENCOUNTER — Encounter: Payer: Self-pay | Admitting: Family Medicine

## 2022-09-24 VITALS — BP 126/80 | HR 75 | Temp 98.3°F | Ht 73.0 in | Wt 252.0 lb

## 2022-09-24 DIAGNOSIS — M5030 Other cervical disc degeneration, unspecified cervical region: Secondary | ICD-10-CM | POA: Diagnosis not present

## 2022-09-24 MED ORDER — PREGABALIN 100 MG PO CAPS: 100.0000 mg | ORAL_CAPSULE | Freq: Three times a day (TID) | ORAL | 0 refills | Status: DC

## 2022-09-24 NOTE — Progress Notes (Signed)
Subjective:    Patient ID: Xavier Daniels, male    DOB: 1965/08/27, 57 y.o.   MRN: [id number]  Shoulder Pain   Patient has been diagnosed with cervical radiculopathy.  Please see my previous office visits.  He has been dealing with pain radiating from his neck into his left shoulder and left arm for more than a year.  A CT scan of the cervical spine last year showed degenerative disc disease at C5-C6.  He has been treated with steroid taper packs in the past which have helped substantially.  When I last saw the patient in March, we decided not to pursue an MRI because his pain was relatively well-controlled.  However recently had to go to the emergency room again due to exacerbation of the pain.  He is seeing an orthopedist who has ordered an MRI of his neck on Saturday.  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, CLEAN FOR 13 YEARS   Past Surgical History:  Procedure Laterality Date   BIOPSY  04/20/2019   Procedure: BIOPSY;  Surgeon: West Bali, MD;  Location: [facility];  Service: Endoscopy;;  transverse polyp x1   COLONOSCOPY WITH PROPOFOL N/A 04/20/2019   Procedure: COLONOSCOPY WITH PROPOFOL;  Surgeon: West Bali, MD;  Location: [facility];  Service: Endoscopy;  Laterality: N/A;  9:30am-office rescheduled 04/20/19 @ 8:30am   POLYPECTOMY  04/20/2019   Procedure: POLYPECTOMY;  Surgeon: West Bali, MD;  Location: [facility];  Service: Endoscopy;;  transversex1, rectal polyp   TOTAL HIP ARTHROPLASTY  2007   due to injury for avascular necrosis.   Current Outpatient Medications on File Prior to Visit  Medication Sig Dispense Refill   allopurinol (ZYLOPRIM) 300 MG tablet TAKE ONE TABLET (300MG  TOTAL) BY MOUTH DAILY STOP ULORIC 30  tablet 6   amLODipine (NORVASC) 10 MG tablet Take 1 tablet (10 mg total) by mouth daily. This replaces atenolol 90 tablet 3   colchicine 0.6 MG tablet Take 1 tablet (0.6 mg total) by mouth 2 (two) times daily as needed (gout). 30 tablet 1   diclofenac Sodium (VOLTAREN) 1 % GEL APPLY 2 GRAMS FOUR TIMES A DAY 100 g 0   empagliflozin (JARDIANCE) 25 MG TABS tablet TAKE ONE (1) TABLET BY MOUTH EVERY DAY BEFORE BREAKFAST 90 tablet 3   HYDROcodone-acetaminophen (NORCO) 7.5-325 MG tablet TAKE 1 TABLET BY MOUTH EVERY 6 HOURS AS NEEDED 30 tablet 0   ibuprofen (ADVIL) 800 MG tablet TAKE ONE TABLET BY MOUTH EVERY EIGHT HOURS AS NEEDED 60 tablet 1   levocetirizine (XYZAL ALLERGY 24HR) 5 MG tablet Take 1 tablet (5 mg total) by mouth every evening. 30 tablet 5   lidocaine (LIDODERM) 5 % Place 1 patch onto the skin daily. Remove & Discard patch within 12 hours or as directed by MD 30 patch 0   losartan (COZAAR) 100 MG tablet Take 1 tablet (100 mg total) by mouth daily. 90 tablet 3   methocarbamol (ROBAXIN-750) 750 MG tablet Take 1 tablet (750 mg total) by mouth every 8 (eight) hours as needed for muscle spasms (discontinue tizanidine). 60 tablet 0   methylPREDNISolone (MEDROL DOSEPAK) 4 MG TBPK tablet As directed  21 tablet 0   oxyCODONE-acetaminophen (PERCOCET/ROXICET) 5-325 MG tablet Take 1 tablet by mouth every 6 (six) hours as needed for severe pain. 6 tablet 0   pravastatin (PRAVACHOL) 40 MG tablet Take 1 tablet (40 mg total) by mouth daily. 90 tablet 3   predniSONE (DELTASONE) 20 MG tablet 3 tabs poqday 1-2, 2 tabs poqday 3-4, 1 tab poqday 5-6 12 tablet 0   pregabalin (LYRICA) 75 MG capsule Take 1 capsule (75 mg total) by mouth 2 (two) times daily. 60 capsule 0   sildenafil (VIAGRA) 100 MG tablet TAKE ONE-HALF TO ONE TABLET (50 TO 100MG TOTAL) BY MOUTH DAILY AS NEEDED FOR ERECTILE DYSFUNCTION 5 tablet 3   No current facility-administered medications on file prior to visit.   No Known Allergies Social  History   Socioeconomic History   Marital status: Married    Spouse name: Not on file   Number of children: Not on file   Years of education: Not on file   Highest education level: Not on file  Occupational History   Not on file  Tobacco Use   Smoking status: Never   Smokeless tobacco: Never  Vaping Use   Vaping Use: Never used  Substance and Sexual Activity   Alcohol use: No    Comment: previous heavy use, quit 2004   Drug use: No    Comment: previous marijuana use   Sexual activity: Yes    Birth control/protection: None  Other Topics Concern   Not on file  Social History Narrative   Not on file   Social Determinants of Health   Financial Resource Strain: Not on file  Food Insecurity: Not on file  Transportation Needs: Not on file  Physical Activity: Not on file  Stress: Not on file  Social Connections: Not on file  Intimate Partner Violence: Not on file      Review of Systems  All other systems reviewed and are negative.      Objective:   Physical Exam Vitals reviewed.  Constitutional:      General: He is not in acute distress.    Appearance: Normal appearance. He is not ill-appearing or toxic-appearing.  HENT:     Right Ear: Tympanic membrane and ear canal normal.     Left Ear: Tympanic membrane normal.     Mouth/Throat:     Pharynx: No oropharyngeal exudate or posterior oropharyngeal erythema.  Cardiovascular:     Rate and Rhythm: Normal rate and regular rhythm.     Pulses: Normal pulses.     Heart sounds: Normal heart sounds. No murmur heard.    No friction rub. No gallop.  Pulmonary:     Effort: Pulmonary effort is normal. No respiratory distress.     Breath sounds: Normal breath sounds. No stridor. No wheezing, rhonchi or rales.  Musculoskeletal:     Right lower leg: No edema.     Left lower leg: No edema.  Lymphadenopathy:     Cervical: No cervical adenopathy.  Neurological:     Mental Status: He is alert.           Assessment &  Plan:  DDD (degenerative disc disease), cervical Increase Lyrica to 100 mg p.o. 3 times daily.  He uses hydrocodone for breakthrough pain.  30 tablets last him 1 month.  Await the results of the MRI.  Patient may benefit from epidural steroid injections.

## 2022-09-28 ENCOUNTER — Other Ambulatory Visit: Payer: Medicaid Other

## 2022-09-28 ENCOUNTER — Other Ambulatory Visit: Payer: Self-pay | Admitting: Family Medicine

## 2022-09-30 NOTE — Telephone Encounter (Signed)
Requested medication (s) are due for refill today: Yes  Requested medication (s) are on the active medication list: Yes  Last refill:  09/10/22  Future visit scheduled: No  Notes to clinic:  Unable to refill per protocol, cannot delegate.      Requested Prescriptions  Pending Prescriptions Disp Refills   HYDROcodone-acetaminophen (NORCO) 7.5-325 MG tablet [Pharmacy Med Name: HYDROCODONE-APAP 7.5-325 MG TAB] 30 tablet     Sig: TAKE 1 TABLET BY MOUTH EVERY 6 HOURS AS NEEDED     Not Delegated - Analgesics:  Opioid Agonist Combinations Failed - 09/28/2022  9:40 AM      Failed - This refill cannot be delegated      Failed - Urine Drug Screen completed in last 360 days      Failed - Valid encounter within last 3 months    Recent Outpatient Visits           1 year ago Essential hypertension   [facility] Pickard, Priscille Heidelberg, MD   1 year ago Essential hypertension   [facility] Donita Brooks, MD   1 year ago Type 2 diabetes mellitus with other specified complication, without long-term current use of insulin (HCC)   [facility] Pickard, Priscille Heidelberg, MD   1 year ago Strain of calf muscle, right, initial encounter   [facility] Donita Brooks, MD   1 year ago Type 2 diabetes mellitus with other specified complication, without long-term current use of insulin (HCC)   [facility] Pickard, Priscille Heidelberg, MD       Future Appointments             In 3 weeks Christell Constant Tyrone Apple, MD [facility]

## 2022-10-01 ENCOUNTER — Other Ambulatory Visit: Payer: Self-pay | Admitting: Family Medicine

## 2022-10-11 ENCOUNTER — Other Ambulatory Visit: Payer: Self-pay | Admitting: Family Medicine

## 2022-10-14 NOTE — Telephone Encounter (Signed)
Requested medication (s) are due for refill today:yes  Requested medication (s) are on the active medication list: yes  Last refill:  10/01/22 # 30  Future visit scheduled: no  Notes to clinic:  med not delegated to NT to RF   Requested Prescriptions  Pending Prescriptions Disp Refills   HYDROcodone-acetaminophen (NORCO) 7.5-325 MG tablet [Pharmacy Med Name: HYDROCODONE-APAP 7.5-325 MG TAB] 30 tablet     Sig: TAKE 1 TABLET BY MOUTH EVERY 6 HOURS AS NEEDED     Not Delegated - Analgesics:  Opioid Agonist Combinations Failed - 10/11/2022  4:34 PM      Failed - This refill cannot be delegated      Failed - Urine Drug Screen completed in last 360 days      Failed - Valid encounter within last 3 months    Recent Outpatient Visits           1 year ago Essential hypertension   [facility] Donita Brooks, MD   1 year ago Essential hypertension   [facility] Donita Brooks, MD   1 year ago Type 2 diabetes mellitus with other specified complication, without long-term current use of insulin (HCC)   [facility] Pickard, Priscille Heidelberg, MD   1 year ago Strain of calf muscle, right, initial encounter   [facility] Donita Brooks, MD   1 year ago Type 2 diabetes mellitus with other specified complication, without long-term current use of insulin (HCC)   [facility] Pickard, Priscille Heidelberg, MD       Future Appointments             In 1 week Christell Constant Tyrone Apple, MD [facility]

## 2022-10-16 ENCOUNTER — Other Ambulatory Visit: Payer: Self-pay | Admitting: Family Medicine

## 2022-10-16 NOTE — Telephone Encounter (Signed)
Patient called to follow up on refill request for  HYDROcodone-acetaminophen (NORCO) 7.5-325 MG tablet [[id number]]  **Patient stated Prior auth required by insurance. Pharmacy has only been dispensing him a 5 day supply (20 pills) at a time. **Patient stated he discussed this with Dr. Tanya Nones and was reassured it would be handled** **Unsure of status of PA**  **Almost out of pills**  Pharmacy confirmed as  [facility] - [place], [place] - [address], [place] [address] Phone: [phone]  Fax: [phone]   Requesting call back with status update.   Please advise at [phone].

## 2022-10-18 ENCOUNTER — Telehealth: Payer: Self-pay

## 2022-10-18 NOTE — Telephone Encounter (Signed)
Tried calling pt this morning, NA, sent msg to pt with pcp's msg," Good Morning,   Regarding your medication refill request for the HYDROcodone-acetaminophen (NORCO) 7.5-325 MG tablet [[id number]] .  Per pcp," I give him 30 pills a month.  His last refill was 6/18.  This is not due to be refilled again until 7/18."   If you have any other questions please contact our office at [phone].  Have a great day. "

## 2022-10-18 NOTE — Telephone Encounter (Signed)
Patient returned call. I advised him of PCP's response. He states that he only picked up 20 pills on 6/18 since there was no PA done for the 30 day prescription. He is asking have we done the PA for him so he can get 30 days.  CB# [phone]

## 2022-10-22 ENCOUNTER — Other Ambulatory Visit: Payer: Self-pay | Admitting: Family Medicine

## 2022-10-22 MED ORDER — HYDROCODONE-ACETAMINOPHEN 7.5-325 MG PO TABS: 1.0000 | ORAL_TABLET | Freq: Four times a day (QID) | ORAL | 0 refills | Status: DC | PRN

## 2022-10-22 NOTE — Telephone Encounter (Signed)
Requested medication (s) are due for refill today: yes  Requested medication (s) are on the active medication list: yes  Last refill:  01/22/22 #30 6 RF  Future visit scheduled: no  Notes to clinic:  overdue lab work   Requested Prescriptions  Pending Prescriptions Disp Refills   allopurinol (ZYLOPRIM) 300 MG tablet [Pharmacy Med Name: ALLOPURINOL 300 MG TAB] 90 tablet     Sig: TAKE ONE (1) TABLET BY MOUTH EVERY DAY     Endocrinology:  Gout Agents - allopurinol Failed - 10/22/2022 10:59 AM      Failed - Uric Acid in normal range and within 360 days    Uric Acid, Serum  Date Value Ref Range Status  10/31/2020 5.1 4.0 - 8.0 mg/dL Final    Comment:    Therapeutic target for gout patients: <6.0 mg/dL .          Failed - Valid encounter within last 12 months    Recent Outpatient Visits           1 year ago Essential hypertension   [facility] Pickard, Priscille Heidelberg, MD   1 year ago Essential hypertension   [facility] Tanya Nones, Priscille Heidelberg, MD   1 year ago Type 2 diabetes mellitus with other specified complication, without long-term current use of insulin (HCC)   [facility] Pickard, Priscille Heidelberg, MD   1 year ago Strain of calf muscle, right, initial encounter   [facility] Pickard, Priscille Heidelberg, MD   1 year ago Type 2 diabetes mellitus with other specified complication, without long-term current use of insulin (HCC)   [facility] Pickard, Priscille Heidelberg, MD       Future Appointments             In 2 days London Sheer, MD [facility]            Passed - Cr in normal range and within 360 days    Creat  Date Value Ref Range Status  05/10/2022 1.09 0.70 - 1.30 mg/dL Final         Passed - CBC within normal limits and completed in the last 12 months    WBC  Date Value Ref Range Status  01/02/2022 4.4 3.8 - 10.8 Thousand/uL Final   RBC  Date Value Ref Range Status  01/02/2022  5.03 4.20 - 5.80 Million/uL Final   Hemoglobin  Date Value Ref Range Status  01/02/2022 14.8 13.2 - 17.1 g/dL Final   HCT  Date Value Ref Range Status  01/02/2022 44.6 38.5 - 50.0 % Final   MCHC  Date Value Ref Range Status  01/02/2022 33.2 32.0 - 36.0 g/dL Final   [facility]  Date Value Ref Range Status  01/02/2022 29.4 27.0 - 33.0 pg Final   MCV  Date Value Ref Range Status  01/02/2022 88.7 80.0 - 100.0 fL Final   No results found for: "PLTCOUNTKUC", "LABPLAT", "POCPLA" RDW  Date Value Ref Range Status  01/02/2022 14.0 11.0 - 15.0 % Final

## 2022-10-22 NOTE — Telephone Encounter (Signed)
PA done for 30 day prescription for pt's Hydrocodone. Approved through patient's insurance:  Xavier Daniels (Key: [id number]) PA Case ID #: [id number] Outcome Approved on July 8 PA Case: [id number], Status: Approved, Coverage Starts on: 10/21/2022 12:00:00 AM, Coverage Ends on: 04/19/2023 12:00:00 AM.  Pt asks if a new Rx can be sent in for the approved 30 day supply? Thank you.

## 2022-10-24 ENCOUNTER — Ambulatory Visit (INDEPENDENT_AMBULATORY_CARE_PROVIDER_SITE_OTHER): Payer: Medicaid Other | Admitting: Orthopedic Surgery

## 2022-10-24 DIAGNOSIS — M5412 Radiculopathy, cervical region: Secondary | ICD-10-CM | POA: Diagnosis not present

## 2022-10-24 MED ORDER — HYDROCODONE-ACETAMINOPHEN 7.5-325 MG PO TABS: 1.0000 | ORAL_TABLET | Freq: Four times a day (QID) | ORAL | 0 refills | Status: DC | PRN

## 2022-10-24 NOTE — Progress Notes (Addendum)
Orthopedic Spine Surgery Office Note   Assessment: Patient is a 57 y.o. male with neck pain that radiates into the bilateral shoulders, suspect radiculopathy     Plan: -Patient has tried Tylenol, Toradol, Robaxin, Lyrica, norco, prednisone -Went to the ER with worsening pain. He has been trying conservative treatment for over six weeks now and pain has gotten to the point that he is requiring narcotics, so recommended MRI to evaluate for radiculopathy -Prescribed more norco. Told him this would be the one and only prescription because I use narcotics to control post-operative pain -Will try Pt as an additional non-operative treatment since he has not tried that -Patient should return to office in 4 weeks, x-rays at next visit: None     Patient expressed understanding of the plan and all questions were answered to the patient's satisfaction.    __________________________________________________________________________   History: Patient is a 57 y.o. male who has been previously seen in the office for symptoms consistent with cervical radiculopathy.  Patient has had worsening of his neck pain that radiates into the bilateral shoulders.  He went to the emergency department for this pain.  He was prescribed Norco and has noticed that his pain is better under control without medication.  He is only taking at night.  He was also given prednisone which was helpful.  He is still having pain in his neck radiating into the shoulders.  He feels it on a daily basis.  No bowel or bladder incontinence.  No saddle anesthesia.  No worsening hand function or unsteadiness with gait.   Previous treatments: Tylenol, Toradol, Robaxin, Lyrica, norco, prednisone     Physical Exam:   General: no acute distress, appears stated age Neurologic: alert, answering questions appropriately, following commands Respiratory: unlabored breathing on room air, symmetric chest rise Psychiatric: appropriate affect, normal  cadence to speech     MSK (spine):     -Strength exam                                                   Left                  Right Grip strength                5/5                  5/5 Interosseus                  5/5                  5/5 Wrist extension            5/5                  5/5 Wrist flexion                 5/5                  5/5 Elbow flexion                5/5                  5/5 Deltoid  5/5                  5/5   EHL                              5/5                  5/5 TA                                 5/5                  5/5 GSC                             5/5                  5/5 Knee extension            5/5                  5/5 Hip flexion                    5/5                  5/5   -Sensory exam                           Sensation intact to light touch in L3-S1 nerve distributions of bilateral lower extremities             Sensation intact to light touch in C5-T1 nerve distributions of bilateral upper extremities   -Brachioradialis DTR: 2/4 on the left, 2/4 on the right -Biceps DTR: 2/4 on the left, 2/4 on the right   -Spurling: Negative bilaterally -Hoffman sign: Negative bilaterally -Clonus: No beats bilaterally -Interosseous wasting: None seen -Grip and release test: Negative   Left shoulder exam: No pain through range of motion, negative Jobe, negative drop arm sign, negative belly press, negative Hawkins, no weakness with external rotation with arm at side Right shoulder exam: No pain through range of motion, negative Hawkins, negative Jobe, negative belly press, no weakness with external rotation with arm at side   Imaging: XR of the cervical spine from 08/12/2022 was previously independently reviewed and interpreted, showing early anterior osteophyte formation seen at C4/5.  Disc height loss with anterior osteophyte formation at C5/6.  No fracture or dislocation seen.  No evidence of instability on flexion/extension  views.       Patient name: Xavier Daniels Patient MRN: [id number] Date of visit: 10/24/22

## 2022-11-11 ENCOUNTER — Other Ambulatory Visit: Payer: Self-pay | Admitting: Family Medicine

## 2022-11-14 ENCOUNTER — Other Ambulatory Visit: Payer: Self-pay | Admitting: Orthopedic Surgery

## 2022-11-14 ENCOUNTER — Other Ambulatory Visit: Payer: Self-pay | Admitting: Family Medicine

## 2022-11-14 DIAGNOSIS — M5412 Radiculopathy, cervical region: Secondary | ICD-10-CM

## 2022-11-15 NOTE — Telephone Encounter (Signed)
Patient called to follow up on refill request for HYDROcodone-acetaminophen (NORCO) 7.5-325 MG tablet [[id number]]  DISCONTINUED - requesting 30 tablets. Patient has been out of medication for 2 days.   Pharmacy confirmed as   [facility] - [place], [place] - [address], [place] [address] Phone: [phone]  Fax: [phone]  **Wrong script listed on current med list. Please adjust**  Please advise at [phone].

## 2022-11-15 NOTE — Telephone Encounter (Signed)
Requested medication (s) are due for refill today:   No  prescribed by another provider  Requested medication (s) are on the active medication list:   Yes  Future visit scheduled:   N/A for this situation   Last ordered: 11/14/2022 #15, 0 refills  Non delegated refill request that was ordered by Dr. Willia Craze from [facility].   Returned because it's a non delegated refill so unable to do anything with it per protocol.     Requested Prescriptions  Pending Prescriptions Disp Refills   HYDROcodone-acetaminophen (NORCO) 7.5-325 MG tablet [Pharmacy Med Name: HYDROCODONE-APAP 7.5-325 MG TAB] 30 tablet     Sig: TAKE 1 TABLET BY MOUTH EVERY 6 HOURS AS NEEDED     Not Delegated - Analgesics:  Opioid Agonist Combinations Failed - 11/14/2022 12:32 PM      Failed - This refill cannot be delegated      Failed - Urine Drug Screen completed in last 360 days      Failed - Valid encounter within last 3 months    Recent Outpatient Visits           1 year ago Essential hypertension   [facility] Pickard, Priscille Heidelberg, MD   1 year ago Essential hypertension   [facility] Donita Brooks, MD   1 year ago Type 2 diabetes mellitus with other specified complication, without long-term current use of insulin (HCC)   [facility] Pickard, Priscille Heidelberg, MD   1 year ago Strain of calf muscle, right, initial encounter   [facility] Donita Brooks, MD   1 year ago Type 2 diabetes mellitus with other specified complication, without long-term current use of insulin (HCC)   [facility] Pickard, Priscille Heidelberg, MD       Future Appointments             In 6 days London Sheer, MD [facility]

## 2022-11-19 ENCOUNTER — Other Ambulatory Visit: Payer: Self-pay | Admitting: Family Medicine

## 2022-11-19 MED ORDER — HYDROCODONE-ACETAMINOPHEN 7.5-325 MG PO TABS: 1.0000 | ORAL_TABLET | Freq: Four times a day (QID) | ORAL | 0 refills | Status: DC | PRN

## 2022-11-21 ENCOUNTER — Ambulatory Visit: Payer: Medicaid Other | Admitting: Orthopedic Surgery

## 2022-11-21 NOTE — Addendum Note (Signed)
Addended by: Willia Craze on: 11/21/2022 10:03 AM   Modules accepted: Orders

## 2022-11-26 ENCOUNTER — Telehealth: Payer: Self-pay | Admitting: *Deleted

## 2022-11-26 NOTE — Telephone Encounter (Signed)
Order ID: [id number]       In Progress  Anticipated Determination Date: 12/04/2022

## 2022-11-26 NOTE — Telephone Encounter (Signed)
-----   Message from Rolanda Lundborg sent at 11/20/2022  1:52 PM EDT ----- Can you please check the status of the MRI for this patient. It was ordered x 2 months ago. Looks like 10/01/22 it was pending review.

## 2022-12-05 NOTE — Therapy (Signed)
OUTPATIENT PHYSICAL THERAPY CERVICAL EVALUATION   Patient Name: Xavier Daniels MRN: [id number] DOB:03-27-1966, 57 y.o., male Today's Date: 12/06/2022  END OF SESSION:  PT End of Session - 12/06/22 1237     Visit Number 1    Number of Visits 13    Date for PT Re-Evaluation 01/24/23    Authorization Type [place] MEDICAID [place] BLUE    PT Start Time 1233    PT Stop Time 1315    PT Time Calculation (min) 42 min    Activity Tolerance Patient tolerated treatment well    Behavior During Therapy WFL for tasks assessed/performed             Past Medical History:  Diagnosis Date   Arthritis    Colon cancer screening 10/20/2018   Diabetes mellitus without complication (HCC)    Gout    Gout    Hypertension    Substance abuse (HCC)    ETOH, 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, CLEAN FOR 13 YEARS   Past Surgical History:  Procedure Laterality Date   BIOPSY  04/20/2019   Procedure: BIOPSY;  Surgeon: West Bali, MD;  [facility];  Service: Endoscopy;;  transverse polyp x1   COLONOSCOPY WITH PROPOFOL N/A 04/20/2019   Procedure: COLONOSCOPY WITH PROPOFOL;  Surgeon: West Bali, MD;  Location: [facility];  Service: Endoscopy;  Laterality: N/A;  9:30am-office rescheduled 04/20/19 @ 8:30am   POLYPECTOMY  04/20/2019   Procedure: POLYPECTOMY;  Surgeon: West Bali, MD;  Location: [facility];  Service: Endoscopy;;  transversex1, rectal polyp   TOTAL HIP ARTHROPLASTY  2007   due to injury for avascular necrosis.   Patient Active Problem List   Diagnosis Date Noted   Chronic pain of left knee 12/14/2019   Depression, major, single episode, mild (HCC) 12/14/2019   Left ankle pain 12/14/2019   T2DM (type 2 diabetes mellitus) (HCC) 09/10/2019   Essential hypertension 06/06/2014   Hyperlipidemia 06/06/2014   Gout 06/06/2014   Obesity 06/06/2014    PCP: Donita Brooks, MD  REFERRING PROVIDER: London Sheer, MD  REFERRING DIAG: [phone] (ICD-10-CM) - Radiculopathy, cervical  region   THERAPY DIAG:  Cervicalgia  Cramp and spasm  Abnormal posture  Rationale for Evaluation and Treatment: Rehabilitation  ONSET DATE: 3-4 months  SUBJECTIVE:                                                                                                                                                                                                         SUBJECTIVE STATEMENT: Pt reports  he is experiencing pain into his L shoulder blade and upper shoulder for 3-4 months. Pt indicates the pain has stayed about the same, although lifting will increase the pain for a limited time frame. He notes sleeping on his L side usually increases the pain.  Hand dominance: Right  PERTINENT HISTORY:  DM, high BMI  PAIN:  Are you having pain? Yes: NPRS scale: 0/10 Pain location: L shoulder blade and upper shoulder  Pain description: ache, sharp, intermittent Aggravating factors: lifting. Lying on L side  Relieving factors: Massage gun, massage  Pain range: 0-10/10   PRECAUTIONS: None  RED FLAGS: None     WEIGHT BEARING RESTRICTIONS: No  FALLS:  Has patient fallen in last 6 months? No  LIVING ENVIRONMENT: Lives with: lives with their family Lives in: House/apartment   OCCUPATION: Not working  PLOF: Independent  PATIENT GOALS: Pain relief  NEXT MD VISIT: to contact Dr. Christell Constant after PT  OBJECTIVE:   DIAGNOSTIC FINDINGS:  Imaging: XR of the cervical spine from 08/12/2022 was previously independently reviewed and interpreted, showing early anterior osteophyte formation seen at C4/5.  Disc height loss with anterior osteophyte formation at C5/6.  No fracture or dislocation seen.  No evidence of instability on flexion/extension views.  PATIENT SURVEYS:  FOTO: Perceived function   46%, predicted   58%   COGNITION: Overall cognitive status: Within functional limits for tasks assessed  SENSATION: WFL  POSTURE: forward head and CT step off  PALPATION: TTP to the L  upper trap and levator with increased muscle tension   CERVICAL ROM:   Active ROM AROM (deg) eval  Flexion 40 no pain  Extension 40 pain L  Right lateral flexion 20 pulling L  Left lateral flexion 20 pain L  Right rotation 50 no pain  Left rotation 40 no pain    (Blank rows = not tested)  UPPER EXTREMITY ROM:  WNLs and equal Active ROM Right eval Left eval  Shoulder flexion    Shoulder extension    Shoulder abduction    Shoulder adduction    Shoulder extension    Shoulder internal rotation    Shoulder external rotation    Elbow flexion    Elbow extension    Wrist flexion    Wrist extension    Wrist ulnar deviation    Wrist radial deviation    Wrist pronation    Wrist supination     (Blank rows = not tested)  UPPER EXTREMITY MMT: Myotome screen neg, both WNLs and equal MMT Right eval Left eval  Shoulder flexion    Shoulder extension    Shoulder abduction    Shoulder adduction    Shoulder extension    Shoulder internal rotation    Shoulder external rotation    Middle trapezius    Lower trapezius    Elbow flexion    Elbow extension    Wrist flexion    Wrist extension    Wrist ulnar deviation    Wrist radial deviation    Wrist pronation    Wrist supination    Grip strength     (Blank rows = not tested)  CERVICAL SPECIAL TESTS:  Neck flexor muscle endurance test: Negative, Spurling's test: Positive, and Distraction test: Positive  FUNCTIONAL TESTS:  NT  TODAY'S TREATMENT:   [facility] Adult PT Treatment:  DATE: 12/06/22 Therapeutic Exercise: Developed, instructed in, and pt completed therex as noted in HEP  Self Care: Use of moist heat for syptom relief                                                                                                                            PATIENT EDUCATION:  Education details: Eval findings, POC, HEP, self care  Person educated: Patient Education method: Explanation,  Demonstration, Tactile cues, Verbal cues, and Handouts Education comprehension: verbalized understanding, returned demonstration, verbal cues required, tactile cues required, and needs further education  HOME EXERCISE PROGRAM: Access Code: [email or web address] URL: [email or web address] Date: 12/06/2022 Prepared by: Joellyn Rued  Exercises - Supine Cervical Retraction with Towel  - 2 x daily - 7 x weekly - 1 sets - 10 reps - 3 hold - Supine DNF Liftoffs  - 2 x daily - 7 x weekly - 1 sets - 10 reps - 5 hold - Seated Passive Cervical Retraction  - 6 x daily - 7 x weekly - 1 sets - 3-5 reps - 3 hold - Standing Shoulder Horizontal Abduction with Resistance  - 2 x daily - 7 x weekly - 1 sets - 4 reps - 3 hold  ASSESSMENT:  CLINICAL IMPRESSION: Patient is a 57 y.o. male who was seen today for physical therapy evaluation and treatment for M54.12 (ICD-10-CM) - Radiculopathy, cervical region .   OBJECTIVE IMPAIRMENTS: decreased ROM, increased muscle spasms, impaired UE functional use, postural dysfunction, obesity, and pain.   ACTIVITY LIMITATIONS: carrying, lifting, and reach over head  PARTICIPATION LIMITATIONS: cleaning and yard work  PERSONAL FACTORS: Past/current experiences, Time since onset of injury/illness/exacerbation, and 1-2 comorbidities: DM, high BMI  are also affecting patient's functional outcome.   REHAB POTENTIAL: Good  CLINICAL DECISION MAKING: Evolving/moderate complexity  EVALUATION COMPLEXITY: Moderate   GOALS:  SHORT TERM GOALS: Target date: 12/27/22  Pt will be Ind in an initial HEP Baseline: started Goal status: INITIAL  2.  Pt will voice understanding of measures to assist in pain reduction Baseline: started Goal status: INITIAL  LONG TERM GOALS: Target date: 01/24/23  Pt will be Ind in a final HEP to maintain achieved LOF  Baseline: started Goal status: INITIAL  2.  Increased cervical side bending and rotation by 10d for improved neck  function Baseline: see flow sheet Goal status: INITIAL  3.  Pt will demonstrate proper sitting posture Baseline:  Goal status: INITIAL  4.  Pt will reported 75% or greater improvement in pain with daily activities and sleep Baseline: 0-10-10 Goal status: INITIAL  5.  Pt's FOTO score will improved to the predicted value of 58% as indication of improved function  Baseline: 46% Goal status: INITIAL   PLAN:  PT FREQUENCY: 2x/week  PT DURATION: 6 weeks  PLANNED INTERVENTIONS: Therapeutic exercises, Therapeutic activity, Patient/Family education, Self Care, Joint mobilization, Dry Needling, Electrical stimulation, Spinal mobilization, Cryotherapy, Moist heat, Taping, Traction, Ultrasound, Ionotophoresis 4mg /ml Dexamethasone, Manual therapy, and Re-evaluation  PLAN FOR NEXT SESSION: Review FOTO; assess response to HEP; progress therex as indicated; use of modalities, manual therapy; and TPDN as indicated.  Ebenezer Mccaskey MS, PT 12/06/22 3:26 PM  Check all possible CPT codes: 43329 - PT Re-evaluation, 97110- Therapeutic Exercise, 97140 - Manual Therapy, 97530 - Therapeutic Activities, 97535 - Self Care, [phone] - Mechanical traction, [phone] - Electrical stimulation (Manual), and [id number] - Ultrasound    Check all conditions that are expected to impact treatment: {Conditions expected to impact treatment:Morbid obesity and Musculoskeletal disorders   If treatment provided at initial evaluation, no treatment charged due to lack of authorization.

## 2022-12-06 ENCOUNTER — Ambulatory Visit: Payer: Medicaid Other | Attending: Orthopedic Surgery

## 2022-12-06 ENCOUNTER — Other Ambulatory Visit: Payer: Self-pay

## 2022-12-06 DIAGNOSIS — M5412 Radiculopathy, cervical region: Secondary | ICD-10-CM | POA: Diagnosis not present

## 2022-12-06 DIAGNOSIS — R252 Cramp and spasm: Secondary | ICD-10-CM | POA: Insufficient documentation

## 2022-12-06 DIAGNOSIS — R293 Abnormal posture: Secondary | ICD-10-CM | POA: Insufficient documentation

## 2022-12-06 DIAGNOSIS — M542 Cervicalgia: Secondary | ICD-10-CM | POA: Diagnosis not present

## 2022-12-06 NOTE — Telephone Encounter (Signed)
Clinical Rationale:  Your doctor told us that you have neck pain. Your doctor ordered an MRI of your neck. An MRI is a way to take pictures of the inside of your body. This test should be used when the pain has not improved after six weeks of treatment by your doctor. Treatment should include medications and other forms of therapy. These need to include home exercises or physical therapy. We reviewed the notes we have. The notes do not show that you have had at least six weeks of such treatment. The notes do not show that there is a reason you cannot have such treatment. Based on the information we have, this test is not medically necessary. We used [employer] Medical Benefits Management Clinical Guideline titled Imaging of the Spine to make this decision. You may view this guideline at [email or web address].

## 2022-12-06 NOTE — Telephone Encounter (Signed)
Please fax evidence of patient's response to the conservative therapy to [phone] and call [phone] for a peer to peer consultation.

## 2022-12-07 ENCOUNTER — Other Ambulatory Visit: Payer: Self-pay | Admitting: Family Medicine

## 2022-12-09 NOTE — Telephone Encounter (Signed)
Requested medication (s) are due for refill today: routing for approval  Requested medication (s) are on the active medication list: yes  Last refill:  11/19/22  Future visit scheduled: no  Notes to clinic:  Unable to refill per protocol, cannot delegate.      Requested Prescriptions  Pending Prescriptions Disp Refills   HYDROcodone-acetaminophen (NORCO) 7.5-325 MG tablet [Pharmacy Med Name: HYDROCODONE-APAP 7.5-325 MG TAB] 30 tablet     Sig: TAKE ONE (1) TABLET BY MOUTH EVERY 6 HOURS AS NEEDED FOR SEVERE PAIN     Not Delegated - Analgesics:  Opioid Agonist Combinations Failed - 12/07/2022 11:13 AM      Failed - This refill cannot be delegated      Failed - Urine Drug Screen completed in last 360 days      Failed - Valid encounter within last 3 months    Recent Outpatient Visits           1 year ago Essential hypertension   [facility] Pickard, Priscille Heidelberg, MD   1 year ago Essential hypertension   [facility] Donita Brooks, MD   1 year ago Type 2 diabetes mellitus with other specified complication, without long-term current use of insulin (HCC)   [facility] Pickard, Priscille Heidelberg, MD   1 year ago Strain of calf muscle, right, initial encounter   [facility] Donita Brooks, MD   1 year ago Type 2 diabetes mellitus with other specified complication, without long-term current use of insulin (HCC)   [facility] Pickard, Priscille Heidelberg, MD

## 2022-12-11 ENCOUNTER — Ambulatory Visit: Payer: Medicaid Other

## 2022-12-11 DIAGNOSIS — M542 Cervicalgia: Secondary | ICD-10-CM

## 2022-12-11 DIAGNOSIS — R252 Cramp and spasm: Secondary | ICD-10-CM

## 2022-12-11 DIAGNOSIS — R293 Abnormal posture: Secondary | ICD-10-CM

## 2022-12-11 NOTE — Therapy (Signed)
OUTPATIENT PHYSICAL THERAPY CERVICAL EVALUATION   Patient Name: Xavier Daniels MRN: [id number] DOB:1965-06-12, 57 y.o., male Today's Date: 12/12/2022  END OF SESSION:  PT End of Session - 12/12/22 0549     Visit Number 2    Number of Visits 13    Date for PT Re-Evaluation 01/24/23    Authorization Type [place] MEDICAID HEALTHY BLUE    PT Start Time 1150    PT Stop Time 1233    PT Time Calculation (min) 43 min    Activity Tolerance Patient tolerated treatment well    Behavior During Therapy WFL for tasks assessed/performed              Past Medical History:  Diagnosis Date   Arthritis    Colon cancer screening 10/20/2018   Diabetes mellitus without complication (HCC)    Gout    Gout    Hypertension    Substance abuse (HCC)    ETOH, 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, CLEAN FOR 13 YEARS   Past Surgical History:  Procedure Laterality Date   BIOPSY  04/20/2019   Procedure: BIOPSY;  Surgeon: West Bali, MD;  [facility];  Service: Endoscopy;;  transverse polyp x1   COLONOSCOPY WITH PROPOFOL N/A 04/20/2019   Procedure: COLONOSCOPY WITH PROPOFOL;  Surgeon: West Bali, MD;  [facility];  Service: Endoscopy;  Laterality: N/A;  9:30am-office rescheduled 04/20/19 @ 8:30am   POLYPECTOMY  04/20/2019   Procedure: POLYPECTOMY;  Surgeon: West Bali, MD;  [facility];  Service: Endoscopy;;  transversex1, rectal polyp   TOTAL HIP ARTHROPLASTY  2007   due to injury for avascular necrosis.   Patient Active Problem List   Diagnosis Date Noted   Chronic pain of left knee 12/14/2019   Depression, major, single episode, mild (HCC) 12/14/2019   Left ankle pain 12/14/2019   T2DM (type 2 diabetes mellitus) (HCC) 09/10/2019   Essential hypertension 06/06/2014   Hyperlipidemia 06/06/2014   Gout 06/06/2014   Obesity 06/06/2014    PCP: Donita Brooks, MD  REFERRING PROVIDER: London Sheer, MD  REFERRING DIAG: [phone] (ICD-10-CM) - Radiculopathy, cervical  region   THERAPY DIAG:  Cervicalgia  Cramp and spasm  Abnormal posture  Rationale for Evaluation and Treatment: Rehabilitation  ONSET DATE: 3-4 months  SUBJECTIVE:                                                                                                                                                                                                         SUBJECTIVE STATEMENT: Pt  reports he is doing better re: his neck. Pt notes he is completing his HEP regularly  Hand dominance: Right  PERTINENT HISTORY:  DM, high BMI  PAIN:  Are you having pain? Yes: NPRS scale: 0-6/10 range Pain location: L shoulder blade and upper shoulder  Pain description: ache, sharp, intermittent Aggravating factors: lifting. Lying on L side  Relieving factors: Massage gun, massage  Pain range on eval: 0-10/10   PRECAUTIONS: None  RED FLAGS: None     WEIGHT BEARING RESTRICTIONS: No  FALLS:  Has patient fallen in last 6 months? No  LIVING ENVIRONMENT: Lives with: lives with their family Lives in: House/apartment   OCCUPATION: Not working  PLOF: Independent  PATIENT GOALS: Pain relief  NEXT MD VISIT: to contact Dr. Christell Constant after PT  OBJECTIVE:   DIAGNOSTIC FINDINGS:  Imaging: XR of the cervical spine from 08/12/2022 was previously independently reviewed and interpreted, showing early anterior osteophyte formation seen at C4/5.  Disc height loss with anterior osteophyte formation at C5/6.  No fracture or dislocation seen.  No evidence of instability on flexion/extension views.  PATIENT SURVEYS:  FOTO: Perceived function   46%, predicted   58%   COGNITION: Overall cognitive status: Within functional limits for tasks assessed  SENSATION: WFL  POSTURE: forward head and CT step off  PALPATION: TTP to the L upper trap and levator with increased muscle tension   CERVICAL ROM:   Active ROM AROM (deg) eval  Flexion 40 no pain  Extension 40 pain L  Right lateral flexion  20 pulling L  Left lateral flexion 20 pain L  Right rotation 50 no pain  Left rotation 40 no pain    (Blank rows = not tested)  UPPER EXTREMITY ROM:  WNLs and equal Active ROM Right eval Left eval  Shoulder flexion    Shoulder extension    Shoulder abduction    Shoulder adduction    Shoulder extension    Shoulder internal rotation    Shoulder external rotation    Elbow flexion    Elbow extension    Wrist flexion    Wrist extension    Wrist ulnar deviation    Wrist radial deviation    Wrist pronation    Wrist supination     (Blank rows = not tested)  UPPER EXTREMITY MMT: Myotome screen neg, both WNLs and equal MMT Right eval Left eval  Shoulder flexion    Shoulder extension    Shoulder abduction    Shoulder adduction    Shoulder extension    Shoulder internal rotation    Shoulder external rotation    Middle trapezius    Lower trapezius    Elbow flexion    Elbow extension    Wrist flexion    Wrist extension    Wrist ulnar deviation    Wrist radial deviation    Wrist pronation    Wrist supination    Grip strength     (Blank rows = not tested)  CERVICAL SPECIAL TESTS:  Neck flexor muscle endurance test: Negative, Spurling's test: Positive, and Distraction test: Positive  FUNCTIONAL TESTS:  NT  TODAY'S TREATMENT:   [facility] Adult PT Treatment:                                                DATE: 11/2822 Therapeutic Exercise: Supine Cervical Retraction with Towel x10  3" Supine DNF Liftoffs x10 5 Seated cervical rotation x2 15" Seated upper trap stretch x2 15" Standing Passive Cervical Retraction x3-5 3" Standing Shoulder Horizontal Abduction Star Pattern with BluTB x5 reps 3" Standing Shoulder ER with BluTB x5 reps  Updated HEP Manual Therapy: STM c MTPR to the bilat upper traps and cervical paraspinals Manual cervical traction UPAs for C2-C6 Self Care: Instruction and demonstration with return demonstration from the pt for massage using a theracane and  a tennis ball  [facility] Adult PT Treatment:                                                DATE: 12/06/22 Therapeutic Exercise: Developed, instructed in, and pt completed therex as noted in HEP  Self Care: Use of moist heat for symptom relief                                                                                                                            PATIENT EDUCATION:  Education details: Eval findings, POC, HEP, self care  Person educated: Patient Education method: Explanation, Demonstration, Tactile cues, Verbal cues, and Handouts Education comprehension: verbalized understanding, returned demonstration, verbal cues required, tactile cues required, and needs further education  HOME EXERCISE PROGRAM: Access Code: [email or web address] Access Code: [email or web address] URL: [email or web address] Date: 12/11/2022 Prepared by: Joellyn Rued  Exercises - Supine Cervical Retraction with Towel  - 2 x daily - 7 x weekly - 1 sets - 10 reps - 3 hold - Supine DNF Liftoffs  - 2 x daily - 7 x weekly - 1 sets - 10 reps - 5 hold - Seated Passive Cervical Retraction  - 6 x daily - 7 x weekly - 1 sets - 3-5 reps - 3 hold - Standing Shoulder Horizontal Abduction with Resistance  - 2 x daily - 7 x weekly - 1 sets - 5 reps - 3 hold - Shoulder External Rotation and Scapular Retraction with Resistance  - 2 x daily - 7 x weekly - 1 sets - 15 reps - 3 hold - Seated Upper Trapezius Stretch  - 2 x daily - 7 x weekly - 1 sets - 3 reps - 15 hold - Standing Cervical Rotation AROM with Overpressure  - 2 x daily - 7 x weekly - 1 sets - 3 reps - 15 hold  ASSESSMENT:  CLINICAL IMPRESSION:  PT was provided for manual therapy c STM c MTPR, manual traction, and C2-C6 UPAs to address increased muscle tension and cervical mobility. Discussed TPDN with pt, and pt want to see how he responded to massage 1st. Therex was then completed for cervical mobility and postural/posterior chain strengthening. Pt's HEP was updated with  pt demonstrating proper technique. Additionally, instruction and demonstration with return demonstration from the pt for massage using a theracane and a tennis ball was completed.  Pt's initial report of improved neck pain after the 1st PT visit with completion of his HEP was positive. Pt will continue to benefit from skilled PT to address impairments for improved function with minimized pain.  OBJECTIVE IMPAIRMENTS: decreased ROM, increased muscle spasms, impaired UE functional use, postural dysfunction, obesity, and pain.   ACTIVITY LIMITATIONS: carrying, lifting, and reach over head  PARTICIPATION LIMITATIONS: cleaning and yard work  PERSONAL FACTORS: Past/current experiences, Time since onset of injury/illness/exacerbation, and 1-2 comorbidities: DM, high BMI  are also affecting patient's functional outcome.   REHAB POTENTIAL: Good  CLINICAL DECISION MAKING: Evolving/moderate complexity  EVALUATION COMPLEXITY: Moderate   GOALS:  SHORT TERM GOALS: Target date: 12/27/22  Pt will be Ind in an initial HEP Baseline: started Goal status: INITIAL  2.  Pt will voice understanding of measures to assist in pain reduction Baseline: started Goal status: INITIAL  LONG TERM GOALS: Target date: 01/24/23  Pt will be Ind in a final HEP to maintain achieved LOF  Baseline: started Goal status: INITIAL  2.  Increased cervical side bending and rotation by 10d for improved neck function Baseline: see flow sheet Goal status: INITIAL  3.  Pt will demonstrate proper sitting posture Baseline:  Goal status: INITIAL  4.  Pt will reported 75% or greater improvement in pain with daily activities and sleep Baseline: 0-10-10 Goal status: INITIAL  5.  Pt's FOTO score will improved to the predicted value of 58% as indication of improved function  Baseline: 46% Goal status: INITIAL   PLAN:  PT FREQUENCY: 2x/week  PT DURATION: 6 weeks  PLANNED INTERVENTIONS: Therapeutic exercises, Therapeutic  activity, Patient/Family education, Self Care, Joint mobilization, Dry Needling, Electrical stimulation, Spinal mobilization, Cryotherapy, Moist heat, Taping, Traction, Ultrasound, Ionotophoresis 4mg /ml Dexamethasone, Manual therapy, and Re-evaluation  PLAN FOR NEXT SESSION: Review FOTO; assess response to HEP; progress therex as indicated; use of modalities, manual therapy; and TPDN as indicated.  Rakeisha Nyce MS, PT 12/12/22 6:06 AM  Check all possible CPT codes: 78295 - PT Re-evaluation, 97110- Therapeutic Exercise, 97140 - Manual Therapy, 97530 - Therapeutic Activities, 97535 - Self Care, [phone] - Mechanical traction, [phone] - Electrical stimulation (Manual), and [id number] - Ultrasound    Check all conditions that are expected to impact treatment: {Conditions expected to impact treatment:Morbid obesity and Musculoskeletal disorders   If treatment provided at initial evaluation, no treatment charged due to lack of authorization.

## 2022-12-13 ENCOUNTER — Ambulatory Visit: Payer: Medicaid Other | Admitting: Physical Therapy

## 2022-12-17 ENCOUNTER — Ambulatory Visit: Payer: Medicaid Other | Attending: Orthopedic Surgery | Admitting: Physical Therapy

## 2022-12-17 ENCOUNTER — Encounter: Payer: Self-pay | Admitting: Physical Therapy

## 2022-12-17 DIAGNOSIS — R293 Abnormal posture: Secondary | ICD-10-CM | POA: Insufficient documentation

## 2022-12-17 DIAGNOSIS — M542 Cervicalgia: Secondary | ICD-10-CM | POA: Insufficient documentation

## 2022-12-17 DIAGNOSIS — R252 Cramp and spasm: Secondary | ICD-10-CM | POA: Insufficient documentation

## 2022-12-17 NOTE — Therapy (Signed)
OUTPATIENT PHYSICAL THERAPY CERVICAL EVALUATION   Patient Name: Xavier Daniels MRN: [id number] DOB:November 27, 1965, 57 y.o., male Today's Date: 12/17/2022  END OF SESSION:  PT End of Session - 12/17/22 1003     Visit Number 3    Number of Visits 13    Date for PT Re-Evaluation 01/24/23    Authorization Type [place] MEDICAID HEALTHY BLUE    Authorization Time Period 8/26 to 10/24    Authorization - Visit Number 2    Authorization - Number of Visits 7    PT Start Time 1015    PT Stop Time 1100    PT Time Calculation (min) 45 min    Activity Tolerance Patient tolerated treatment well    Behavior During Therapy [facility] for tasks assessed/performed               Past Medical History:  Diagnosis Date   Arthritis    Colon cancer screening 10/20/2018   Diabetes mellitus without complication (HCC)    Gout    Gout    Hypertension    Substance abuse (HCC)    ETOH, 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, CLEAN FOR 13 YEARS   Past Surgical History:  Procedure Laterality Date   BIOPSY  04/20/2019   Procedure: BIOPSY;  Surgeon: West Bali, MD;  [facility];  Service: Endoscopy;;  transverse polyp x1   COLONOSCOPY WITH PROPOFOL N/A 04/20/2019   Procedure: COLONOSCOPY WITH PROPOFOL;  Surgeon: West Bali, MD;  [facility];  Service: Endoscopy;  Laterality: N/A;  9:30am-office rescheduled 04/20/19 @ 8:30am   POLYPECTOMY  04/20/2019   Procedure: POLYPECTOMY;  Surgeon: West Bali, MD;  [facility];  Service: Endoscopy;;  transversex1, rectal polyp   TOTAL HIP ARTHROPLASTY  2007   due to injury for avascular necrosis.   Patient Active Problem List   Diagnosis Date Noted   Chronic pain of left knee 12/14/2019   Depression, major, single episode, mild (HCC) 12/14/2019   Left ankle pain 12/14/2019   T2DM (type 2 diabetes mellitus) (HCC) 09/10/2019   Essential hypertension 06/06/2014   Hyperlipidemia 06/06/2014   Gout 06/06/2014   Obesity 06/06/2014    PCP: Donita Brooks, MD  REFERRING PROVIDER: London Sheer, MD  REFERRING DIAG: [id number] (ICD-10-CM) - Radiculopathy, cervical region   THERAPY DIAG:  Cervicalgia  Cramp and spasm  Abnormal posture  Rationale for Evaluation and Treatment: Rehabilitation  ONSET DATE: 3-4 months  SUBJECTIVE:  SUBJECTIVE STATEMENT: Patient has no pain at rest.  When he sits up straight that improves his pain. He finds himself slumping and tries to correct it. Pain can travel to L ant shoulder but not down his arm.   Hand dominance: Right  PERTINENT HISTORY:  DM, high BMI  PAIN:  Are you having pain? Yes: NPRS scale: 0-6/10 range Pain location: L shoulder blade and upper shoulder  Pain description: ache, sharp, intermittent Aggravating factors: lifting. Lying on L side  Relieving factors: Massage gun, massage  Pain range on eval: 0-10/10   PRECAUTIONS: None  RED FLAGS: None     WEIGHT BEARING RESTRICTIONS: No  FALLS:  Has patient fallen in last 6 months? No  LIVING ENVIRONMENT: Lives with: lives with their family Lives in: House/apartment   OCCUPATION: Not working  PLOF: Independent  PATIENT GOALS: Pain relief  NEXT MD VISIT: to contact Dr. Christell Constant after PT  OBJECTIVE:   DIAGNOSTIC FINDINGS:  Imaging: XR of the cervical spine from 08/12/2022 was previously independently reviewed and interpreted, showing early anterior osteophyte formation seen at C4/5.  Disc height loss with anterior osteophyte formation at C5/6.  No fracture or dislocation seen.  No evidence of instability on flexion/extension views.  PATIENT SURVEYS:  FOTO: Perceived function   46%, predicted   58%   COGNITION: Overall cognitive status: Within functional limits for tasks assessed  SENSATION: WFL  POSTURE:  forward head and CT step off  PALPATION: TTP to the L upper trap and levator with increased muscle tension   CERVICAL ROM:   Active ROM AROM (deg) eval  Flexion 40 no pain  Extension 40 pain L  Right lateral flexion 20 pulling L  Left lateral flexion 20 pain L  Right rotation 50 no pain  Left rotation 40 no pain    (Blank rows = not tested)  UPPER EXTREMITY ROM:  WNLs and equal Active ROM Right eval Left eval  Shoulder flexion    Shoulder extension    Shoulder abduction    Shoulder adduction    Shoulder extension    Shoulder internal rotation    Shoulder external rotation    Elbow flexion    Elbow extension    Wrist flexion    Wrist extension    Wrist ulnar deviation    Wrist radial deviation    Wrist pronation    Wrist supination     (Blank rows = not tested)  UPPER EXTREMITY MMT: Myotome screen neg, both WNLs and equal MMT Right eval Left eval  Shoulder flexion    Shoulder extension    Shoulder abduction    Shoulder adduction    Shoulder extension    Shoulder internal rotation    Shoulder external rotation    Middle trapezius    Lower trapezius    Elbow flexion    Elbow extension    Wrist flexion    Wrist extension    Wrist ulnar deviation    Wrist radial deviation    Wrist pronation    Wrist supination    Grip strength     (Blank rows = not tested)  CERVICAL SPECIAL TESTS:  Neck flexor muscle endurance test: Negative, Spurling's test: Positive, and Distraction test: Positive  FUNCTIONAL TESTS:  NT  TODAY'S TREATMENT:    [facility] Adult PT Treatment:  DATE: 12/17/22 Therapeutic Exercise: UBE 5 min Level 1 , 2.5 min each direction  Supine blue band overhead lift and horizontal abduction x 10-15 each  Diagonal pull x 10 blue  Standing: corner stretch, goal post open/close and wall angels  Seated cervical extension with towel  Upper trap stretch with towel  Manual Therapy: Cervical distraction, soft  tissue work to bilateral posterior cervicals  [facility] Adult PT Treatment:                                                DATE: 11/2822 Therapeutic Exercise: Supine Cervical Retraction with Towel x10 3" Supine DNF Liftoffs x10 5 Seated cervical rotation x2 15" Seated upper trap stretch x2 15" Standing Passive Cervical Retraction x3-5 3" Standing Shoulder Horizontal Abduction Star Pattern with BluTB x5 reps 3" Standing Shoulder ER with BluTB x5 reps  Updated HEP Manual Therapy: STM c MTPR to the bilat upper traps and cervical paraspinals Manual cervical traction UPAs for C2-C6 Self Care: Instruction and demonstration with return demonstration from the pt for massage using a theracane and a tennis ball  [facility] Adult PT Treatment:                                                DATE: 12/06/22 Therapeutic Exercise: Developed, instructed in, and pt completed therex as noted in HEP  Self Care: Use of moist heat for symptom relief                                                                                                                            PATIENT EDUCATION:  Education details: Eval findings, POC, HEP, self care  Person educated: Patient Education method: Explanation, Demonstration, Tactile cues, Verbal cues, and Handouts Education comprehension: verbalized understanding, returned demonstration, verbal cues required, tactile cues required, and needs further education  HOME EXERCISE PROGRAM: Access Code: [email or web address] Access Code: [email or web address] URL: [email or web address] Date: 12/11/2022 Prepared by: Joellyn Rued  Exercises - Supine Cervical Retraction with Towel  - 2 x daily - 7 x weekly - 1 sets - 10 reps - 3 hold - Supine DNF Liftoffs  - 2 x daily - 7 x weekly - 1 sets - 10 reps - 5 hold - Seated Passive Cervical Retraction  - 6 x daily - 7 x weekly - 1 sets - 3-5 reps - 3 hold - Standing Shoulder Horizontal Abduction with Resistance  - 2 x daily - 7 x weekly - 1 sets - 5  reps - 3 hold - Shoulder External Rotation and Scapular Retraction with Resistance  - 2 x daily - 7 x weekly - 1 sets - 15 reps - 3 hold - Seated Upper Trapezius Stretch  -  2 x daily - 7 x weekly - 1 sets - 3 reps - 15 hold - Standing Cervical Rotation AROM with Overpressure  - 2 x daily - 7 x weekly - 1 sets - 3 reps - 15 hold  ASSESSMENT:  CLINICAL IMPRESSION: Patient overall having less pain in neck and L shoulder.  He was able to perform exercises today without pain as well.  Worked on postural strengthening in standing, added corner strength to program. Pt will continue to benefit from skilled PT to address impairments for improved function with minimized pain.  OBJECTIVE IMPAIRMENTS: decreased ROM, increased muscle spasms, impaired UE functional use, postural dysfunction, obesity, and pain.   ACTIVITY LIMITATIONS: carrying, lifting, and reach over head  PARTICIPATION LIMITATIONS: cleaning and yard work  PERSONAL FACTORS: Past/current experiences, Time since onset of injury/illness/exacerbation, and 1-2 comorbidities: DM, high BMI  are also affecting patient's functional outcome.   REHAB POTENTIAL: Good  CLINICAL DECISION MAKING: Evolving/moderate complexity  EVALUATION COMPLEXITY: Moderate   GOALS:  SHORT TERM GOALS: Target date: 12/27/22  Pt will be Ind in an initial HEP Baseline: started Goal status: INITIAL  2.  Pt will voice understanding of measures to assist in pain reduction Baseline: started Goal status: INITIAL  LONG TERM GOALS: Target date: 01/24/23  Pt will be Ind in a final HEP to maintain achieved LOF  Baseline: started Goal status: INITIAL  2.  Increased cervical side bending and rotation by 10d for improved neck function Baseline: see flow sheet Goal status: INITIAL  3.  Pt will demonstrate proper sitting posture Baseline:  Goal status: INITIAL  4.  Pt will reported 75% or greater improvement in pain with daily activities and sleep Baseline:  0-10-10 Goal status: INITIAL  5.  Pt's FOTO score will improved to the predicted value of 58% as indication of improved function  Baseline: 46% Goal status: INITIAL   PLAN:  PT FREQUENCY: 2x/week  PT DURATION: 6 weeks  PLANNED INTERVENTIONS: Therapeutic exercises, Therapeutic activity, Patient/Family education, Self Care, Joint mobilization, Dry Needling, Electrical stimulation, Spinal mobilization, Cryotherapy, Moist heat, Taping, Traction, Ultrasound, Ionotophoresis 4mg /ml Dexamethasone, Manual therapy, and Re-evaluation  PLAN FOR NEXT SESSION: Review FOTO; assess response to HEP; progress therex as indicated; use of modalities, manual therapy; and TPDN as indicated.  Karie Mainland, PT 12/17/22 11:10 AM Phone: [phone] Fax: [phone]

## 2022-12-19 ENCOUNTER — Ambulatory Visit: Payer: Medicaid Other | Admitting: Physical Therapy

## 2022-12-19 ENCOUNTER — Other Ambulatory Visit: Payer: Self-pay | Admitting: Family Medicine

## 2022-12-20 ENCOUNTER — Telehealth: Payer: Self-pay

## 2022-12-20 NOTE — Telephone Encounter (Signed)
Pt requests a new prescription of:  Prescription Request  12/20/2022  LOV: 09/24/22  What is the name of the medication or equipment? HYDROcodone-acetaminophen (NORCO) 7.5-325 MG tablet [[id number]]   Have you contacted your pharmacy to request a refill? Yes   Which pharmacy would you like this sent to?  [facility] - [place], [place] - [address] [place] [address] Phone: [phone] Fax: [phone]    Patient notified that their request is being sent to the clinical staff for review and that they should receive a response within 2 business days.   Please advise at Mobile [phone] (mobile)

## 2022-12-20 NOTE — Telephone Encounter (Signed)
Requested medication (s) are due for refill today: Yes  Requested medication (s) are on the active medication list: Yes  Last refill:  11/19/22 #30, 0RF  Future visit scheduled: No  Notes to clinic:  Unable to refill per protocol, cannot delegate.      Requested Prescriptions  Pending Prescriptions Disp Refills   HYDROcodone-acetaminophen (NORCO) 7.5-325 MG tablet [Pharmacy Med Name: HYDROCODONE-APAP 7.5-325 MG TAB] 30 tablet     Sig: TAKE ONE (1) TABLET BY MOUTH EVERY 6 HOURS AS NEEDED FOR SEVERE PAIN     Not Delegated - Analgesics:  Opioid Agonist Combinations Failed - 12/19/2022  2:04 PM      Failed - This refill cannot be delegated      Failed - Urine Drug Screen completed in last 360 days      Failed - Valid encounter within last 3 months    Recent Outpatient Visits           1 year ago Essential hypertension   [facility] [place], Priscille Heidelberg, MD   1 year ago Essential hypertension   [facility] Donita Brooks, MD   1 year ago Type 2 diabetes mellitus with other specified complication, without long-term current use of insulin (HCC)   [facility] [place], Priscille Heidelberg, MD   1 year ago Strain of calf muscle, right, initial encounter   [facility] Donita Brooks, MD   1 year ago Type 2 diabetes mellitus with other specified complication, without long-term current use of insulin (HCC)   [facility] [place], Priscille Heidelberg, MD

## 2022-12-25 ENCOUNTER — Encounter: Payer: Self-pay | Admitting: Physical Therapy

## 2022-12-25 ENCOUNTER — Ambulatory Visit: Payer: Medicaid Other | Admitting: Physical Therapy

## 2022-12-25 DIAGNOSIS — R252 Cramp and spasm: Secondary | ICD-10-CM

## 2022-12-25 DIAGNOSIS — M542 Cervicalgia: Secondary | ICD-10-CM | POA: Diagnosis not present

## 2022-12-25 NOTE — Therapy (Signed)
OUTPATIENT PHYSICAL THERAPY CERVICAL TREATMENT   Patient Name: Xavier Daniels MRN: [id number] DOB:18-Sep-1965, 57 y.o., male Today's Date: 12/25/2022  END OF SESSION:  PT End of Session - 12/25/22 0929     Visit Number 4    Number of Visits 13    Date for PT Re-Evaluation 01/24/23    Authorization Type [place] MEDICAID HEALTHY BLUE    Authorization Time Period 8/26 to 10/24    Authorization - Visit Number 3    Authorization - Number of Visits 7    PT Start Time 0930    PT Stop Time 1011    PT Time Calculation (min) 41 min               Past Medical History:  Diagnosis Date   Arthritis    Colon cancer screening 10/20/2018   Diabetes mellitus without complication (HCC)    Gout    Gout    Hypertension    Substance abuse (HCC)    ETOH, 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, CLEAN FOR 13 YEARS   Past Surgical History:  Procedure Laterality Date   BIOPSY  04/20/2019   Procedure: BIOPSY;  Surgeon: West Bali, MD;  [facility];  Service: Endoscopy;;  transverse polyp x1   COLONOSCOPY WITH PROPOFOL N/A 04/20/2019   Procedure: COLONOSCOPY WITH PROPOFOL;  Surgeon: West Bali, MD;  [facility]: [facility];  Service: Endoscopy;  Laterality: N/A;  9:30am-office rescheduled 04/20/19 @ 8:30am   POLYPECTOMY  04/20/2019   Procedure: POLYPECTOMY;  Surgeon: West Bali, MD;  Location: [facility];  Service: Endoscopy;;  transversex1, rectal polyp   TOTAL HIP ARTHROPLASTY  2007   due to injury for avascular necrosis.   Patient Active Problem List   Diagnosis Date Noted   Chronic pain of left knee 12/14/2019   Depression, major, single episode, mild (HCC) 12/14/2019   Left ankle pain 12/14/2019   T2DM (type 2 diabetes mellitus) (HCC) 09/10/2019   Essential hypertension 06/06/2014   Hyperlipidemia 06/06/2014   Gout 06/06/2014   Obesity 06/06/2014    PCP: Donita Brooks, MD  REFERRING PROVIDER: London Sheer, MD  REFERRING DIAG: [phone] (ICD-10-CM) - Radiculopathy, cervical  region   THERAPY DIAG:  Cervicalgia  Cramp and spasm  Rationale for Evaluation and Treatment: Rehabilitation  ONSET DATE: 3-4 months  SUBJECTIVE:  SUBJECTIVE STATEMENT: Had some discomfort last night. 4/10 left sided neck pain now. Neck will feel better for a couple of days after PT. Pain comes and goes.    Hand dominance: Right  PERTINENT HISTORY:  DM, high BMI  PAIN:  Are you having pain? Yes: NPRS scale: 0-6/10 range Pain location: L shoulder blade and upper shoulder  Pain description: ache, sharp, intermittent Aggravating factors: lifting. Lying on L side  Relieving factors: Massage gun, massage  Pain range on eval: 0-10/10   PRECAUTIONS: None  RED FLAGS: None     WEIGHT BEARING RESTRICTIONS: No  FALLS:  Has patient fallen in last 6 months? No  LIVING ENVIRONMENT: Lives with: lives with their family Lives in: House/apartment   OCCUPATION: Not working  PLOF: Independent  PATIENT GOALS: Pain relief  NEXT MD VISIT: to contact Dr. Christell Constant after PT  OBJECTIVE:   DIAGNOSTIC FINDINGS:  Imaging: XR of the cervical spine from 08/12/2022 was previously independently reviewed and interpreted, showing early anterior osteophyte formation seen at C4/5.  Disc height loss with anterior osteophyte formation at C5/6.  No fracture or dislocation seen.  No evidence of instability on flexion/extension views.  PATIENT SURVEYS:  FOTO: Perceived function   46%, predicted   58%   COGNITION: Overall cognitive status: Within functional limits for tasks assessed  SENSATION: WFL  POSTURE: forward head and CT step off  PALPATION: TTP to the L upper trap and levator with increased muscle tension   CERVICAL ROM:   Active ROM AROM (deg) eval AROM  12/25/22  Flexion 40 no  pain   Extension 40 pain L   Right lateral flexion 20 pulling L 30 pulling L  Left lateral flexion 20 pain L 35  Right rotation 50 no pain 58  Left rotation 40 no pain  58   (Blank rows = not tested)  UPPER EXTREMITY ROM:  WNLs and equal Active ROM Right eval Left eval  Shoulder flexion    Shoulder extension    Shoulder abduction    Shoulder adduction    Shoulder extension    Shoulder internal rotation    Shoulder external rotation    Elbow flexion    Elbow extension    Wrist flexion    Wrist extension    Wrist ulnar deviation    Wrist radial deviation    Wrist pronation    Wrist supination     (Blank rows = not tested)  UPPER EXTREMITY MMT: Myotome screen neg, both WNLs and equal MMT Right eval Left eval  Shoulder flexion    Shoulder extension    Shoulder abduction    Shoulder adduction    Shoulder extension    Shoulder internal rotation    Shoulder external rotation    Middle trapezius    Lower trapezius    Elbow flexion    Elbow extension    Wrist flexion    Wrist extension    Wrist ulnar deviation    Wrist radial deviation    Wrist pronation    Wrist supination    Grip strength     (Blank rows = not tested)  CERVICAL SPECIAL TESTS:  Neck flexor muscle endurance test: Negative, Spurling's test: Positive, and Distraction test: Positive  FUNCTIONAL TESTS:  NT  TODAY'S TREATMENT:   [facility] Adult PT Treatment:  DATE: 12/25/22 Therapeutic Exercise: UBE level 3 2.5 min each way  Black band row x 20 Doorway stretch Open books at wall x 5 each  Chin tuck on wall  Standing horizontal, diagonals and ER to fatigue , blue band  Seated thoracic ext foam roller in chair , hands behind head Manual Therapy: Seated and STW to upper traps , cervical paraspinals , TPR x 1 left upper trap  [facility] Adult PT Treatment:                                                DATE: 12/17/22 Therapeutic Exercise: UBE 5 min Level 1 ,  2.5 min each direction  Supine blue band overhead lift and horizontal abduction x 10-15 each  Diagonal pull x 10 blue  Standing: corner stretch, goal post open/close and wall angels  Seated cervical extension with towel  Upper trap stretch with towel  Manual Therapy: Cervical distraction, soft tissue work to bilateral posterior cervicals  [facility] Adult PT Treatment:                                                DATE: 11/2822 Therapeutic Exercise: Supine Cervical Retraction with Towel x10 3" Supine DNF Liftoffs x10 5 Seated cervical rotation x2 15" Seated upper trap stretch x2 15" Standing Passive Cervical Retraction x3-5 3" Standing Shoulder Horizontal Abduction Star Pattern with BluTB x5 reps 3" Standing Shoulder ER with BluTB x5 reps  Updated HEP Manual Therapy: STM c MTPR to the bilat upper traps and cervical paraspinals Manual cervical traction UPAs for C2-C6 Self Care: Instruction and demonstration with return demonstration from the pt for massage using a theracane and a tennis ball  [facility] Adult PT Treatment:                                                DATE: 12/06/22 Therapeutic Exercise: Developed, instructed in, and pt completed therex as noted in HEP  Self Care: Use of moist heat for symptom relief                                                                                                                            PATIENT EDUCATION:  Education details: Eval findings, POC, HEP, self care  Person educated: Patient Education method: Explanation, Demonstration, Tactile cues, Verbal cues, and Handouts Education comprehension: verbalized understanding, returned demonstration, verbal cues required, tactile cues required, and needs further education  HOME EXERCISE PROGRAM: Access Code: [email or web address] URL: [email or web address] Date: 12/11/2022 Prepared by: Joellyn Rued  Exercises - Supine Cervical Retraction with Towel  -  2 x daily - 7 x weekly - 1 sets - 10  reps - 3 hold - Supine DNF Liftoffs  - 2 x daily - 7 x weekly - 1 sets - 10 reps - 5 hold - Seated Passive Cervical Retraction  - 6 x daily - 7 x weekly - 1 sets - 3-5 reps - 3 hold - Standing Shoulder Horizontal Abduction with Resistance  - 2 x daily - 7 x weekly - 1 sets - 5 reps - 3 hold - Shoulder External Rotation and Scapular Retraction with Resistance  - 2 x daily - 7 x weekly - 1 sets - 15 reps - 3 hold - Seated Upper Trapezius Stretch  - 2 x daily - 7 x weekly - 1 sets - 3 reps - 15 hold - Standing Cervical Rotation AROM with Overpressure  - 2 x daily - 7 x weekly - 1 sets - 3 reps - 15 hold  ASSESSMENT:  CLINICAL IMPRESSION: Patient overall having less pain in neck and L shoulder however pain can still reach 6-10/10. Some discomfort with sleep last night, feels pretty good on arrival today. Check cervical ROM with pt demonstrating improve ROM by 8+ degrees bilateral side bend and rotation, nearly meeting LTG.  He was able to perform exercises today without pain as well, reporting fatigue.   Worked on postural strengthening in standing. Began thoracic extension with foam roller. Completed manual STW to neck and upper traps with 1 TPR on left. At end of session he reported neck felt much better and more flexible. He is interested in TPDN next week.  Pt will continue to benefit from skilled PT to address impairments for improved function with minimized pain.  OBJECTIVE IMPAIRMENTS: decreased ROM, increased muscle spasms, impaired UE functional use, postural dysfunction, obesity, and pain.   ACTIVITY LIMITATIONS: carrying, lifting, and reach over head  PARTICIPATION LIMITATIONS: cleaning and yard work  PERSONAL FACTORS: Past/current experiences, Time since onset of injury/illness/exacerbation, and 1-2 comorbidities: DM, high BMI  are also affecting patient's functional outcome.   REHAB POTENTIAL: Good  CLINICAL DECISION MAKING: Evolving/moderate complexity  EVALUATION COMPLEXITY:  Moderate   GOALS:  SHORT TERM GOALS: Target date: 12/27/22  Pt will be Ind in an initial HEP Baseline: started Goal status: MET   2.  Pt will voice understanding of measures to assist in pain reduction Baseline: started 12/25/22: uses tennis ball, heat, stretches, posture  Goal status: MET  LONG TERM GOALS: Target date: 01/24/23  Pt will be Ind in a final HEP to maintain achieved LOF  Baseline: started Goal status: INITIAL  2.  Increased cervical side bending and rotation by 10d for improved neck function Baseline: see flow sheet 12/25/22: see chart met 3 of 4 motions Goal status: NEARLY MET   3.  Pt will demonstrate proper sitting posture Baseline:  Goal status: INITIAL  4.  Pt will reported 75% or greater improvement in pain with daily activities and sleep Baseline: 0-10-10 Goal status: INITIAL  5.  Pt's FOTO score will improved to the predicted value of 58% as indication of improved function  Baseline: 46% Goal status: INITIAL   PLAN:  PT FREQUENCY: 2x/week  PT DURATION: 6 weeks  PLANNED INTERVENTIONS: Therapeutic exercises, Therapeutic activity, Patient/Family education, Self Care, Joint mobilization, Dry Needling, Electrical stimulation, Spinal mobilization, Cryotherapy, Moist heat, Taping, Traction, Ultrasound, Ionotophoresis 4mg /ml Dexamethasone, Manual therapy, and Re-evaluation  PLAN FOR NEXT SESSION: TPDN next visit with Freida Busman. Review FOTO; assess response to HEP; progress therex as  indicated; use of modalities, manual therapy; and TPDN as indicated.  Jannette Spanner, PTA 12/25/22 10:25 AM Phone: [phone] Fax: [phone]

## 2022-12-26 ENCOUNTER — Ambulatory Visit: Payer: Medicaid Other | Admitting: Physical Therapy

## 2022-12-26 ENCOUNTER — Encounter: Payer: Self-pay | Admitting: Physical Therapy

## 2022-12-26 DIAGNOSIS — R293 Abnormal posture: Secondary | ICD-10-CM

## 2022-12-26 DIAGNOSIS — R252 Cramp and spasm: Secondary | ICD-10-CM

## 2022-12-26 DIAGNOSIS — M542 Cervicalgia: Secondary | ICD-10-CM

## 2022-12-26 NOTE — Therapy (Signed)
OUTPATIENT PHYSICAL THERAPY CERVICAL TREATMENT   Patient Name: Xavier Daniels MRN: [id number] DOB:[birth year], 57 y.o., male Today's Date: 12/26/2022  END OF SESSION:  PT End of Session - 12/26/22 0847     Visit Number 5    Number of Visits 13    Date for PT Re-Evaluation 01/24/23    Authorization Type [place] MEDICAID HEALTHY BLUE    Authorization Time Period 8/26 to 10/24    Authorization - Visit Number 4    Authorization - Number of Visits 7    PT Start Time 0843    PT Stop Time 0931    PT Time Calculation (min) 48 min               Past Medical History:  Diagnosis Date   Arthritis    Colon cancer screening 10/20/2018   Diabetes mellitus without complication (HCC)    Gout    Gout    Hypertension    Substance abuse (HCC)    ETOH, 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, CLEAN FOR 13 YEARS   Past Surgical History:  Procedure Laterality Date   BIOPSY  04/20/2019   Procedure: BIOPSY;  Surgeon: West Bali, MD;  [facility];  Service: Endoscopy;;  transverse polyp x1   COLONOSCOPY WITH PROPOFOL N/A 04/20/2019   Procedure: COLONOSCOPY WITH PROPOFOL;  Surgeon: West Bali, MD;  [facility];  Service: Endoscopy;  Laterality: N/A;  9:30am-office rescheduled 04/20/19 @ 8:30am   POLYPECTOMY  04/20/2019   Procedure: POLYPECTOMY;  Surgeon: West Bali, MD;  [facility]: [facility];  Service: Endoscopy;;  transversex1, rectal polyp   TOTAL HIP ARTHROPLASTY  2007   due to injury for avascular necrosis.   Patient Active Problem List   Diagnosis Date Noted   Chronic pain of left knee 12/14/2019   Depression, major, single episode, mild (HCC) 12/14/2019   Left ankle pain 12/14/2019   T2DM (type 2 diabetes mellitus) (HCC) 09/10/2019   Essential hypertension 06/06/2014   Hyperlipidemia 06/06/2014   Gout 06/06/2014   Obesity 06/06/2014    PCP: Donita Brooks, MD  REFERRING PROVIDER: London Sheer, MD  REFERRING DIAG: [phone] (ICD-10-CM) - Radiculopathy, cervical  region   THERAPY DIAG:  Cervicalgia  Cramp and spasm  Abnormal posture  Rationale for Evaluation and Treatment: Rehabilitation  ONSET DATE: 3-4 months  SUBJECTIVE:  SUBJECTIVE STATEMENT: Had some tightness but not right now. 0/10 pain at the moment.  left sided neck pain now. Neck will feel better for a couple of days after PT. Pain comes and goes.    Hand dominance: Right  PERTINENT HISTORY:  DM, high BMI  PAIN:  Are you having pain? no: NPRS scale: 0/10  Pain location: L shoulder blade and upper shoulder  Pain description: ache, sharp, intermittent Aggravating factors: lifting. Lying on L side  Relieving factors: Massage gun, massage  Pain range on eval: 0-10/10   PRECAUTIONS: None  RED FLAGS: None     WEIGHT BEARING RESTRICTIONS: No  FALLS:  Has patient fallen in last 6 months? No  LIVING ENVIRONMENT: Lives with: lives with their family Lives in: House/apartment   OCCUPATION: Not working  PLOF: Independent  PATIENT GOALS: Pain relief  NEXT MD VISIT: to contact Dr. Christell Constant after PT  OBJECTIVE:   DIAGNOSTIC FINDINGS:  Imaging: XR of the cervical spine from 08/12/2022 was previously independently reviewed and interpreted, showing early anterior osteophyte formation seen at C4/5.  Disc height loss with anterior osteophyte formation at C5/6.  No fracture or dislocation seen.  No evidence of instability on flexion/extension views.  PATIENT SURVEYS:  FOTO: Perceived function   46%, predicted   58%   COGNITION: Overall cognitive status: Within functional limits for tasks assessed  SENSATION: WFL  POSTURE: forward head and CT step off  PALPATION: TTP to the L upper trap and levator with increased muscle tension   CERVICAL ROM:   Active ROM AROM  (deg) eval AROM  12/25/22  Flexion 40 no pain   Extension 40 pain L   Right lateral flexion 20 pulling L 30 pulling L  Left lateral flexion 20 pain L 35  Right rotation 50 no pain 58  Left rotation 40 no pain  58   (Blank rows = not tested)  UPPER EXTREMITY ROM:  WNLs and equal Active ROM Right eval Left eval  Shoulder flexion    Shoulder extension    Shoulder abduction    Shoulder adduction    Shoulder extension    Shoulder internal rotation    Shoulder external rotation    Elbow flexion    Elbow extension    Wrist flexion    Wrist extension    Wrist ulnar deviation    Wrist radial deviation    Wrist pronation    Wrist supination     (Blank rows = not tested)  UPPER EXTREMITY MMT: Myotome screen neg, both WNLs and equal MMT Right eval Left eval  Shoulder flexion    Shoulder extension    Shoulder abduction    Shoulder adduction    Shoulder extension    Shoulder internal rotation    Shoulder external rotation    Middle trapezius    Lower trapezius    Elbow flexion    Elbow extension    Wrist flexion    Wrist extension    Wrist ulnar deviation    Wrist radial deviation    Wrist pronation    Wrist supination    Grip strength     (Blank rows = not tested)  CERVICAL SPECIAL TESTS:  Neck flexor muscle endurance test: Negative, Spurling's test: Positive, and Distraction test: Positive  FUNCTIONAL TESTS:  NT  TODAY'S TREATMENT:   [facility] Adult PT Treatment:  DATE: 12/26/22 Therapeutic Exercise: UBE level 3 2.5 min each way  Black band row x 20  Open books at wall x 5 each way Standing chin tuck into towel x 10 Standing green band pullovers with isometric hold x 10  Standing bilateral ER to fatigue Seated thoracic ext foam roller in chair , hands behind head Supine chin tuck over towel roll 5 sec x 10  Manual Therapy: Bil upper trap TPR x 3 Passive upper trap stretch bilat Cervical  distraction  Modalities: HMP x 15 min cervical     [facility] Adult PT Treatment:                                                DATE: 12/25/22 Therapeutic Exercise: UBE level 3 2.5 min each way  Black band row x 20 Doorway stretch Open books at wall x 5 each  Chin tuck on wall  Standing horizontal, diagonals and ER to fatigue , blue band  Seated thoracic ext foam roller in chair , hands behind head Manual Therapy: Seated and STW to upper traps , cervical paraspinals , TPR x 1 left upper trap  [facility] Adult PT Treatment:                                                DATE: 12/17/22 Therapeutic Exercise: UBE 5 min Level 1 , 2.5 min each direction  Supine blue band overhead lift and horizontal abduction x 10-15 each  Diagonal pull x 10 blue  Standing: corner stretch, goal post open/close and wall angels  Seated cervical extension with towel  Upper trap stretch with towel  Manual Therapy: Cervical distraction, soft tissue work to bilateral posterior cervicals  [facility] Adult PT Treatment:                                                DATE: 11/2822 Therapeutic Exercise: Supine Cervical Retraction with Towel x10 3" Supine DNF Liftoffs x10 5 Seated cervical rotation x2 15" Seated upper trap stretch x2 15" Standing Passive Cervical Retraction x3-5 3" Standing Shoulder Horizontal Abduction Star Pattern with BluTB x5 reps 3" Standing Shoulder ER with BluTB x5 reps  Updated HEP Manual Therapy: STM c MTPR to the bilat upper traps and cervical paraspinals Manual cervical traction UPAs for C2-C6 Self Care: Instruction and demonstration with return demonstration from the pt for massage using a theracane and a tennis ball  [facility] Adult PT Treatment:                                                DATE: 12/06/22 Therapeutic Exercise: Developed, instructed in, and pt completed therex as noted in HEP  Self Care: Use of moist heat for symptom relief  PATIENT EDUCATION:  Education details: Eval findings, POC, HEP, self care  Person educated: Patient Education method: Explanation, Demonstration, Tactile cues, Verbal cues, and Handouts Education comprehension: verbalized understanding, returned demonstration, verbal cues required, tactile cues required, and needs further education  HOME EXERCISE PROGRAM: Access Code: [email or web address] URL: [email or web address] Date: 12/11/2022 Prepared by: Joellyn Rued  Exercises - Supine Cervical Retraction with Towel  - 2 x daily - 7 x weekly - 1 sets - 10 reps - 3 hold - Supine DNF Liftoffs  - 2 x daily - 7 x weekly - 1 sets - 10 reps - 5 hold - Seated Passive Cervical Retraction  - 6 x daily - 7 x weekly - 1 sets - 3-5 reps - 3 hold - Standing Shoulder Horizontal Abduction with Resistance  - 2 x daily - 7 x weekly - 1 sets - 5 reps - 3 hold - Shoulder External Rotation and Scapular Retraction with Resistance  - 2 x daily - 7 x weekly - 1 sets - 15 reps - 3 hold - Seated Upper Trapezius Stretch  - 2 x daily - 7 x weekly - 1 sets - 3 reps - 15 hold - Standing Cervical Rotation AROM with Overpressure  - 2 x daily - 7 x weekly - 1 sets - 3 reps - 15 hold  ASSESSMENT:  CLINICAL IMPRESSION: Patient overall having less pain in neck and L shoulder however pain can still reach 6-10/10. Some discomfort with sleep last night, feels pretty good on arrival today. Check cervical ROM with pt demonstrating improve ROM by 8+ degrees bilateral side bend and rotation, nearly meeting LTG.  He was able to perform exercises today without pain as well, reporting fatigue.   Worked on postural strengthening in standing. Began thoracic extension with foam roller. Completed manual STW to neck and upper traps with 1 TPR on left. At end of session he reported neck felt much better and more flexible. He is interested in TPDN next week.  Pt will continue to  benefit from skilled PT to address impairments for improved function with minimized pain.  OBJECTIVE IMPAIRMENTS: decreased ROM, increased muscle spasms, impaired UE functional use, postural dysfunction, obesity, and pain.   ACTIVITY LIMITATIONS: carrying, lifting, and reach over head  PARTICIPATION LIMITATIONS: cleaning and yard work  PERSONAL FACTORS: Past/current experiences, Time since onset of injury/illness/exacerbation, and 1-2 comorbidities: DM, high BMI  are also affecting patient's functional outcome.   REHAB POTENTIAL: Good  CLINICAL DECISION MAKING: Evolving/moderate complexity  EVALUATION COMPLEXITY: Moderate   GOALS:  SHORT TERM GOALS: Target date: 12/27/22  Pt will be Ind in an initial HEP Baseline: started Goal status: MET   2.  Pt will voice understanding of measures to assist in pain reduction Baseline: started 12/25/22: uses tennis ball, heat, stretches, posture  Goal status: MET  LONG TERM GOALS: Target date: 01/24/23  Pt will be Ind in a final HEP to maintain achieved LOF  Baseline: started Goal status: INITIAL  2.  Increased cervical side bending and rotation by 10d for improved neck function Baseline: see flow sheet 12/25/22: see chart met 3 of 4 motions Goal status: NEARLY MET   3.  Pt will demonstrate proper sitting posture Baseline:  Goal status: INITIAL  4.  Pt will reported 75% or greater improvement in pain with daily activities and sleep Baseline: 0-10-10 Goal status: INITIAL  5.  Pt's FOTO score will improved to the predicted value of 58% as indication of improved function  Baseline: 46% Goal  status: INITIAL   PLAN:  PT FREQUENCY: 2x/week  PT DURATION: 6 weeks  PLANNED INTERVENTIONS: Therapeutic exercises, Therapeutic activity, Patient/Family education, Self Care, Joint mobilization, Dry Needling, Electrical stimulation, Spinal mobilization, Cryotherapy, Moist heat, Taping, Traction, Ultrasound, Ionotophoresis 4mg /ml  Dexamethasone, Manual therapy, and Re-evaluation  PLAN FOR NEXT SESSION: TPDN next visit with Freida Busman. Review FOTO; assess response to HEP; progress therex as indicated; use of modalities, manual therapy; and TPDN as indicated.  Jannette Spanner, PTA 12/26/22 10:38 AM Phone: [phone] Fax: [phone]

## 2022-12-30 NOTE — Therapy (Signed)
OUTPATIENT PHYSICAL THERAPY CERVICAL TREATMENT   Patient Name: Xavier Daniels MRN: [id number] DOB:09/29/1965, 57 y.o., male Today's Date: 12/31/2022  END OF SESSION:  PT End of Session - 12/31/22 0854     Visit Number 6    Number of Visits 13    Date for PT Re-Evaluation 01/24/23    Authorization Type [place] MEDICAID HEALTHY BLUE    Authorization Time Period 8/26 to 10/24    Authorization - Visit Number 5    Authorization - Number of Visits 7    PT Start Time 0850    PT Stop Time 0933    PT Time Calculation (min) 43 min    Activity Tolerance Patient tolerated treatment well    Behavior During Therapy [facility] for tasks assessed/performed                Past Medical History:  Diagnosis Date   Arthritis    Colon cancer screening 10/20/2018   Diabetes mellitus without complication (HCC)    Gout    Gout    Hypertension    Substance abuse (HCC)    ETOH, 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, CLEAN FOR 13 YEARS   Past Surgical History:  Procedure Laterality Date   BIOPSY  04/20/2019   Procedure: BIOPSY;  Surgeon: West Bali, MD;  [facility];  Service: Endoscopy;;  transverse polyp x1   COLONOSCOPY WITH PROPOFOL N/A 04/20/2019   Procedure: COLONOSCOPY WITH PROPOFOL;  Surgeon: West Bali, MD;  [facility];  Service: Endoscopy;  Laterality: N/A;  9:30am-office rescheduled 04/20/19 @ 8:30am   POLYPECTOMY  04/20/2019   Procedure: POLYPECTOMY;  Surgeon: West Bali, MD;  [facility];  Service: Endoscopy;;  transversex1, rectal polyp   TOTAL HIP ARTHROPLASTY  2007   due to injury for avascular necrosis.   Patient Active Problem List   Diagnosis Date Noted   Chronic pain of left knee 12/14/2019   Depression, major, single episode, mild (HCC) 12/14/2019   Left ankle pain 12/14/2019   T2DM (type 2 diabetes mellitus) (HCC) 09/10/2019   Essential hypertension 06/06/2014   Hyperlipidemia 06/06/2014   Gout 06/06/2014   Obesity 06/06/2014    PCP: Donita Brooks, MD  REFERRING PROVIDER: London Sheer, MD  REFERRING DIAG: [phone] (ICD-10-CM) - Radiculopathy, cervical region   THERAPY DIAG:  Cervicalgia  Cramp and spasm  Abnormal posture  Rationale for Evaluation and Treatment: Rehabilitation  ONSET DATE: 3-4 months  SUBJECTIVE:  SUBJECTIVE STATEMENT: Pt reports the exercises have been helping a lot. Turning head with driving is better.  Hand dominance: Right  PERTINENT HISTORY:  DM, high BMI  PAIN:  Are you having pain? no: NPRS scale: 0/10  Pain location: L shoulder blade and upper shoulder  Pain description: ache, sharp, intermittent Aggravating factors: lifting. Lying on L side  Relieving factors: Massage gun, massage  Pain range on eval: 0-10/10   PRECAUTIONS: None  RED FLAGS: None     WEIGHT BEARING RESTRICTIONS: No  FALLS:  Has patient fallen in last 6 months? No  LIVING ENVIRONMENT: Lives with: lives with their family Lives in: House/apartment   OCCUPATION: Not working  PLOF: Independent  PATIENT GOALS: Pain relief  NEXT MD VISIT: to contact Dr. Christell Constant after PT  OBJECTIVE:   DIAGNOSTIC FINDINGS:  Imaging: XR of the cervical spine from 08/12/2022 was previously independently reviewed and interpreted, showing early anterior osteophyte formation seen at C4/5.  Disc height loss with anterior osteophyte formation at C5/6.  No fracture or dislocation seen.  No evidence of instability on flexion/extension views.  PATIENT SURVEYS:  FOTO: Perceived function   46%, predicted   58%   COGNITION: Overall cognitive status: Within functional limits for tasks assessed  SENSATION: WFL  POSTURE: forward head and CT step off  PALPATION: TTP to the L upper trap and levator with increased muscle  tension   CERVICAL ROM:   Active ROM AROM (deg) eval AROM  12/25/22  Flexion 40 no pain   Extension 40 pain L   Right lateral flexion 20 pulling L 30 pulling L  Left lateral flexion 20 pain L 35  Right rotation 50 no pain 58  Left rotation 40 no pain  58   (Blank rows = not tested)  UPPER EXTREMITY ROM:  WNLs and equal Active ROM Right eval Left eval  Shoulder flexion    Shoulder extension    Shoulder abduction    Shoulder adduction    Shoulder extension    Shoulder internal rotation    Shoulder external rotation    Elbow flexion    Elbow extension    Wrist flexion    Wrist extension    Wrist ulnar deviation    Wrist radial deviation    Wrist pronation    Wrist supination     (Blank rows = not tested)  UPPER EXTREMITY MMT: Myotome screen neg, both WNLs and equal MMT Right eval Left eval  Shoulder flexion    Shoulder extension    Shoulder abduction    Shoulder adduction    Shoulder extension    Shoulder internal rotation    Shoulder external rotation    Middle trapezius    Lower trapezius    Elbow flexion    Elbow extension    Wrist flexion    Wrist extension    Wrist ulnar deviation    Wrist radial deviation    Wrist pronation    Wrist supination    Grip strength     (Blank rows = not tested)  CERVICAL SPECIAL TESTS:  Neck flexor muscle endurance test: Negative, Spurling's test: Positive, and Distraction test: Positive  FUNCTIONAL TESTS:  NT  TODAY'S TREATMENT:   [facility] Adult PT Treatment:  DATE: 12/31/22 Therapeutic Exercise: Supine chin tuck x10 3" Supine DNF lift off x10 5" Supine shoulder abd star pattern 2x5 BluTB Supine shoulder ER 2x10 BluTB Cervical upper trap strech x2 15" Cervical rotation stretch x2 15" Seated thoracic ext c foam roller x10 Manual Therapy: STM to the L upper trap Skilled palpation to identify TrPs and taut muscle bands Trigger Point Dry Needling  Treatment: Pre-treatment instruction: Patient instructed on dry needling rationale, procedures, and possible side effects including pain during treatment (achy,cramping feeling), bruising, drop of blood, lightheadedness, nausea, sweating. Patient Consent Given: Yes Education handout provided: Yes Muscles treated: L upper trap  Needle size and number: .30x59mm x 1 Electrical stimulation performed: No Parameters: N/A Treatment response/outcome: Twitch response elicited Post-treatment instructions: Patient instructed to expect possible mild to moderate muscle soreness later today and/or tomorrow. Patient instructed in methods to reduce muscle soreness and to continue prescribed HEP. If patient was dry needled over the lung field, patient was instructed on signs and symptoms of pneumothorax and, however unlikely, to see immediate medical attention should they occur. Patient was also educated on signs and symptoms of infection and to seek medical attention should they occur. Patient verbalized understanding of these instructions and education.   [facility] Adult PT Treatment:                                                DATE: 12/26/22 Therapeutic Exercise: UBE level 3 2.5 min each way  Black band row x 20  Open books at wall x 5 each way Standing chin tuck into towel x 10 Standing green band pullovers with isometric hold x 10  Standing bilateral ER to fatigue Seated thoracic ext foam roller in chair , hands behind head Supine chin tuck over towel roll 5 sec x 10  Manual Therapy: Bil upper trap TPR x 3 Passive upper trap stretch bilat Cervical distraction  Modalities: HMP x 15 min cervical     [facility] Adult PT Treatment:                                                DATE: 12/25/22 Therapeutic Exercise: UBE level 3 2.5 min each way  Black band row x 20 Doorway stretch Open books at wall x 5 each  Chin tuck on wall  Standing horizontal, diagonals and ER to fatigue , blue band  Seated thoracic  ext foam roller in chair , hands behind head Manual Therapy: Seated and STW to upper traps , cervical paraspinals , TPR x 1 left upper trap  PATIENT EDUCATION:  Education details: Eval findings, POC, HEP, self care  Person educated: Patient Education method: Explanation, Demonstration, Tactile cues, Verbal cues, and Handouts Education comprehension: verbalized understanding, returned demonstration, verbal cues required, tactile cues required, and needs further education  HOME EXERCISE PROGRAM: Access Code: [email or web address] URL: [email or web address] Date: 12/11/2022 Prepared by: Joellyn Rued  Exercises - Supine Cervical Retraction with Towel  - 2 x daily - 7 x weekly - 1 sets - 10 reps - 3 hold - Supine DNF Liftoffs  - 2 x daily - 7 x weekly - 1 sets - 10 reps - 5 hold - Seated Passive Cervical Retraction  - 6 x daily - 7 x weekly - 1 sets - 3-5 reps - 3 hold - Standing Shoulder Horizontal Abduction with Resistance  - 2 x daily - 7 x weekly - 1 sets - 5 reps - 3 hold - Shoulder External Rotation and Scapular Retraction with Resistance  - 2 x daily - 7 x weekly - 1 sets - 15 reps - 3 hold - Seated Upper Trapezius Stretch  - 2 x daily - 7 x weekly - 1 sets - 3 reps - 15 hold - Standing Cervical Rotation AROM with Overpressure  - 2 x daily - 7 x weekly - 1 sets - 3 reps - 15 hold  ASSESSMENT:  CLINICAL IMPRESSION: PT was completed for STM f/b TPDN for the L upper trap. A muscle twitch response was elicited. Pt then completed therex for cervical ROM and postural/posterior chain strengthening for muscle activation. After the session, pt reported no increase in L shoulder soreness. Will assess pt's complete response to the TPDN the next PT session. Overall, pt is making good progress re: pain. Reassess FOTO the next PT session.  OBJECTIVE IMPAIRMENTS: decreased ROM,  increased muscle spasms, impaired UE functional use, postural dysfunction, obesity, and pain.   ACTIVITY LIMITATIONS: carrying, lifting, and reach over head  PARTICIPATION LIMITATIONS: cleaning and yard work  PERSONAL FACTORS: Past/current experiences, Time since onset of injury/illness/exacerbation, and 1-2 comorbidities: DM, high BMI  are also affecting patient's functional outcome.   REHAB POTENTIAL: Good  CLINICAL DECISION MAKING: Evolving/moderate complexity  EVALUATION COMPLEXITY: Moderate   GOALS:  SHORT TERM GOALS: Target date: 12/27/22  Pt will be Ind in an initial HEP Baseline: started Goal status: MET   2.  Pt will voice understanding of measures to assist in pain reduction Baseline: started 12/25/22: uses tennis ball, heat, stretches, posture  Goal status: MET  LONG TERM GOALS: Target date: 01/24/23  Pt will be Ind in a final HEP to maintain achieved LOF  Baseline: started Goal status: INITIAL  2.  Increased cervical side bending and rotation by 10d for improved neck function Baseline: see flow sheet 12/25/22: see chart met 3 of 4 motions Goal status: NEARLY MET   3.  Pt will demonstrate proper sitting posture Baseline:  Goal status: INITIAL  4.  Pt will reported 75% or greater improvement in pain with daily activities and sleep Baseline: 0-10/10 12/31/22: 0-5/10, pt est. 40% improvement Goal status: Improved  5.  Pt's FOTO score will improved to the predicted value of 58% as indication of improved function  Baseline: 46% Goal status: INITIAL   PLAN:  PT FREQUENCY: 2x/week  PT DURATION: 6 weeks  PLANNED INTERVENTIONS: Therapeutic exercises, Therapeutic activity, Patient/Family education, Self Care, Joint mobilization, Dry Needling, Electrical stimulation, Spinal mobilization, Cryotherapy, Moist heat, Taping, Traction, Ultrasound, Ionotophoresis 4mg /ml Dexamethasone, Manual therapy, and Re-evaluation  PLAN FOR NEXT SESSION:  TPDN next visit with  Freida Busman. Review FOTO; assess response to HEP; progress therex as indicated; use of modalities, manual therapy; and TPDN as indicated.  Allean Montfort MS, PT 12/31/22 3:16 PM

## 2022-12-31 ENCOUNTER — Ambulatory Visit: Payer: Medicaid Other

## 2022-12-31 DIAGNOSIS — R293 Abnormal posture: Secondary | ICD-10-CM

## 2022-12-31 DIAGNOSIS — M542 Cervicalgia: Secondary | ICD-10-CM | POA: Diagnosis not present

## 2022-12-31 DIAGNOSIS — R252 Cramp and spasm: Secondary | ICD-10-CM

## 2022-12-31 NOTE — Patient Instructions (Signed)

## 2023-01-01 ENCOUNTER — Telehealth: Payer: Self-pay | Admitting: Radiology

## 2023-01-01 ENCOUNTER — Encounter: Payer: Self-pay | Admitting: Radiology

## 2023-01-01 NOTE — Telephone Encounter (Signed)
error 

## 2023-01-02 ENCOUNTER — Encounter: Payer: Self-pay | Admitting: Physical Therapy

## 2023-01-02 ENCOUNTER — Ambulatory Visit: Payer: Medicaid Other | Admitting: Physical Therapy

## 2023-01-02 DIAGNOSIS — R293 Abnormal posture: Secondary | ICD-10-CM

## 2023-01-02 DIAGNOSIS — R252 Cramp and spasm: Secondary | ICD-10-CM

## 2023-01-02 DIAGNOSIS — M542 Cervicalgia: Secondary | ICD-10-CM | POA: Diagnosis not present

## 2023-01-02 NOTE — Therapy (Signed)
OUTPATIENT PHYSICAL THERAPY CERVICAL TREATMENT   Patient Name: Xavier Daniels MRN: [id number] DOB:16-Jun-1965, 57 y.o., male Today's Date: 01/02/2023  END OF SESSION:  PT End of Session - 01/02/23 0849     Visit Number 7    Number of Visits 13    Date for PT Re-Evaluation 01/24/23    Authorization Type [place] MEDICAID HEALTHY BLUE    Authorization Time Period 8/26 to 10/24    Authorization - Visit Number 6    Authorization - Number of Visits 7    PT Start Time 0845    PT Stop Time 0930    PT Time Calculation (min) 45 min                Past Medical History:  Diagnosis Date   Arthritis    Colon cancer screening 10/20/2018   Diabetes mellitus without complication (HCC)    Gout    Gout    Hypertension    Substance abuse (HCC)    ETOH, 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, CLEAN FOR 13 YEARS   Past Surgical History:  Procedure Laterality Date   BIOPSY  04/20/2019   Procedure: BIOPSY;  Surgeon: West Bali, MD;  [facility];  Service: Endoscopy;;  transverse polyp x1   COLONOSCOPY WITH PROPOFOL N/A 04/20/2019   Procedure: COLONOSCOPY WITH PROPOFOL;  Surgeon: West Bali, MD;  [facility];  Service: Endoscopy;  Laterality: N/A;  9:30am-office rescheduled 04/20/19 @ 8:30am   POLYPECTOMY  04/20/2019   Procedure: POLYPECTOMY;  Surgeon: West Bali, MD;  [facility];  Service: Endoscopy;;  transversex1, rectal polyp   TOTAL HIP ARTHROPLASTY  2007   due to injury for avascular necrosis.   Patient Active Problem List   Diagnosis Date Noted   Chronic pain of left knee 12/14/2019   Depression, major, single episode, mild (HCC) 12/14/2019   Left ankle pain 12/14/2019   T2DM (type 2 diabetes mellitus) (HCC) 09/10/2019   Essential hypertension 06/06/2014   Hyperlipidemia 06/06/2014   Gout 06/06/2014   Obesity 06/06/2014    PCP: Donita Brooks, MD  REFERRING PROVIDER: London Sheer, MD  REFERRING DIAG: [phone] (ICD-10-CM) - Radiculopathy,  cervical region   THERAPY DIAG:  Cervicalgia  Cramp and spasm  Abnormal posture  Rationale for Evaluation and Treatment: Rehabilitation  ONSET DATE: 3-4 months  SUBJECTIVE:  SUBJECTIVE STATEMENT: PT has helped, I have one more session. I learned some things that help. Dry needling was okay, I did not get sore. I think it helped a little.   Hand dominance: Right  PERTINENT HISTORY:  DM, high BMI  PAIN:  Are you having pain? no: NPRS scale: 3/10  Pain location: L shoulder blade and upper shoulder  Pain description: ache, sharp, intermittent Aggravating factors: lifting. Lying on L side  Relieving factors: Massage gun, massage  Pain range on eval: 0-10/10   PRECAUTIONS: None  RED FLAGS: None     WEIGHT BEARING RESTRICTIONS: No  FALLS:  Has patient fallen in last 6 months? No  LIVING ENVIRONMENT: Lives with: lives with their family Lives in: House/apartment   OCCUPATION: Not working  PLOF: Independent  PATIENT GOALS: Pain relief  NEXT MD VISIT: to contact Dr. Christell Constant after PT  OBJECTIVE:   DIAGNOSTIC FINDINGS:  Imaging: XR of the cervical spine from 08/12/2022 was previously independently reviewed and interpreted, showing early anterior osteophyte formation seen at C4/5.  Disc height loss with anterior osteophyte formation at C5/6.  No fracture or dislocation seen.  No evidence of instability on flexion/extension views.  PATIENT SURVEYS:  FOTO: Perceived function   46%, predicted   58%   COGNITION: Overall cognitive status: Within functional limits for tasks assessed  SENSATION: WFL  POSTURE: forward head and CT step off  PALPATION: TTP to the L upper trap and levator with increased muscle tension   CERVICAL ROM:   Active ROM AROM (deg) eval AROM   12/25/22 AROM 01/02/23  Flexion 40 no pain    Extension 40 pain L    Right lateral flexion 20 pulling L 30 pulling L 45  Left lateral flexion 20 pain L 35 45  Right rotation 50 no pain 58 60  Left rotation 40 no pain  58 60   (Blank rows = not tested)  UPPER EXTREMITY ROM:  WNLs and equal Active ROM Right eval Left eval  Shoulder flexion    Shoulder extension    Shoulder abduction    Shoulder adduction    Shoulder extension    Shoulder internal rotation    Shoulder external rotation    Elbow flexion    Elbow extension    Wrist flexion    Wrist extension    Wrist ulnar deviation    Wrist radial deviation    Wrist pronation    Wrist supination     (Blank rows = not tested)  UPPER EXTREMITY MMT: Myotome screen neg, both WNLs and equal MMT Right eval Left eval  Shoulder flexion    Shoulder extension    Shoulder abduction    Shoulder adduction    Shoulder extension    Shoulder internal rotation    Shoulder external rotation    Middle trapezius    Lower trapezius    Elbow flexion    Elbow extension    Wrist flexion    Wrist extension    Wrist ulnar deviation    Wrist radial deviation    Wrist pronation    Wrist supination    Grip strength     (Blank rows = not tested)  CERVICAL SPECIAL TESTS:  Neck flexor muscle endurance test: Negative, Spurling's test: Positive, and Distraction test: Positive  FUNCTIONAL TESTS:  NT  TODAY'S TREATMENT:   [facility] Adult PT Treatment:  DATE: 01/02/23 Therapeutic Exercise: UBE Black Band Row  Open books Doorway stretch Star pattern  Supine DNF lift off x 5 10" FOTO  Manual therapy Passive upper trap and levator stretches      [facility] Adult PT Treatment:                                                DATE: 12/31/22 Therapeutic Exercise: Supine chin tuck x10 3" Supine DNF lift off x10 5" Supine shoulder abd star pattern 2x5 BluTB Supine shoulder ER 2x10 BluTB Cervical  upper trap strech x2 15" Cervical rotation stretch x2 15" Seated thoracic ext c foam roller x10 Manual Therapy: STM to the L upper trap Skilled palpation to identify TrPs and taut muscle bands Trigger Point Dry Needling Treatment: Pre-treatment instruction: Patient instructed on dry needling rationale, procedures, and possible side effects including pain during treatment (achy,cramping feeling), bruising, drop of blood, lightheadedness, nausea, sweating. Patient Consent Given: Yes Education handout provided: Yes Muscles treated: L upper trap  Needle size and number: .30x71mm x 1 Electrical stimulation performed: No Parameters: N/A Treatment response/outcome: Twitch response elicited Post-treatment instructions: Patient instructed to expect possible mild to moderate muscle soreness later today and/or tomorrow. Patient instructed in methods to reduce muscle soreness and to continue prescribed HEP. If patient was dry needled over the lung field, patient was instructed on signs and symptoms of pneumothorax and, however unlikely, to see immediate medical attention should they occur. Patient was also educated on signs and symptoms of infection and to seek medical attention should they occur. Patient verbalized understanding of these instructions and education.   [facility] Adult PT Treatment:                                                DATE: 12/26/22 Therapeutic Exercise: UBE level 3 2.5 min each way  Black band row x 20  Open books at wall x 5 each way Standing chin tuck into towel x 10 Standing green band pullovers with isometric hold x 10  Standing bilateral ER to fatigue Seated thoracic ext foam roller in chair , hands behind head Supine chin tuck over towel roll 5 sec x 10  Manual Therapy: Bil upper trap TPR x 3 Passive upper trap stretch bilat Cervical distraction  Modalities: HMP x 15 min cervical     [facility] Adult PT Treatment:                                                DATE:  12/25/22 Therapeutic Exercise: UBE level 3 2.5 min each way  Black band row x 20 Doorway stretch Open books at wall x 5 each  Chin tuck on wall  Standing horizontal, diagonals and ER to fatigue , blue band  Seated thoracic ext foam roller in chair , hands behind head Manual Therapy: Seated and STW to upper traps , cervical paraspinals , TPR x 1 left upper trap  PATIENT EDUCATION:  Education details: Eval findings, POC, HEP, self care  Person educated: Patient Education method: Explanation, Demonstration, Tactile cues, Verbal cues, and Handouts Education comprehension: verbalized understanding, returned demonstration, verbal cues required, tactile cues required, and needs further education  HOME EXERCISE PROGRAM: Access Code: [email or web address] URL: [email or web address] Date: 12/11/2022 Prepared by: Joellyn Rued  Exercises - Supine Cervical Retraction with Towel  - 2 x daily - 7 x weekly - 1 sets - 10 reps - 3 hold - Supine DNF Liftoffs  - 2 x daily - 7 x weekly - 1 sets - 10 reps - 5 hold - Seated Passive Cervical Retraction  - 6 x daily - 7 x weekly - 1 sets - 3-5 reps - 3 hold - Standing Shoulder Horizontal Abduction with Resistance  - 2 x daily - 7 x weekly - 1 sets - 5 reps - 3 hold - Shoulder External Rotation and Scapular Retraction with Resistance  - 2 x daily - 7 x weekly - 1 sets - 15 reps - 3 hold - Seated Upper Trapezius Stretch  - 2 x daily - 7 x weekly - 1 sets - 3 reps - 15 hold - Standing Cervical Rotation AROM with Overpressure  - 2 x daily - 7 x weekly - 1 sets - 3 reps - 15 hold  ASSESSMENT:  CLINICAL IMPRESSION: Pt reports overall improvement. He will follow up with MD soon. FOTO score improved to target 58%, LTG# 5 met. AROM cervical improved, LTG# 2 Met. He is on track to finish at next visit.     OBJECTIVE IMPAIRMENTS: decreased  ROM, increased muscle spasms, impaired UE functional use, postural dysfunction, obesity, and pain.   ACTIVITY LIMITATIONS: carrying, lifting, and reach over head  PARTICIPATION LIMITATIONS: cleaning and yard work  PERSONAL FACTORS: Past/current experiences, Time since onset of injury/illness/exacerbation, and 1-2 comorbidities: DM, high BMI  are also affecting patient's functional outcome.   REHAB POTENTIAL: Good  CLINICAL DECISION MAKING: Evolving/moderate complexity  EVALUATION COMPLEXITY: Moderate   GOALS:  SHORT TERM GOALS: Target date: 12/27/22  Pt will be Ind in an initial HEP Baseline: started Goal status: MET   2.  Pt will voice understanding of measures to assist in pain reduction Baseline: started 12/25/22: uses tennis ball, heat, stretches, posture  Goal status: MET  LONG TERM GOALS: Target date: 01/24/23  Pt will be Ind in a final HEP to maintain achieved LOF  Baseline: started Goal status: INITIAL  2.  Increased cervical side bending and rotation by 10d for improved neck function Baseline: see flow sheet 12/25/22: see chart met 3 of 4 motions 01/02/23: met in all planes  Goal status: MET   3.  Pt will demonstrate proper sitting posture Baseline:  01/02/23: trying to be mindful  Goal status: ONGOING  4.  Pt will reported 75% or greater improvement in pain with daily activities and sleep Baseline: 0-10/10 12/31/22: 0-5/10, pt est. 40% improvement Goal status: Improved  5.  Pt's FOTO score will improved to the predicted value of 58% as indication of improved function  Baseline: 46% 01/02/23: 58% Goal status: MET   PLAN:  PT FREQUENCY: 2x/week  PT DURATION: 6 weeks  PLANNED INTERVENTIONS: Therapeutic exercises, Therapeutic activity, Patient/Family education, Self Care, Joint mobilization, Dry Needling, Electrical stimulation, Spinal mobilization, Cryotherapy, Moist heat, Taping, Traction, Ultrasound, Ionotophoresis 4mg /ml Dexamethasone, Manual therapy,  and Re-evaluation  PLAN FOR NEXT SESSION: Hold vs DC  Jannette Spanner, PTA 01/02/23 9:33 AM Phone: [phone] Fax: [phone]

## 2023-01-06 NOTE — Therapy (Signed)
OUTPATIENT PHYSICAL THERAPY CERVICAL TREATMENT/Discharge   Patient Name: Xavier Daniels MRN: [id number] DOB:[birth year], 57 y.o., male Today's Date: 01/07/2023  END OF SESSION:  PT End of Session - 01/07/23 0855     Visit Number 8    Number of Visits 13    Date for PT Re-Evaluation 01/24/23    Authorization Type [place] MEDICAID HEALTHY BLUE    Authorization - Visit Number 7    Authorization - Number of Visits 7    PT Start Time [phone]    PT Stop Time 0929    PT Time Calculation (min) 40 min    Activity Tolerance Patient tolerated treatment well    Behavior During Therapy [facility] for tasks assessed/performed                 Past Medical History:  Diagnosis Date   Arthritis    Colon cancer screening 10/20/2018   Diabetes mellitus without complication (HCC)    Gout    Gout    Hypertension    Substance abuse (HCC)    ETOH, 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, CLEAN FOR 13 YEARS   Past Surgical History:  Procedure Laterality Date   BIOPSY  04/20/2019   Procedure: BIOPSY;  Surgeon: West Bali, MD;  [facility];  Service: Endoscopy;;  transverse polyp x1   COLONOSCOPY WITH PROPOFOL N/A 04/20/2019   Procedure: COLONOSCOPY WITH PROPOFOL;  Surgeon: West Bali, MD;  [facility]: [facility];  Service: Endoscopy;  Laterality: N/A;  9:30am-office rescheduled 04/20/19 @ 8:30am   POLYPECTOMY  04/20/2019   Procedure: POLYPECTOMY;  Surgeon: West Bali, MD;  [facility]: [facility];  Service: Endoscopy;;  transversex1, rectal polyp   TOTAL HIP ARTHROPLASTY  2007   due to injury for avascular necrosis.   Patient Active Problem List   Diagnosis Date Noted   Chronic pain of left knee 12/14/2019   Depression, major, single episode, mild (HCC) 12/14/2019   Left ankle pain 12/14/2019   T2DM (type 2 diabetes mellitus) (HCC) 09/10/2019   Essential hypertension 06/06/2014   Hyperlipidemia 06/06/2014   Gout 06/06/2014   Obesity 06/06/2014    PCP: Donita Brooks, MD  REFERRING  PROVIDER: London Sheer, MD  REFERRING DIAG: [phone] (ICD-10-CM) - Radiculopathy, cervical region   THERAPY DIAG:  Cervicalgia  Cramp and spasm  Abnormal posture  Rationale for Evaluation and Treatment: Rehabilitation  ONSET DATE: 3-4 months  SUBJECTIVE:  SUBJECTIVE STATEMENT: I'm doing better. Intermittent pain: 0-3/10  Hand dominance: Right  PERTINENT HISTORY:  DM, high BMI  PAIN:  Are you having pain? no: NPRS scale: 0/10. 0-3/10 pain range on DC.  Pain location: L shoulder blade and upper shoulder  Pain description: ache, sharp, intermittent Aggravating factors: lifting. Lying on L side  Relieving factors: Massage gun, massage  Pain range on eval: 0-10/10   PRECAUTIONS: None  RED FLAGS: None     WEIGHT BEARING RESTRICTIONS: No  FALLS:  Has patient fallen in last 6 months? No  LIVING ENVIRONMENT: Lives with: lives with their family Lives in: House/apartment   OCCUPATION: Not working  PLOF: Independent  PATIENT GOALS: Pain relief  NEXT MD VISIT: to contact Dr. Christell Constant after PT  OBJECTIVE:   DIAGNOSTIC FINDINGS:  Imaging: XR of the cervical spine from 08/12/2022 was previously independently reviewed and interpreted, showing early anterior osteophyte formation seen at C4/5.  Disc height loss with anterior osteophyte formation at C5/6.  No fracture or dislocation seen.  No evidence of instability on flexion/extension views.  PATIENT SURVEYS:  FOTO: Perceived function   46%, predicted   58%   COGNITION: Overall cognitive status: Within functional limits for tasks assessed  SENSATION: WFL  POSTURE: forward head and CT step off  PALPATION: TTP to the L upper trap and levator with increased muscle tension   CERVICAL ROM:   Active ROM AROM  (deg) eval AROM  12/25/22 AROM 01/02/23  Flexion 40 no pain    Extension 40 pain L    Right lateral flexion 20 pulling L 30 pulling L 45  Left lateral flexion 20 pain L 35 45  Right rotation 50 no pain 58 60  Left rotation 40 no pain  58 60   (Blank rows = not tested)  UPPER EXTREMITY ROM:  WNLs and equal Active ROM Right eval Left eval  Shoulder flexion    Shoulder extension    Shoulder abduction    Shoulder adduction    Shoulder extension    Shoulder internal rotation    Shoulder external rotation    Elbow flexion    Elbow extension    Wrist flexion    Wrist extension    Wrist ulnar deviation    Wrist radial deviation    Wrist pronation    Wrist supination     (Blank rows = not tested)  UPPER EXTREMITY MMT: Myotome screen neg, both WNLs and equal MMT Right eval Left eval  Shoulder flexion    Shoulder extension    Shoulder abduction    Shoulder adduction    Shoulder extension    Shoulder internal rotation    Shoulder external rotation    Middle trapezius    Lower trapezius    Elbow flexion    Elbow extension    Wrist flexion    Wrist extension    Wrist ulnar deviation    Wrist radial deviation    Wrist pronation    Wrist supination    Grip strength     (Blank rows = not tested)  CERVICAL SPECIAL TESTS:  Neck flexor muscle endurance test: Negative, Spurling's test: Positive, and Distraction test: Positive  FUNCTIONAL TESTS:  NT  TODAY'S TREATMENT: [facility] Adult PT Treatment:  DATE: 01/07/23 Therapeutic Exercise: UBE 5 mins L1, 3 min FWB, 3 mins BWD Black Band Row 2x15 Open books x5 5' Doorway stretch x4 20" Star pattern c BluTB 2x5 Shoulder ER 2x10 BluTB Supine chin tucks x10 3" Supine DNF lift off x10 10" Final HEP    [facility] Adult PT Treatment:                                                DATE: 01/02/23 Therapeutic Exercise: UBE Black Band Row  Open books Doorway stretch Star pattern  Supine  DNF lift off x 5 10" FOTO  Manual therapy Passive upper trap and levator stretches  [facility] Adult PT Treatment:                                                DATE: 12/31/22 Therapeutic Exercise: Supine chin tuck x10 3" Supine DNF lift off x10 5" Supine shoulder abd star pattern 2x5 BluTB Supine shoulder ER 2x10 BluTB Cervical upper trap strech x2 15" Cervical rotation stretch x2 15" Seated thoracic ext c foam roller x10 Manual Therapy: STM to the L upper trap Skilled palpation to identify TrPs and taut muscle bands Trigger Point Dry Needling Treatment: Pre-treatment instruction: Patient instructed on dry needling rationale, procedures, and possible side effects including pain during treatment (achy,cramping feeling), bruising, drop of blood, lightheadedness, nausea, sweating. Patient Consent Given: Yes Education handout provided: Yes Muscles treated: L upper trap  Needle size and number: .30x23mm x 1 Electrical stimulation performed: No Parameters: N/A Treatment response/outcome: Twitch response elicited Post-treatment instructions: Patient instructed to expect possible mild to moderate muscle soreness later today and/or tomorrow. Patient instructed in methods to reduce muscle soreness and to continue prescribed HEP. If patient was dry needled over the lung field, patient was instructed on signs and symptoms of pneumothorax and, however unlikely, to see immediate medical attention should they occur. Patient was also educated on signs and symptoms of infection and to seek medical attention should they occur. Patient verbalized understanding of these instructions and education.                                                                                                                             PATIENT EDUCATION:  Education details: Eval findings, POC, HEP, self care  Person educated: Patient Education method: Explanation, Demonstration, Tactile cues, Verbal cues, and  Handouts Education comprehension: verbalized understanding, returned demonstration, verbal cues required, tactile cues required, and needs further education  HOME EXERCISE PROGRAM: Access Code: [email or web address] URL: [email or web address] Date: 12/11/2022 Prepared by: Joellyn Rued  Exercises - Supine Cervical Retraction with Towel  - 2 x daily - 7 x weekly - 1 sets - 10  reps - 3 hold - Supine DNF Liftoffs  - 2 x daily - 7 x weekly - 1 sets - 10 reps - 5 hold - Seated Passive Cervical Retraction  - 6 x daily - 7 x weekly - 1 sets - 3-5 reps - 3 hold - Standing Shoulder Horizontal Abduction with Resistance  - 2 x daily - 7 x weekly - 1 sets - 5 reps - 3 hold - Shoulder External Rotation and Scapular Retraction with Resistance  - 2 x daily - 7 x weekly - 1 sets - 15 reps - 3 hold - Seated Upper Trapezius Stretch  - 2 x daily - 7 x weekly - 1 sets - 3 reps - 15 hold - Standing Cervical Rotation AROM with Overpressure  - 2 x daily - 7 x weekly - 1 sets - 3 reps - 15 hold  ASSESSMENT:  CLINICAL IMPRESSION: Pt's neck is continuing to be much improved. He has met or partially met all goals. Pt is Ind in a HEP to maintain or improve his achieved level of progress. Pt is pleased with his progress and agrees to DC from PT as this time.  OBJECTIVE IMPAIRMENTS: decreased ROM, increased muscle spasms, impaired UE functional use, postural dysfunction, obesity, and pain.   ACTIVITY LIMITATIONS: carrying, lifting, and reach over head  PARTICIPATION LIMITATIONS: cleaning and yard work  PERSONAL FACTORS: Past/current experiences, Time since onset of injury/illness/exacerbation, and 1-2 comorbidities: DM, high BMI  are also affecting patient's functional outcome.   REHAB POTENTIAL: Good  CLINICAL DECISION MAKING: Evolving/moderate complexity  EVALUATION COMPLEXITY: Moderate   GOALS:  SHORT TERM GOALS: Target date: 12/27/22  Pt will be Ind in an initial HEP Baseline: started Goal status:  MET   2.  Pt will voice understanding of measures to assist in pain reduction Baseline: started 12/25/22: uses tennis ball, heat, stretches, posture  Goal status: MET  LONG TERM GOALS: Target date: 01/24/23  Pt will be Ind in a final HEP to maintain achieved LOF  Baseline: started Goal status: MET  2.  Increased cervical side bending and rotation by 10d for improved neck function Baseline: see flow sheet 12/25/22: see chart met 3 of 4 motions 01/02/23: met in all planes  Goal status: MET   3.  Pt will demonstrate proper sitting posture Baseline:  01/02/23: trying to be mindful  01/07/23: pt is able to demonstrate proper sitting posture Goal status: MET  4.  Pt will reported 75% or greater improvement in pain with daily activities and sleep Baseline: 0-10/10 12/31/22: 0-5/10, pt est. 40% improvement 01/07/23: 0-3/10, pt est. 40% improvement Goal status: Improved  5.  Pt's FOTO score will improved to the predicted value of 58% as indication of improved function  Baseline: 46% 01/02/23: 58% Goal status: MET   PLAN:  PT FREQUENCY: 2x/week  PT DURATION: 6 weeks  PLANNED INTERVENTIONS: Therapeutic exercises, Therapeutic activity, Patient/Family education, Self Care, Joint mobilization, Dry Needling, Electrical stimulation, Spinal mobilization, Cryotherapy, Moist heat, Taping, Traction, Ultrasound, Ionotophoresis 4mg /ml Dexamethasone, Manual therapy, and Re-evaluation  PLAN FOR NEXT SESSION:   PHYSICAL THERAPY DISCHARGE SUMMARY  Visits from Start of Care: 8  Current functional level related to goals / functional outcomes: See clinical impression and PT goals    Remaining deficits: See clinical impression and PT goals   Education / Equipment: HEP. Pt Ed   Patient agrees to discharge. Patient goals were met. Patient is being discharged due to being pleased with the current functional level.  Cassius Cullinane MS, PT 01/07/23 9:27 AM

## 2023-01-07 ENCOUNTER — Ambulatory Visit: Payer: Medicaid Other

## 2023-01-07 DIAGNOSIS — R252 Cramp and spasm: Secondary | ICD-10-CM

## 2023-01-07 DIAGNOSIS — M542 Cervicalgia: Secondary | ICD-10-CM

## 2023-01-07 DIAGNOSIS — R293 Abnormal posture: Secondary | ICD-10-CM

## 2023-01-09 ENCOUNTER — Ambulatory Visit: Payer: Medicaid Other | Admitting: Physical Therapy

## 2023-01-15 ENCOUNTER — Other Ambulatory Visit: Payer: Self-pay | Admitting: Family Medicine

## 2023-01-16 ENCOUNTER — Encounter: Payer: Self-pay | Admitting: Family Medicine

## 2023-01-16 ENCOUNTER — Ambulatory Visit: Payer: Medicaid Other | Admitting: Family Medicine

## 2023-01-16 VITALS — BP 124/76 | HR 93 | Temp 97.9°F | Ht 73.0 in | Wt 254.0 lb

## 2023-01-16 DIAGNOSIS — Z7984 Long term (current) use of oral hypoglycemic drugs: Secondary | ICD-10-CM | POA: Diagnosis not present

## 2023-01-16 DIAGNOSIS — E1169 Type 2 diabetes mellitus with other specified complication: Secondary | ICD-10-CM | POA: Diagnosis not present

## 2023-01-16 DIAGNOSIS — Z23 Encounter for immunization: Secondary | ICD-10-CM

## 2023-01-16 NOTE — Addendum Note (Signed)
Addended by: Venia Carbon K on: 01/16/2023 12:18 PM   Modules accepted: Orders

## 2023-01-16 NOTE — Progress Notes (Signed)
Subjective:    Patient ID: Xavier Daniels, male    DOB: 09/08/1965, 57 y.o.   MRN: [id number]  HPI Patient has previously been seeing physical therapy for cervical radiculopathy and pain radiating down his left arm.  He states that physical therapy has helped substantially with this.  He denies any current pain in his neck or pain radiating down his arm.  He requested a pain prescription refill.  I did refill the prescription for 30 tablets but I will ask him to start using this sparingly only for flareups.  I would like that prescription to start the last 3 to 4 months.  He denies any chest pain or shortness of breath.  His last hemoglobin A1c was 6.9 in January.  He is currently taking Jardiance.  He denies any yeast infections.  He denies any dysuria.  Has been more than a year since his last eye exam.  Diabetic foot exam was performed today and was normal. Past Medical History:  Diagnosis Date   Arthritis    Colon cancer screening 10/20/2018   Diabetes mellitus without complication (HCC)    Gout    Gout    Hypertension    Substance abuse (HCC)    ETOH, 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, CLEAN FOR 13 YEARS   Past Surgical History:  Procedure Laterality Date   BIOPSY  04/20/2019   Procedure: BIOPSY;  Surgeon: West Bali, MD;  [facility];  Service: Endoscopy;;  transverse polyp x1   COLONOSCOPY WITH PROPOFOL N/A 04/20/2019   Procedure: COLONOSCOPY WITH PROPOFOL;  Surgeon: West Bali, MD;  [facility];  Service: Endoscopy;  Laterality: N/A;  9:30am-office rescheduled 04/20/19 @ 8:30am   POLYPECTOMY  04/20/2019   Procedure: POLYPECTOMY;  Surgeon: West Bali, MD;  [facility];  Service: Endoscopy;;  transversex1, rectal polyp   TOTAL HIP ARTHROPLASTY  2007   due to injury for avascular necrosis.   Current Outpatient Medications on File Prior to Visit  Medication Sig Dispense Refill   allopurinol (ZYLOPRIM) 300 MG tablet TAKE ONE (1) TABLET BY MOUTH EVERY DAY  90 tablet 0   amLODipine (NORVASC) 10 MG tablet Take 1 tablet (10 mg total) by mouth daily. This replaces atenolol 90 tablet 3   colchicine 0.6 MG tablet Take 1 tablet (0.6 mg total) by mouth 2 (two) times daily as needed (gout). 30 tablet 1   diclofenac Sodium (VOLTAREN) 1 % GEL APPLY 2 GRAMS FOUR TIMES A DAY 100 g 0   empagliflozin (JARDIANCE) 25 MG TABS tablet TAKE ONE (1) TABLET BY MOUTH EVERY DAY BEFORE BREAKFAST 90 tablet 3   HYDROcodone-acetaminophen (NORCO) 7.5-325 MG tablet Take 1 tablet by mouth every 6 (six) hours as needed for moderate pain. 30 tablet 0   ibuprofen (ADVIL) 800 MG tablet TAKE ONE TABLET BY MOUTH EVERY EIGHT HOURS AS NEEDED 60 tablet 1   levocetirizine (XYZAL ALLERGY 24HR) 5 MG tablet Take 1 tablet (5 mg total) by mouth every evening. 30 tablet 5   lidocaine (LIDODERM) 5 % Place 1 patch onto the skin daily. Remove & Discard patch within 12 hours or as directed by MD 30 patch 0   losartan (COZAAR) 100 MG tablet Take 1 tablet (100 mg total) by mouth daily. 90 tablet 3   methocarbamol (ROBAXIN-750) 750 MG tablet Take 1 tablet (750 mg total) by mouth every 8 (eight) hours as needed for muscle spasms (discontinue tizanidine). 60 tablet 0   methylPREDNISolone (MEDROL DOSEPAK)  4 MG TBPK tablet As directed 21 tablet 0   pravastatin (PRAVACHOL) 40 MG tablet Take 1 tablet (40 mg total) by mouth daily. 90 tablet 3   predniSONE (DELTASONE) 20 MG tablet 3 tabs poqday 1-2, 2 tabs poqday 3-4, 1 tab poqday 5-6 12 tablet 0   pregabalin (LYRICA) 100 MG capsule TAKE ONE CAPSULE (100MG  TOTAL) BY MOUTH THREE TIMES DAILY 90 capsule 3   sildenafil (VIAGRA) 100 MG tablet TAKE ONE-HALF TO ONE TABLET (50 TO 100MG TOTAL) BY MOUTH DAILY AS NEEDED FOR ERECTILE DYSFUNCTION 5 tablet 3   No current facility-administered medications on file prior to visit.   No Known Allergies Social History   Socioeconomic History   Marital status: Married    Spouse name: Not on file   Number of children: Not on  file   Years of education: Not on file   Highest education level: Not on file  Occupational History   Not on file  Tobacco Use   Smoking status: Never   Smokeless tobacco: Never  Vaping Use   Vaping status: Never Used  Substance and Sexual Activity   Alcohol use: No    Comment: previous heavy use, quit 2004   Drug use: No    Comment: previous marijuana use   Sexual activity: Yes    Birth control/protection: None  Other Topics Concern   Not on file  Social History Narrative   Not on file   Social Determinants of Health   Financial Resource Strain: Not on file  Food Insecurity: Not on file  Transportation Needs: Not on file  Physical Activity: Not on file  Stress: Not on file  Social Connections: Not on file  Intimate Partner Violence: Not on file      Review of Systems  All other systems reviewed and are negative.      Objective:   Physical Exam Vitals reviewed.  Constitutional:      General: He is not in acute distress.    Appearance: Normal appearance. He is not ill-appearing or toxic-appearing.  HENT:     Mouth/Throat:     Pharynx: No oropharyngeal exudate or posterior oropharyngeal erythema.  Cardiovascular:     Rate and Rhythm: Normal rate and regular rhythm.     Pulses: Normal pulses.     Heart sounds: Normal heart sounds. No murmur heard.    No friction rub. No gallop.  Pulmonary:     Effort: Pulmonary effort is normal. No respiratory distress.     Breath sounds: Normal breath sounds. No stridor. No wheezing, rhonchi or rales.  Musculoskeletal:     Right lower leg: No edema.     Left lower leg: No edema.  Lymphadenopathy:     Cervical: No cervical adenopathy.  Neurological:     Mental Status: He is alert.           Assessment & Plan:  Type 2 diabetes mellitus with other specified complication, without long-term current use of insulin (HCC) - Plan: CBC with Differential/Platelet, COMPLETE METABOLIC PANEL WITH GFR, Hemoglobin A1c, Protein /  Creatinine Ratio, Urine, Lipid panel We will start to wean the patient away from hydrocodone.  Patient received flu shot today.  Blood pressure is outstanding.  Check globin A1c.  Goal globin A1c is less than 6.5.  Check LDL cholesterol.  Goal LDL cholesterol is less than 100.  Check urine protein to creatinine ratio.  Diabetic foot exam was performed and was normal.  Recommended annual diabetic eye exam.

## 2023-01-17 LAB — HEMOGLOBIN A1C
Hgb A1c MFr Bld: 7.7 %{Hb} — ABNORMAL HIGH (ref <5.7)
Mean Plasma Glucose: 174 mg/dL
eAG (mmol/L): 9.7 mmol/L

## 2023-01-17 LAB — CBC WITH DIFFERENTIAL/PLATELET
Absolute Monocytes: 392 {cells}/uL (ref 200–950)
Basophils Absolute: 41 {cells}/uL (ref 0–200)
Basophils Relative: 0.9 %
Eosinophils Absolute: 131 {cells}/uL (ref 15–500)
Eosinophils Relative: 2.9 %
HCT: 45.3 % (ref 38.5–50.0)
Hemoglobin: 14.6 g/dL (ref 13.2–17.1)
Lymphs Abs: 1967 {cells}/uL (ref 850–3900)
MCH: 28.9 pg (ref 27.0–33.0)
MCHC: 32.2 g/dL (ref 32.0–36.0)
MCV: 89.5 fL (ref 80.0–100.0)
MPV: 11 fL (ref 7.5–12.5)
Monocytes Relative: 8.7 %
Neutro Abs: 1971 {cells}/uL (ref 1500–7800)
Neutrophils Relative %: 43.8 %
Platelets: 237 10*3/uL (ref 140–400)
RBC: 5.06 10*6/uL (ref 4.20–5.80)
RDW: 13.8 % (ref 11.0–15.0)
Total Lymphocyte: 43.7 %
WBC: 4.5 10*3/uL (ref 3.8–10.8)

## 2023-01-17 LAB — COMPLETE METABOLIC PANEL WITH GFR
AG Ratio: 1.5 (calc) (ref 1.0–2.5)
ALT: 24 U/L (ref 9–46)
AST: 21 U/L (ref 10–35)
Albumin: 4.4 g/dL (ref 3.6–5.1)
Alkaline phosphatase (APISO): 91 U/L (ref 35–144)
BUN: 11 mg/dL (ref 7–25)
CO2: 22 mmol/L (ref 20–32)
Calcium: 9.5 mg/dL (ref 8.6–10.3)
Chloride: 106 mmol/L (ref 98–110)
Creat: 1.08 mg/dL (ref 0.70–1.30)
Globulin: 3 g/dL (ref 1.9–3.7)
Glucose, Bld: 144 mg/dL — ABNORMAL HIGH (ref 65–99)
Potassium: 4 mmol/L (ref 3.5–5.3)
Sodium: 138 mmol/L (ref 135–146)
Total Bilirubin: 0.6 mg/dL (ref 0.2–1.2)
Total Protein: 7.4 g/dL (ref 6.1–8.1)
eGFR: 80 mL/min/{1.73_m2} (ref >=60)

## 2023-01-17 LAB — LIPID PANEL
Cholesterol: 166 mg/dL (ref <200)
HDL: 43 mg/dL (ref >=40)
LDL Cholesterol (Calc): 96 mg/dL
Non-HDL Cholesterol (Calc): 123 mg/dL (ref <130)
Total CHOL/HDL Ratio: 3.9 (calc) (ref <5.0)
Triglycerides: 178 mg/dL — ABNORMAL HIGH (ref <150)

## 2023-01-17 LAB — PROTEIN / CREATININE RATIO, URINE
Creatinine, Urine: 81 mg/dL (ref 20–320)
Protein/Creat Ratio: 99 mg/g{creat} (ref 25–148)
Protein/Creatinine Ratio: 0.099 mg/mg{creat} (ref 0.025–0.148)
Total Protein, Urine: 8 mg/dL (ref 5–25)

## 2023-01-20 ENCOUNTER — Other Ambulatory Visit: Payer: Self-pay

## 2023-01-20 ENCOUNTER — Ambulatory Visit: Payer: Medicaid Other | Admitting: Orthopedic Surgery

## 2023-01-20 DIAGNOSIS — M5412 Radiculopathy, cervical region: Secondary | ICD-10-CM

## 2023-01-20 NOTE — Progress Notes (Signed)
Orthopedic Spine Surgery Office Note   Assessment: Patient is a 57 y.o. male with neck pain that radiates into the bilateral shoulders, suspect radiculopathy     Plan: -Patient has tried Tylenol, Toradol, Robaxin, Lyrica, norco, prednisone, PT -Pain has gotten significantly better with PT. He only did 7 sessions so I told him if the pain comes back, would refer him to PT again  -Continue with home exercise program -Told him he can call the office and I can put in a new referral for PT -Patient should return to office on an as needed basis     Patient expressed understanding of the plan and all questions were answered to the patient's satisfaction.    __________________________________________________________________________   History: Patient is a 57 y.o. male who has been previously seen in the office for symptoms consistent with cervical radiculopathy.  Patient had been having neck pain that radiated into his bilateral shoulders.  After last visit, he started working with physical therapy.  He noted significant improvement with physical therapy.  He said he still has some pain but is mild and tolerable.  He did 7 sessions with PT and since has been doing home exercises.  He is very pleased with how physical therapy helped him with his pain.  He has not developed any new symptoms since last time he was seen in the office.   Previous treatments: Tylenol, Toradol, Robaxin, Lyrica, norco, prednisone, PT     Physical Exam:   General: no acute distress, appears stated age Neurologic: alert, answering questions appropriately, following commands Respiratory: unlabored breathing on room air, symmetric chest rise Psychiatric: appropriate affect, normal cadence to speech     MSK (spine):     -Strength exam                                                   Left                  Right Grip strength                5/5                  5/5 Interosseus                  5/5                   5/5 Wrist extension            5/5                  5/5 Wrist flexion                 5/5                  5/5 Elbow flexion                5/5                  5/5 Deltoid                          5/5                  5/5   -Sensory exam  Sensation intact to light touch in C5-T1 nerve distributions of bilateral upper extremities    Imaging: XR of the cervical spine from 08/12/2022 was previously independently reviewed and interpreted, showing early anterior osteophyte formation seen at C4/5.  Disc height loss with anterior osteophyte formation at C5/6.  No fracture or dislocation seen.  No evidence of instability on flexion/extension views.     Patient name: Xavier Daniels Patient MRN: [id number] Date of visit: 01/20/23

## 2023-01-23 ENCOUNTER — Ambulatory Visit
Admission: EM | Admit: 2023-01-23 | Discharge: 2023-01-23 | Disposition: A | Discharge status: Home / Self Care | Payer: Medicaid Other | Attending: Nurse Practitioner | Admitting: Nurse Practitioner

## 2023-01-23 ENCOUNTER — Other Ambulatory Visit: Payer: Self-pay

## 2023-01-23 DIAGNOSIS — M5412 Radiculopathy, cervical region: Secondary | ICD-10-CM | POA: Diagnosis not present

## 2023-01-23 DIAGNOSIS — Z8739 Personal history of other diseases of the musculoskeletal system and connective tissue: Secondary | ICD-10-CM | POA: Diagnosis not present

## 2023-01-23 DIAGNOSIS — E1169 Type 2 diabetes mellitus with other specified complication: Secondary | ICD-10-CM

## 2023-01-23 MED ORDER — DEXAMETHASONE SODIUM PHOSPHATE 10 MG/ML IJ SOLN
10.0000 mg | INTRAMUSCULAR | Status: AC
  Administered 2023-01-23: 10 mg via INTRAMUSCULAR

## 2023-01-23 MED ORDER — METFORMIN HCL ER 500 MG PO TB24
1000.0000 mg | ORAL_TABLET | Freq: Every day | ORAL | 1 refills | Status: DC
Start: 2023-01-23 — End: 2023-02-03

## 2023-01-23 MED ORDER — KETOROLAC TROMETHAMINE 30 MG/ML IJ SOLN
30.0000 mg | Freq: Once | INTRAMUSCULAR | Status: AC
  Administered 2023-01-23: 30 mg via INTRAMUSCULAR

## 2023-01-23 NOTE — ED Provider Notes (Signed)
[facility]    CSN: [id number] Arrival date & time: 01/23/23  1311      History   Chief Complaint No chief complaint on file.   HPI Xavier Daniels is a 57 y.o. male.   The history is provided by the patient.   Patient presents for complaints of pain between his shoulder blades that been present for the past several months.  Patient recently has been seen by orthopedics, and completed 7 sessions of PT.  He states during that time, his symptoms improved, but once he completed the PT, symptoms resumed.  He states that he was told he may have a "pinched nerve."  He denies neck pain, decreased range of motion, numbness, tingling, or pain in his upper extremities.  Patient has tried Tylenol, Toradol, prednisone, Lyrica, and Robaxin in the past for his symptoms.  Had the best result when he completed PT per his orthopedic note dated 01/20/2023.  Patient reports he has not called his orthopedic provider for his continued symptoms.  Past Medical History:  Diagnosis Date   Arthritis    Colon cancer screening 10/20/2018   Diabetes mellitus without complication (HCC)    Gout    Gout    Hypertension    Substance abuse (HCC)    ETOH, 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, CLEAN FOR 13 YEARS    Patient Active Problem List   Diagnosis Date Noted   Chronic pain of left knee 12/14/2019   Depression, major, single episode, mild (HCC) 12/14/2019   Left ankle pain 12/14/2019   T2DM (type 2 diabetes mellitus) (HCC) 09/10/2019   Essential hypertension 06/06/2014   Hyperlipidemia 06/06/2014   Gout 06/06/2014   Obesity 06/06/2014    Past Surgical History:  Procedure Laterality Date   BIOPSY  04/20/2019   Procedure: BIOPSY;  Surgeon: West Bali, MD;  Location: [facility];  Service: Endoscopy;;  transverse polyp x1   COLONOSCOPY WITH PROPOFOL N/A 04/20/2019   Procedure: COLONOSCOPY WITH PROPOFOL;  Surgeon: West Bali, MD;  Location: [facility];  Service: Endoscopy;  Laterality: N/A;   9:30am-office rescheduled 04/20/19 @ 8:30am   POLYPECTOMY  04/20/2019   Procedure: POLYPECTOMY;  Surgeon: West Bali, MD;  Location: [facility];  Service: Endoscopy;;  transversex1, rectal polyp   TOTAL HIP ARTHROPLASTY  2007   due to injury for avascular necrosis.       Home Medications    Prior to Admission medications   Medication Sig Start Date End Date Taking? Authorizing Provider  allopurinol (ZYLOPRIM) 300 MG tablet TAKE ONE (1) TABLET BY MOUTH EVERY DAY 01/16/23   Donita Brooks, MD  amLODipine (NORVASC) 10 MG tablet Take 1 tablet (10 mg total) by mouth daily. This replaces atenolol 02/22/22   Donita Brooks, MD  colchicine 0.6 MG tablet Take 1 tablet (0.6 mg total) by mouth 2 (two) times daily as needed (gout). 02/15/21   Donita Brooks, MD  diclofenac Sodium (VOLTAREN) 1 % GEL APPLY 2 GRAMS FOUR TIMES A DAY 09/10/22   Donita Brooks, MD  empagliflozin (JARDIANCE) 25 MG TABS tablet TAKE ONE (1) TABLET BY MOUTH EVERY DAY BEFORE BREAKFAST 02/22/22   Donita Brooks, MD  HYDROcodone-acetaminophen (NORCO) 7.5-325 MG tablet Take 1 tablet by mouth every 6 (six) hours as needed for moderate pain. 01/16/23   Donita Brooks, MD  ibuprofen (ADVIL) 800 MG tablet TAKE ONE TABLET BY MOUTH EVERY EIGHT HOURS AS NEEDED 07/03/22   Donita Brooks, MD  levocetirizine (XYZAL ALLERGY 24HR) 5 MG tablet Take 1 tablet (5 mg total) by mouth every evening. 09/03/21   Donita Brooks, MD  lidocaine (LIDODERM) 5 % Place 1 patch onto the skin daily. Remove & Discard patch within 12 hours or as directed by MD 09/19/22   Gilda Crease, MD  losartan (COZAAR) 100 MG tablet Take 1 tablet (100 mg total) by mouth daily. 02/22/22   Donita Brooks, MD  methocarbamol (ROBAXIN-750) 750 MG tablet Take 1 tablet (750 mg total) by mouth every 8 (eight) hours as needed for muscle spasms (discontinue tizanidine). 05/14/22   Donita Brooks, MD  methylPREDNISolone (MEDROL DOSEPAK) 4 MG TBPK tablet  As directed 09/19/22   Gilda Crease, MD  pravastatin (PRAVACHOL) 40 MG tablet Take 1 tablet (40 mg total) by mouth daily. 02/22/22   Donita Brooks, MD  predniSONE (DELTASONE) 20 MG tablet 3 tabs poqday 1-2, 2 tabs poqday 3-4, 1 tab poqday 5-6 05/10/22   Donita Brooks, MD  pregabalin (LYRICA) 100 MG capsule TAKE ONE CAPSULE (100MG  TOTAL) BY MOUTH THREE TIMES DAILY 11/12/22   Donita Brooks, MD  sildenafil (VIAGRA) 100 MG tablet TAKE ONE-HALF TO ONE TABLET (50 TO 100MG TOTAL) BY MOUTH DAILY AS NEEDED FOR ERECTILE DYSFUNCTION 07/08/22   Donita Brooks, MD    Family History Family History  Problem Relation Age of Onset   Diabetes Mother    Diabetes Brother     Social History Social History   Tobacco Use   Smoking status: Never   Smokeless tobacco: Never  Vaping Use   Vaping status: Never Used  Substance Use Topics   Alcohol use: No    Comment: previous heavy use, quit 2004   Drug use: No    Comment: previous marijuana use     Allergies   Patient has no known allergies.   Review of Systems Review of Systems Per HPI  Physical Exam Triage Vital Signs ED Triage Vitals [01/23/23 1320]  Encounter Vitals Group     BP 138/80     Systolic BP Percentile      Diastolic BP Percentile      Pulse Rate 93     Resp 18     Temp 98 F (36.7 C)     Temp Source Oral     SpO2 97 %     Weight      Height      Head Circumference      Peak Flow      Pain Score 8     Pain Loc      Pain Education      Exclude from Growth Chart    No data found.  Updated Vital Signs BP 138/80 (BP Location: Right Arm)   Pulse 93   Temp 98 F (36.7 C) (Oral)   Resp 18   SpO2 97%   Visual Acuity Right Eye Distance:   Left Eye Distance:   Bilateral Distance:    Right Eye Near:   Left Eye Near:    Bilateral Near:     Physical Exam Vitals and nursing note reviewed.  Constitutional:      General: He is not in acute distress.    Appearance: Normal appearance.  HENT:      Head: Normocephalic.  Eyes:     Extraocular Movements: Extraocular movements intact.     Pupils: Pupils are equal, round, and reactive to light.  Pulmonary:     Effort: Pulmonary effort  is normal.  Musculoskeletal:     Left shoulder: Normal.       Arms:     Cervical back: Full passive range of motion without pain and normal range of motion. No pain with movement or muscular tenderness. Normal range of motion.  Lymphadenopathy:     Cervical: No cervical adenopathy.  Skin:    General: Skin is warm and dry.  Neurological:     General: No focal deficit present.     Mental Status: He is alert and oriented to person, place, and time.  Psychiatric:        Mood and Affect: Mood normal.        Behavior: Behavior normal.      UC Treatments / Results  Labs (all labs ordered are listed, but only abnormal results are displayed) Labs Reviewed - No data to display  EKG   Radiology No results found.  Procedures Procedures (including critical care time)  Medications Ordered in UC Medications  ketorolac (TORADOL) 30 MG/ML injection 30 mg (30 mg Intramuscular Given 01/23/23 1355)  dexamethasone (DECADRON) injection 10 mg (10 mg Intramuscular Given 01/23/23 1355)    Initial Impression / Assessment and Plan / UC Course  I have reviewed the triage vital signs and the nursing notes.  Pertinent labs & imaging results that were available during my care of the patient were reviewed by me and considered in my medical decision making (see chart for details).  Patient is well-appearing, he is in no acute distress, vital signs are stable.  The patient is well-appearing, he is in no acute distress, vital signs are stable.  Patient with point tenderness noted to the left rhomboid region.  No decreased range of motion or pain in the left upper extremity or neck.  Review of the patient's chart shows x-ray of the cervical spine performed on 08/12/2022 showed early anterior osteophyte formation seen  at C4/5.  He also has some disc height loss at C5/6.  Improvement noted after completing PT.  Symptoms consistent with continued radiculopathy of the cervical region.  Decadron 10 mg IM and Toradol 30 mg IM administered for pain and inflammation.  Supportive care recommendations were provided and discussed with the patient to include over-the-counter analgesics, the use of ice or heat, and continuing home exercises provided by PT.  Patient advised to follow-up with orthopedics to request additional PT appointments.  Patient is in agreement with this plan of care and verbalizes understanding.  All questions were answered.  Patient stable for discharge.  Final Clinical Impressions(s) / UC Diagnoses   Final diagnoses:  Cervical radiculopathy  History of back pain     Discharge Instructions      You were given injections of Toradol 30 mg and Decadron 10 mg.  Do not take any additional ibuprofen, Aleve, or Advil today.  You can take over-the-counter Tylenol as needed for breakthrough pain or discomfort. Recommend the use of ice or heat.  Apply ice for pain or swelling, heat for spasm or stiffness.  Apply for 20 minutes, remove for 1 hour, repeat as needed. Continue the physical therapy exercises previously provided.  You should perform those 2-3 times per day while symptoms persist. As discussed, there is recommended that you follow-up with orthopedics to discuss additional physical therapy sessions. Follow-up as needed.     ED Prescriptions   None    PDMP not reviewed this encounter.   Abran Cantor, NP 01/23/23 1402

## 2023-01-23 NOTE — Discharge Instructions (Signed)
You were given injections of Toradol 30 mg and Decadron 10 mg.  Do not take any additional ibuprofen, Aleve, or Advil today.  You can take over-the-counter Tylenol as needed for breakthrough pain or discomfort. Recommend the use of ice or heat.  Apply ice for pain or swelling, heat for spasm or stiffness.  Apply for 20 minutes, remove for 1 hour, repeat as needed. Continue the physical therapy exercises previously provided.  You should perform those 2-3 times per day while symptoms persist. As discussed, there is recommended that you follow-up with orthopedics to discuss additional physical therapy sessions. Follow-up as needed.

## 2023-01-23 NOTE — ED Triage Notes (Signed)
Pt reports he has been having upper back pain x 2 days Has had this problem in the past

## 2023-01-24 ENCOUNTER — Telehealth: Payer: Self-pay | Admitting: Orthopedic Surgery

## 2023-01-24 DIAGNOSIS — M5412 Radiculopathy, cervical region: Secondary | ICD-10-CM

## 2023-01-24 NOTE — Telephone Encounter (Signed)
Patient called. He would like  referral for PT. He would like to go to [facility]. His call back number is [phone]

## 2023-01-27 ENCOUNTER — Other Ambulatory Visit: Payer: Self-pay | Admitting: Radiology

## 2023-01-27 DIAGNOSIS — M542 Cervicalgia: Secondary | ICD-10-CM

## 2023-01-27 DIAGNOSIS — M5412 Radiculopathy, cervical region: Secondary | ICD-10-CM

## 2023-01-30 ENCOUNTER — Other Ambulatory Visit: Payer: Self-pay

## 2023-01-30 ENCOUNTER — Ambulatory Visit (HOSPITAL_COMMUNITY): Payer: Medicaid Other | Attending: Orthopedic Surgery

## 2023-01-30 DIAGNOSIS — M5412 Radiculopathy, cervical region: Secondary | ICD-10-CM | POA: Insufficient documentation

## 2023-01-30 DIAGNOSIS — M542 Cervicalgia: Secondary | ICD-10-CM

## 2023-01-30 DIAGNOSIS — R252 Cramp and spasm: Secondary | ICD-10-CM | POA: Insufficient documentation

## 2023-01-30 DIAGNOSIS — R293 Abnormal posture: Secondary | ICD-10-CM | POA: Diagnosis present

## 2023-01-30 NOTE — Therapy (Signed)
OUTPATIENT PHYSICAL THERAPY CERVICAL EVALUATION   Patient Name: Xavier Daniels MRN: [id number] DOB:1965-12-29, 57 y.o., male Today's Date: 01/30/2023  END OF SESSION:  PT End of Session - 01/30/23 1348     Visit Number 1    Number of Visits 5    Date for PT Re-Evaluation 02/27/23    Authorization Type [place] Medicaid [employer] (requested 4 visits)    Progress Note Due on Visit 5    PT Start Time 0930    PT Stop Time 1015    PT Time Calculation (min) 45 min    Activity Tolerance Patient tolerated treatment well    Behavior During Therapy WFL for tasks assessed/performed            Past Medical History:  Diagnosis Date   Arthritis    Colon cancer screening 10/20/2018   Diabetes mellitus without complication (HCC)    Gout    Gout    Hypertension    Substance abuse (HCC)    ETOH, 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, CLEAN FOR 13 YEARS   Past Surgical History:  Procedure Laterality Date   BIOPSY  04/20/2019   Procedure: BIOPSY;  Surgeon: West Bali, MD;  Location: [facility];  Service: Endoscopy;;  transverse polyp x1   COLONOSCOPY WITH PROPOFOL N/A 04/20/2019   Procedure: COLONOSCOPY WITH PROPOFOL;  Surgeon: West Bali, MD;  Location: [facility];  Service: Endoscopy;  Laterality: N/A;  9:30am-office rescheduled 04/20/19 @ 8:30am   POLYPECTOMY  04/20/2019   Procedure: POLYPECTOMY;  Surgeon: West Bali, MD;  Location: [facility];  Service: Endoscopy;;  transversex1, rectal polyp   TOTAL HIP ARTHROPLASTY  2007   due to injury for avascular necrosis.   Patient Active Problem List   Diagnosis Date Noted   Chronic pain of left knee 12/14/2019   Depression, major, single episode, mild (HCC) 12/14/2019   Left ankle pain 12/14/2019   T2DM (type 2 diabetes mellitus) (HCC) 09/10/2019   Essential hypertension 06/06/2014   Hyperlipidemia 06/06/2014   Gout 06/06/2014   Obesity 06/06/2014    PCP: Donita Brooks, MD  REFERRING PROVIDER: London Sheer, MD  REFERRING  DIAG: [phone] (ICD-10-CM) - Radiculopathy, cervical region M54.2 (ICD-10-CM) - Neck pain  THERAPY DIAG:  Cervicalgia  Abnormal posture  Rationale for Evaluation and Treatment: Rehabilitation  ONSET DATE: 5-6 months ago  SUBJECTIVE:  SUBJECTIVE STATEMENT: Arrives to the clinic with neck pain and pain on the medial side of the L shoulder blade (see below).  Denies any numbness or tingling on the hands, weakness on the grip, HA or dizziness. Condition started around 5-6 ago without reason and gradually got worse. Denies any form of rx in the past other than PT which helped a lot (was just d/c from a different facility 01/07/23). Decides to continue with PT because he thinks he did not get enough PT. Patient has been doing his HEP as he was directed. Patient was in the ED last week due to exacerbation of pain without hx of trauma. Was given steroids and toradol which significantly helped with the pain Recent MD visit referred patient to outpatient PT evaluation and management. Hand dominance: Right  PERTINENT HISTORY:  Arthritis, HTN  PAIN:  Are you having pain? Yes: NPRS scale: 5/10 Pain location: neck pain and pain on the medial side of L shoulder blade Pain description: aching, nagging Aggravating factors: slouching, a lot of twisting/turning, laying on either sides (10/10 pain) Relieving factors: upright posture (2/10 pain)  PRECAUTIONS: None  RED FLAGS: Bowel or bladder incontinence: No, Cauda equina syndrome: No, and Compression fracture: No     WEIGHT BEARING RESTRICTIONS: No  FALLS:  Has patient fallen in last 6 months? No  LIVING ENVIRONMENT: Lives with: lives with their spouse and lives with their son Lives in: House/apartment Stairs: Yes: External: 1 steps; none Has  following equipment at home: None  OCCUPATION: unemployed  PLOF: Independent and Independent with basic ADLs  PATIENT GOALS: Patient wants to be healed  NEXT MD VISIT: when he finishes with PT.  OBJECTIVE:  Note: Objective measures were completed at Evaluation unless otherwise noted.  DIAGNOSTIC FINDINGS:  08/12/22 XR of the cervical spine from 08/12/2022 was previously independently reviewed and interpreted, showing early anterior osteophyte formation seen at C4/5. Disc height loss with anterior osteophyte formation at C5/6. No fracture or dislocation seen. No evidence of instability on flexion/extension views.  PATIENT SURVEYS:  NDI 14/50 = 28%  COGNITION: Overall cognitive status: Within functional limits for tasks assessed  SENSATION: Not tested  POSTURE: rounded shoulders, forward head, and L scapular winging and slight upward rotation  PALPATION: Grade 1 tenderness and muscle spasm on paracervicals and upper trapezius Moderate restriction on pectorals, upper trapezius, lev scapulae, and suboccipitals Hypomobility of the L scapula towards downward rotation   CERVICAL ROM:   Active ROM A/PROM (deg) eval  Flexion 45  Extension 50*  Right lateral flexion 25  Left lateral flexion 20  Right rotation 60  Left rotation 60   (Blank rows = not tested) *Extension causes pain on the L shoulder blade  UPPER EXTREMITY ROM:  Active ROM Right eval Left eval  Shoulder flexion [facility]  Shoulder extension    Shoulder abduction [facility]  Shoulder adduction [facility]  Shoulder internal rotation [facility] WFL  Shoulder external rotation [facility] WFL  Elbow flexion    Elbow extension    Wrist flexion    Wrist extension    Wrist ulnar deviation    Wrist radial deviation    Wrist pronation    Wrist supination     (Blank rows = not tested)  UPPER EXTREMITY MMT:  MMT Right eval Left eval  Shoulder flexion 5 5  Shoulder extension 5 5  Shoulder abduction 5 5  Shoulder adduction     Shoulder extension    Shoulder internal rotation 5 4-  Shoulder external rotation 5 4-  Middle trapezius 4- 4-  Lower trapezius 4- 4-  Serratus anterior 5 4-  Elbow flexion    Elbow extension    Wrist flexion    Wrist extension    Wrist ulnar deviation    Wrist radial deviation    Wrist pronation    Wrist supination    Grip strength (grossly assessed) 5 5   (Blank rows = not tested)  CERVICAL SPECIAL TESTS:  Spurling's test: Negative and Distraction test: Positive (relieves symptoms) (-) ULTT ulnar, radial and median on B (-) Lift-off sign on B  TODAY'S TREATMENT:                                                                                                                              DATE:  01/30/23 Evaluation and patient education done   PATIENT EDUCATION:  Education details: Educated on the pathoanatomy of neck pain. Educated on the goals and course of rehab. Person educated: Patient Education method: Explanation Education comprehension: verbalized understanding  HOME EXERCISE PROGRAM: None provided to date  ASSESSMENT:  CLINICAL IMPRESSION: Patient is a 58 y.o. male who was seen today for physical therapy evaluation and treatment for cervical radiculopathy. Patient was diagnosed with cervical radiculopathy by referring provider further defined by difficulty with turning, laying on the side and maintaining upright posture  due to pain, weakness, and decreased soft tissue extensibility. Skilled PT is required to address the impairments and functional limitations listed below.    OBJECTIVE IMPAIRMENTS: decreased activity tolerance, decreased ROM, decreased strength, hypomobility, impaired flexibility, postural dysfunction, and pain.   ACTIVITY LIMITATIONS: carrying, lifting, bending, sitting, standing, and sleeping  PARTICIPATION LIMITATIONS: meal prep, cleaning, laundry, and community activity  PERSONAL FACTORS: Time since onset of injury/illness/exacerbation are  also affecting patient's functional outcome.   REHAB POTENTIAL: Good  CLINICAL DECISION MAKING: Stable/uncomplicated  EVALUATION COMPLEXITY: Low   GOALS: Goals reviewed with patient? Yes  SHORT TERM GOALS: Target date: 02/13/23  Pt will demonstrate indep in HEP to facilitate carry-over of skilled services and improve functional outcomes  Goal status: INITIAL   LONG TERM GOALS: Target date: 02/27/23  Pt will demonstrate a decrease in NDI score by 19% to demonstrate significant improvement in ADLs Baseline: 28% Goal status: INITIAL  2.  Pt will demonstrate increase in cervical flexion ROM by 20 degrees  to facilitate ease in ADLs Baseline: 45 degrees Goal status: INITIAL  3.  Patient will demonstrate increase in UE strength to 4+/5 to facilitate ease in ADLs  Baseline: 4-/5 Goal status: INITIAL  PLAN:  PT FREQUENCY: 1x/week  PT DURATION: 4 weeks  PLANNED INTERVENTIONS: 97164- PT Re-evaluation, 97110-Therapeutic exercises, 97530- Therapeutic activity, 97112- Neuromuscular re-education, 97535- Self Care, 78295- Manual therapy, 97014- Electrical stimulation (unattended), 97035- Ultrasound, Taping, Dry Needling, Cryotherapy, and Moist heat  PLAN FOR NEXT SESSION: Provide HEP. Begin postural and cervical strengthening which includes lower trapezius and serratus anterior. Also work on UE  and cervical flexibility and mobility.   Tish Frederickson. Trentyn Boisclair, PT, DPT, OCS Board-Certified Clinical Specialist in Orthopedic PT PT Compact Privilege # ([place]): [id number] T 01/30/2023, 1:51 PM

## 2023-02-03 ENCOUNTER — Ambulatory Visit: Payer: Medicaid Other | Admitting: Family Medicine

## 2023-02-03 ENCOUNTER — Encounter: Payer: Self-pay | Admitting: Family Medicine

## 2023-02-03 VITALS — BP 120/76 | HR 87 | Temp 97.7°F | Ht 73.0 in | Wt 251.6 lb

## 2023-02-03 DIAGNOSIS — E1169 Type 2 diabetes mellitus with other specified complication: Secondary | ICD-10-CM

## 2023-02-03 DIAGNOSIS — Z7984 Long term (current) use of oral hypoglycemic drugs: Secondary | ICD-10-CM | POA: Diagnosis not present

## 2023-02-03 MED ORDER — SEMAGLUTIDE(0.25 OR 0.5MG/DOS) 2 MG/3ML ~~LOC~~ SOPN: 0.5000 mg | PEN_INJECTOR | SUBCUTANEOUS | 1 refills | Status: DC

## 2023-02-03 NOTE — Progress Notes (Signed)
Subjective:    Patient ID: Xavier Daniels, male    DOB: 1965-11-27, 57 y.o.   MRN: [id number]  HPI 01/16/23 Patient has previously been seeing physical therapy for cervical radiculopathy and pain radiating down his left arm.  He states that physical therapy has helped substantially with this.  He denies any current pain in his neck or pain radiating down his arm.  He requested a pain prescription refill.  I did refill the prescription for 30 tablets but I will ask him to start using this sparingly only for flareups.  I would like that prescription to start the last 3 to 4 months.  He denies any chest pain or shortness of breath.  His last hemoglobin A1c was 6.9 in January.  He is currently taking Jardiance.  He denies any yeast infections.  He denies any dysuria.  Has been more than a year since his last eye exam.  Diabetic foot exam was performed today and was normal.  At that time, my plan was: We will start to wean the patient away from hydrocodone.  Patient received flu shot today.  Blood pressure is outstanding.  Check globin A1c.  Goal globin A1c is less than 6.5.  Check LDL cholesterol.  Goal LDL cholesterol is less than 100.  Check urine protein to creatinine ratio.  Diabetic foot exam was performed and was normal.  Recommended annual diabetic eye exam.  02/03/23 Hemoglobin A1c at that visit was 7.7 despite being on Jardiance.  Therefore metformin extended release was started.  Patient has been taking 1000 mg every morning however he is unable to tolerate it.  After 3 weeks he continues to have severe diarrhea.  He states that he cannot even leave the house if he takes the metformin.  He is questioning if there are any other medication options. Past Medical History:  Diagnosis Date   Arthritis    Colon cancer screening 10/20/2018   Diabetes mellitus without complication (HCC)    Gout    Gout    Hypertension    Substance abuse (HCC)    ETOH, 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, CLEAN FOR 13 YEARS   Past  Surgical History:  Procedure Laterality Date   BIOPSY  04/20/2019   Procedure: BIOPSY;  Surgeon: West Bali, MD;  [facility];  Service: Endoscopy;;  transverse polyp x1   COLONOSCOPY WITH PROPOFOL N/A 04/20/2019   Procedure: COLONOSCOPY WITH PROPOFOL;  Surgeon: West Bali, MD;  [facility];  Service: Endoscopy;  Laterality: N/A;  9:30am-office rescheduled 04/20/19 @ 8:30am   POLYPECTOMY  04/20/2019   Procedure: POLYPECTOMY;  Surgeon: West Bali, MD;  [facility];  Service: Endoscopy;;  transversex1, rectal polyp   TOTAL HIP ARTHROPLASTY  2007   due to injury for avascular necrosis.   Current Outpatient Medications on File Prior to Visit  Medication Sig Dispense Refill   allopurinol (ZYLOPRIM) 300 MG tablet TAKE ONE (1) TABLET BY MOUTH EVERY DAY 90 tablet 0   amLODipine (NORVASC) 10 MG tablet Take 1 tablet (10 mg total) by mouth daily. This replaces atenolol 90 tablet 3   colchicine 0.6 MG tablet Take 1 tablet (0.6 mg total) by mouth 2 (two) times daily as needed (gout). 30 tablet 1   diclofenac Sodium (VOLTAREN) 1 % GEL APPLY 2 GRAMS FOUR TIMES A DAY 100 g 0   empagliflozin (JARDIANCE) 25 MG TABS tablet TAKE ONE (1) TABLET BY MOUTH EVERY DAY BEFORE BREAKFAST 90 tablet 3  HYDROcodone-acetaminophen (NORCO) 7.5-325 MG tablet Take 1 tablet by mouth every 6 (six) hours as needed for moderate pain. 30 tablet 0   ibuprofen (ADVIL) 800 MG tablet TAKE ONE TABLET BY MOUTH EVERY EIGHT HOURS AS NEEDED 60 tablet 1   levocetirizine (XYZAL ALLERGY 24HR) 5 MG tablet Take 1 tablet (5 mg total) by mouth every evening. 30 tablet 5   lidocaine (LIDODERM) 5 % Place 1 patch onto the skin daily. Remove & Discard patch within 12 hours or as directed by MD 30 patch 0   losartan (COZAAR) 100 MG tablet Take 1 tablet (100 mg total) by mouth daily. 90 tablet 3   metFORMIN (GLUCOPHAGE-XR) 500 MG 24 hr tablet Take 2 tablets (1,000 mg total) by mouth daily with breakfast. 180  tablet 1   pravastatin (PRAVACHOL) 40 MG tablet Take 1 tablet (40 mg total) by mouth daily. 90 tablet 3   pregabalin (LYRICA) 100 MG capsule TAKE ONE CAPSULE (100MG  TOTAL) BY MOUTH THREE TIMES DAILY 90 capsule 3   sildenafil (VIAGRA) 100 MG tablet TAKE ONE-HALF TO ONE TABLET (50 TO 100MG TOTAL) BY MOUTH DAILY AS NEEDED FOR ERECTILE DYSFUNCTION 5 tablet 3   No current facility-administered medications on file prior to visit.     No Known Allergies Social History   Socioeconomic History   Marital status: Married    Spouse name: Not on file   Number of children: Not on file   Years of education: Not on file   Highest education level: 12th grade  Occupational History   Not on file  Tobacco Use   Smoking status: Never   Smokeless tobacco: Never  Vaping Use   Vaping status: Never Used  Substance and Sexual Activity   Alcohol use: No    Comment: previous heavy use, quit 2004   Drug use: No    Comment: previous marijuana use   Sexual activity: Yes    Birth control/protection: None  Other Topics Concern   Not on file  Social History Narrative   Not on file   Social Determinants of Health   Financial Resource Strain: Low Risk  (01/30/2023)   Overall Financial Resource Strain (CARDIA)    Difficulty of Paying Living Expenses: Not very hard  Food Insecurity: No Food Insecurity (01/30/2023)   Hunger Vital Sign    Worried About Running Out of Food in the Last Year: Never true    Ran Out of Food in the Last Year: Never true  Transportation Needs: No Transportation Needs (01/30/2023)   PRAPARE - Administrator, Civil Service (Medical): No    Lack of Transportation (Non-Medical): No  Physical Activity: Sufficiently Active (01/30/2023)   Exercise Vital Sign    Days of Exercise per Week: 5 days    Minutes of Exercise per Session: 60 min  Stress: No Stress Concern Present (01/30/2023)   [employer] of Occupational Health - Occupational Stress Questionnaire     Feeling of Stress : Not at all  Social Connections: Socially Integrated (01/30/2023)   Social Connection and Isolation Panel [NHANES]    Frequency of Communication with Friends and Family: More than three times a week    Frequency of Social Gatherings with Friends and Family: More than three times a week    Attends Religious Services: More than 4 times per year    Active Member of [employer] or Organizations: Yes    Attends Engineer, structural: More than 4 times per year    Marital Status: Married  Intimate Partner Violence: Not on file      Review of Systems  All other systems reviewed and are negative.      Objective:   Physical Exam Vitals reviewed.  Constitutional:      General: He is not in acute distress.    Appearance: Normal appearance. He is not ill-appearing or toxic-appearing.  HENT:     Mouth/Throat:     Pharynx: No oropharyngeal exudate or posterior oropharyngeal erythema.  Cardiovascular:     Rate and Rhythm: Normal rate and regular rhythm.     Pulses: Normal pulses.     Heart sounds: Normal heart sounds. No murmur heard.    No friction rub. No gallop.  Pulmonary:     Effort: Pulmonary effort is normal. No respiratory distress.     Breath sounds: Normal breath sounds. No stridor. No wheezing, rhonchi or rales.  Musculoskeletal:     Right lower leg: No edema.     Left lower leg: No edema.  Lymphadenopathy:     Cervical: No cervical adenopathy.  Neurological:     Mental Status: He is alert.          Assessment & Plan:  Type 2 diabetes mellitus with other specified complication, without long-term current use of insulin (HCC) Patient has made dietary changes.  He would also like to try Ozempic.  Discontinue metformin and replace with Ozempic 0.5 mg subcu weekly and uptitrate to 1 mg in 1 month if tolerated.  Recheck A1c in 4 months.

## 2023-02-04 ENCOUNTER — Ambulatory Visit (HOSPITAL_COMMUNITY): Payer: Medicaid Other | Admitting: Physical Therapy

## 2023-02-04 DIAGNOSIS — R293 Abnormal posture: Secondary | ICD-10-CM

## 2023-02-04 DIAGNOSIS — M542 Cervicalgia: Secondary | ICD-10-CM

## 2023-02-04 DIAGNOSIS — R252 Cramp and spasm: Secondary | ICD-10-CM

## 2023-02-04 NOTE — Therapy (Signed)
OUTPATIENT PHYSICAL THERAPY CERVICAL TREATMENT   Patient Name: Xavier Daniels MRN: [id number] DOB:[birth year], 57 y.o., male Today's Date: 02/04/2023  END OF SESSION:  PT End of Session - 02/04/23 0932     Visit Number 2    Number of Visits 5    Date for PT Re-Evaluation 02/27/23    Authorization Type [place] Medicaid Healthy Blue    Authorization Time Period 6 visits approved 10/18-12/16    Authorization - Visit Number 1    Authorization - Number of Visits 6    Progress Note Due on Visit 5    PT Start Time 0850    PT Stop Time 0930    PT Time Calculation (min) 40 min    Activity Tolerance Patient tolerated treatment well    Behavior During Therapy [facility] for tasks assessed/performed             Past Medical History:  Diagnosis Date   Arthritis    Colon cancer screening 10/20/2018   Diabetes mellitus without complication (HCC)    Gout    Gout    Hypertension    Substance abuse (HCC)    ETOH, 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, CLEAN FOR 13 YEARS   Past Surgical History:  Procedure Laterality Date   BIOPSY  04/20/2019   Procedure: BIOPSY;  Surgeon: West Bali, MD;  [facility];  Service: Endoscopy;;  transverse polyp x1   COLONOSCOPY WITH PROPOFOL N/A 04/20/2019   Procedure: COLONOSCOPY WITH PROPOFOL;  Surgeon: West Bali, MD;  [facility];  Service: Endoscopy;  Laterality: N/A;  9:30am-office rescheduled 04/20/19 @ 8:30am   POLYPECTOMY  04/20/2019   Procedure: POLYPECTOMY;  Surgeon: West Bali, MD;  [facility];  Service: Endoscopy;;  transversex1, rectal polyp   TOTAL HIP ARTHROPLASTY  2007   due to injury for avascular necrosis.   Patient Active Problem List   Diagnosis Date Noted   Chronic pain of left knee 12/14/2019   Depression, major, single episode, mild (HCC) 12/14/2019   Left ankle pain 12/14/2019   T2DM (type 2 diabetes mellitus) (HCC) 09/10/2019   Essential hypertension 06/06/2014   Hyperlipidemia 06/06/2014   Gout 06/06/2014    Obesity 06/06/2014    PCP: Donita Brooks, MD  REFERRING PROVIDER: London Sheer, MD  REFERRING DIAG: M54.12 (ICD-10-CM) - Radiculopathy, cervical region M54.2 (ICD-10-CM) - Neck pain  THERAPY DIAG:  No diagnosis found.  Rationale for Evaluation and Treatment: Rehabilitation  ONSET DATE: 5-6 months ago  SUBJECTIVE:  SUBJECTIVE STATEMENT: Pt states he currently does not have any pain, however was 7-8/10 last night.  States he's been working on the exercises given to him by PT in [place] (was not given HEP at evaluation).    Evaluation:  Arrives to the clinic with neck pain and pain on the medial side of the L shoulder blade (see below).  Denies any numbness or tingling on the hands, weakness on the grip, HA or dizziness. Condition started around 5-6 ago without reason and gradually got worse. Denies any form of rx in the past other than PT which helped a lot (was just d/c from a different facility 01/07/23). Decides to continue with PT because he thinks he did not get enough PT. Patient has been doing his HEP as he was directed. Patient was in the ED last week due to exacerbation of pain without hx of trauma. Was given steroids and toradol which significantly helped with the pain Recent MD visit referred patient to outpatient PT evaluation and management. Hand dominance: Right  PERTINENT HISTORY:  Arthritis, HTN  PAIN:  Are you having pain? Yes: NPRS scale: 5/10 Pain location: neck pain and pain on the medial side of L shoulder blade Pain description: aching, nagging Aggravating factors: slouching, a lot of twisting/turning, laying on either sides (10/10 pain) Relieving factors: upright posture (2/10 pain)  PRECAUTIONS: None  RED FLAGS: Bowel or bladder incontinence: No,  Cauda equina syndrome: No, and Compression fracture: No     WEIGHT BEARING RESTRICTIONS: No  FALLS:  Has patient fallen in last 6 months? No  LIVING ENVIRONMENT: Lives with: lives with their spouse and lives with their son Lives in: House/apartment Stairs: Yes: External: 1 steps; none Has following equipment at home: None  OCCUPATION: unemployed  PLOF: Independent and Independent with basic ADLs  PATIENT GOALS: Patient wants to be healed  NEXT MD VISIT: when he finishes with PT.  OBJECTIVE:  Note: Objective measures were completed at Evaluation unless otherwise noted.  DIAGNOSTIC FINDINGS:  08/12/22 XR of the cervical spine from 08/12/2022 was previously independently reviewed and interpreted, showing early anterior osteophyte formation seen at C4/5. Disc height loss with anterior osteophyte formation at C5/6. No fracture or dislocation seen. No evidence of instability on flexion/extension views.  PATIENT SURVEYS:  NDI 14/50 = 28%  COGNITION: Overall cognitive status: Within functional limits for tasks assessed  SENSATION: Not tested  POSTURE: rounded shoulders, forward head, and L scapular winging and slight upward rotation  PALPATION: Grade 1 tenderness and muscle spasm on paracervicals and upper trapezius Moderate restriction on pectorals, upper trapezius, lev scapulae, and suboccipitals Hypomobility of the L scapula towards downward rotation   CERVICAL ROM:   Active ROM A/PROM (deg) eval  Flexion 45  Extension 50*  Right lateral flexion 25  Left lateral flexion 20  Right rotation 60  Left rotation 60   (Blank rows = not tested) *Extension causes pain on the L shoulder blade  UPPER EXTREMITY ROM:  Active ROM Right eval Left eval  Shoulder flexion [facility]  Shoulder extension    Shoulder abduction [facility]  Shoulder adduction [facility]  Shoulder internal rotation [facility]  Shoulder external rotation [facility]  Elbow flexion    Elbow extension    Wrist  flexion    Wrist extension    Wrist ulnar deviation    Wrist radial deviation    Wrist pronation    Wrist supination     (Blank rows = not tested)  UPPER  EXTREMITY MMT:  MMT Right eval Left eval  Shoulder flexion 5 5  Shoulder extension 5 5  Shoulder abduction 5 5  Shoulder adduction    Shoulder extension    Shoulder internal rotation 5 4-  Shoulder external rotation 5 4-  Middle trapezius 4- 4-  Lower trapezius 4- 4-  Serratus anterior 5 4-  Elbow flexion    Elbow extension    Wrist flexion    Wrist extension    Wrist ulnar deviation    Wrist radial deviation    Wrist pronation    Wrist supination    Grip strength (grossly assessed) 5 5   (Blank rows = not tested)  CERVICAL SPECIAL TESTS:  Spurling's test: Negative and Distraction test: Positive (relieves symptoms) (-) ULTT ulnar, radial and median on B (-) Lift-off sign on B  TODAY'S TREATMENT:                                                                                                                              DATE:  02/04/23 Exercises - Scapular Protraction at Wall  10 reps - Wall Push Up 10 reps - Serratus Activation at Wall with Foam Roll  10 reps - Shoulder External Rotation and Scapular Retraction with GTB 10 reps - Standing Shoulder Row with  GTB 10 reps - Shoulder extension with GTB 10 reps - Alternating Shoulder Flexion Overhead with GTB 10 reps   01/30/23 Evaluation and patient education done   PATIENT EDUCATION:  Education details: Educated on the pathoanatomy of neck pain. Educated on the goals and course of rehab. Person educated: Patient Education method: Explanation Education comprehension: verbalized understanding  HOME EXERCISE PROGRAM: Evaluation: None provided to date  Access Code: [email or web address] URL: [email or web address] Date: 02/04/2023 Prepared by: Emeline Gins  Exercises - Scapular Protraction at Wall  - 2 x daily - 7 x weekly - 2 sets - 10 reps - Wall Push  Up  - 2 x daily - 7 x weekly - 2 sets - 10 reps - Serratus Activation at Wall with Foam Roll and Resistance Band  - 2 x daily - 7 x weekly - 2 sets - 10 reps - Shoulder External Rotation and Scapular Retraction with Resistance  - 2 x daily - 7 x weekly - 2 sets - 10 reps - Standing Shoulder Row with Anchored Resistance  - 2 x daily - 7 x weekly - 2 sets - 10 reps - Shoulder extension with resistance - Neutral  - 2 x daily - 7 x weekly - 2 sets - 10 reps - Alternating Shoulder Flexion Overhead with Resistance  - 2 x daily - 7 x weekly - 2 sets - 10 reps  ASSESSMENT:  CLINICAL IMPRESSION: Reviewed goals and POC moving forward.  Initiated new exercises to target serratus and lower trap mm as well as postural correction.  Pt able to complete all exercises without c/o pain and overall good form with minimal cues.  Pt given HEP along with green theraband to complete with HEP.  Pt will continue to benefit from skilled therapy to address deficits and improve overall functional level.     OBJECTIVE IMPAIRMENTS: decreased activity tolerance, decreased ROM, decreased strength, hypomobility, impaired flexibility, postural dysfunction, and pain.   ACTIVITY LIMITATIONS: carrying, lifting, bending, sitting, standing, and sleeping  PARTICIPATION LIMITATIONS: meal prep, cleaning, laundry, and community activity  PERSONAL FACTORS: Time since onset of injury/illness/exacerbation are also affecting patient's functional outcome.   REHAB POTENTIAL: Good  CLINICAL DECISION MAKING: Stable/uncomplicated  EVALUATION COMPLEXITY: Low   GOALS: Goals reviewed with patient? Yes  SHORT TERM GOALS: Target date: 02/13/23  Pt will demonstrate indep in HEP to facilitate carry-over of skilled services and improve functional outcomes  Goal status: INITIAL   LONG TERM GOALS: Target date: 02/27/23  Pt will demonstrate a decrease in NDI score by 19% to demonstrate significant improvement in ADLs Baseline: 28% Goal  status: INITIAL  2.  Pt will demonstrate increase in cervical flexion ROM by 20 degrees  to facilitate ease in ADLs Baseline: 45 degrees Goal status: INITIAL  3.  Patient will demonstrate increase in UE strength to 4+/5 to facilitate ease in ADLs  Baseline: 4-/5 Goal status: INITIAL  PLAN:  PT FREQUENCY: 1x/week  PT DURATION: 4 weeks  PLANNED INTERVENTIONS: 97164- PT Re-evaluation, 97110-Therapeutic exercises, 97530- Therapeutic activity, 97112- Neuromuscular re-education, 97535- Self Care, 11914- Manual therapy, 97014- Electrical stimulation (unattended), 97035- Ultrasound, Taping, Dry Needling, Cryotherapy, and Moist heat  PLAN FOR NEXT SESSION: progress postural and cervical strengthening which includes lower trapezius and serratus anterior. Also work on UE and cervical flexibility and mobility.   Lurena Nida, PTA/CLT [facility] Ph: [phone]   Lurena Nida, PTA 02/04/2023, 10:21 AM

## 2023-02-11 ENCOUNTER — Ambulatory Visit (HOSPITAL_COMMUNITY): Payer: Medicaid Other | Admitting: Physical Therapy

## 2023-02-11 ENCOUNTER — Other Ambulatory Visit: Payer: Self-pay | Admitting: Family Medicine

## 2023-02-12 ENCOUNTER — Ambulatory Visit: Payer: Medicaid Other | Admitting: Orthopedic Surgery

## 2023-02-12 ENCOUNTER — Ambulatory Visit (HOSPITAL_COMMUNITY): Payer: Medicaid Other

## 2023-02-12 DIAGNOSIS — M542 Cervicalgia: Secondary | ICD-10-CM | POA: Diagnosis not present

## 2023-02-12 DIAGNOSIS — R293 Abnormal posture: Secondary | ICD-10-CM

## 2023-02-12 DIAGNOSIS — R252 Cramp and spasm: Secondary | ICD-10-CM

## 2023-02-12 NOTE — Telephone Encounter (Signed)
Requested Prescriptions  Pending Prescriptions Disp Refills   JARDIANCE 25 MG TABS tablet [Pharmacy Med Name: JARDIANCE 25 MG TAB] 90 tablet 3    Sig: TAKE ONE (1) TABLET BY MOUTH EVERY DAY BEFORE BREAKFAST     Endocrinology:  Diabetes - SGLT2 Inhibitors Failed - 02/11/2023  6:15 PM      Failed - Valid encounter within last 6 months    Recent Outpatient Visits           1 year ago Essential hypertension   [facility], Priscille Heidelberg, MD   1 year ago Essential hypertension   [facility] Tanya Nones, Priscille Heidelberg, MD   1 year ago Type 2 diabetes mellitus with other specified complication, without long-term current use of insulin (HCC)   [facility]ard, Priscille Heidelberg, MD   1 year ago Strain of calf muscle, right, initial encounter   [facility], Priscille Heidelberg, MD   1 year ago Type 2 diabetes mellitus with other specified complication, without long-term current use of insulin (HCC)   [facility] Pickard, Priscille Heidelberg, MD              Passed - Cr in normal range and within 360 days    Creat  Date Value Ref Range Status  01/16/2023 1.08 0.70 - 1.30 mg/dL Final   Creatinine, Urine  Date Value Ref Range Status  01/16/2023 81 20 - 320 mg/dL Final         Passed - HBA1C is between 0 and 7.9 and within 180 days    Hgb A1c MFr Bld  Date Value Ref Range Status  01/16/2023 7.7 (H) <5.7 % of total Hgb Final    Comment:    For someone without known diabetes, a hemoglobin A1c value of 6.5% or greater indicates that they may have  diabetes and this should be confirmed with a follow-up  test. . For someone with known diabetes, a value <7% indicates  that their diabetes is well controlled and a value  greater than or equal to 7% indicates suboptimal  control. A1c targets should be individualized based on  duration of diabetes, age, comorbid conditions, and  other considerations. . Currently, no consensus  exists regarding use of hemoglobin A1c for diagnosis of diabetes for children. .          Passed - eGFR in normal range and within 360 days    GFR, Est African American  Date Value Ref Range Status  06/20/2020 80 > OR = 60 mL/min/1.32m2 Final   GFR, Est Non African American  Date Value Ref Range Status  06/20/2020 69 > OR = 60 mL/min/1.40m2 Final   GFR, Estimated  Date Value Ref Range Status  07/13/2021 >60 >60 mL/min Final    Comment:    (NOTE) Calculated using the CKD-EPI Creatinine Equation (2021)    eGFR  Date Value Ref Range Status  01/16/2023 80 > OR = 60 mL/min/1.15m2 Final          amLODipine (NORVASC) 10 MG tablet [Pharmacy Med Name: AMLODIPINE BESYLATE 10 MG TAB] 90 tablet 3    Sig: TAKE ONE (1) TABLET BY MOUTH EVERY DAY THIS REPLACES ATENOLOL     Cardiovascular: Calcium Channel Blockers 2 Failed - 02/11/2023  6:15 PM      Failed - Valid encounter within last 6 months    Recent Outpatient Visits  1 year ago Essential hypertension   [facility] Pickard, Priscille Heidelberg, MD   1 year ago Essential hypertension   [facility] Tanya Nones, Priscille Heidelberg, MD   1 year ago Type 2 diabetes mellitus with other specified complication, without long-term current use of insulin (HCC)   [facility] Pickard, Priscille Heidelberg, MD   1 year ago Strain of calf muscle, right, initial encounter   [facility] Donita Brooks, MD   1 year ago Type 2 diabetes mellitus with other specified complication, without long-term current use of insulin (HCC)   [facility] Pickard, Priscille Heidelberg, MD              Passed - Last BP in normal range    BP Readings from Last 1 Encounters:  02/03/23 120/76         Passed - Last Heart Rate in normal range    Pulse Readings from Last 1 Encounters:  02/03/23 87

## 2023-02-12 NOTE — Therapy (Addendum)
OUTPATIENT PHYSICAL THERAPY CERVICAL TREATMENT   Patient Name: Xavier Daniels MRN: [id number] DOB:[birth year], 57 y.o., male Today's Date: 02/12/2023  END OF SESSION:   02/12/23 0856  PT Visits / Re-Eval  Visit Number 3  Number of Visits 5  Date for PT Re-Evaluation 02/27/23  Authorization  Authorization Type [place] [employer]  Authorization Time Period 6 visits approved 10/18-12/16  Authorization - Visit Number 2  Authorization - Number of Visits 6  Progress Note Due on Visit 5  PT Time Calculation  PT Start Time 0845  PT Stop Time 0925  PT Time Calculation (min) 40 min  PT - End of Session  Activity Tolerance Patient tolerated treatment well  Behavior During Therapy [facility] for tasks assessed/performed     Past Medical History:  Diagnosis Date   Arthritis    Colon cancer screening 10/20/2018   Diabetes mellitus without complication (HCC)    Gout    Gout    Hypertension    Substance abuse (HCC)    ETOH, 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, CLEAN FOR 13 YEARS   Past Surgical History:  Procedure Laterality Date   BIOPSY  04/20/2019   Procedure: BIOPSY;  Surgeon: West Bali, MD;  [facility]: [facility];  Service: Endoscopy;;  transverse polyp x1   COLONOSCOPY WITH PROPOFOL N/A 04/20/2019   Procedure: COLONOSCOPY WITH PROPOFOL;  Surgeon: West Bali, MD;  Location: [facility];  Service: Endoscopy;  Laterality: N/A;  9:30am-office rescheduled 04/20/19 @ 8:30am   POLYPECTOMY  04/20/2019   Procedure: POLYPECTOMY;  Surgeon: West Bali, MD;  Location: [facility];  Service: Endoscopy;;  transversex1, rectal polyp   TOTAL HIP ARTHROPLASTY  2007   due to injury for avascular necrosis.   Patient Active Problem List   Diagnosis Date Noted   Chronic pain of left knee 12/14/2019   Depression, major, single episode, mild (HCC) 12/14/2019   Left ankle pain 12/14/2019   T2DM (type 2 diabetes mellitus) (HCC) 09/10/2019   Essential hypertension 06/06/2014   Hyperlipidemia  06/06/2014   Gout 06/06/2014   Obesity 06/06/2014    PCP: Donita Brooks, MD  REFERRING PROVIDER: London Sheer, MD  REFERRING DIAG: M54.12 (ICD-10-CM) - Radiculopathy, cervical region M54.2 (ICD-10-CM) - Neck pain  THERAPY DIAG:  No diagnosis found.  Rationale for Evaluation and Treatment: Rehabilitation  ONSET DATE: 5-6 months ago  SUBJECTIVE:  SUBJECTIVE STATEMENT: Doing well today but is reporting of some pain = 2/10 on the L shoulder blade.   Evaluation:  Arrives to the clinic with neck pain and pain on the medial side of the L shoulder blade (see below).  Denies any numbness or tingling on the hands, weakness on the grip, HA or dizziness. Condition started around 5-6 ago without reason and gradually got worse. Denies any form of rx in the past other than PT which helped a lot (was just d/c from a different facility 01/07/23). Decides to continue with PT because he thinks he did not get enough PT. Patient has been doing his HEP as he was directed. Patient was in the ED last week due to exacerbation of pain without hx of trauma. Was given steroids and toradol which significantly helped with the pain Recent MD visit referred patient to outpatient PT evaluation and management. Hand dominance: Right  PERTINENT HISTORY:  Arthritis, HTN  PAIN:  Are you having pain? Yes: NPRS scale: 5/10 Pain location: neck pain and pain on the medial side of L shoulder blade Pain description: aching, nagging Aggravating factors: slouching, a lot of twisting/turning, laying on either sides (10/10 pain) Relieving factors: upright posture (2/10 pain)  PRECAUTIONS: None  RED FLAGS: Bowel or bladder incontinence: No, Cauda equina syndrome: No, and Compression fracture: No     WEIGHT BEARING  RESTRICTIONS: No  FALLS:  Has patient fallen in last 6 months? No  LIVING ENVIRONMENT: Lives with: lives with their spouse and lives with their son Lives in: House/apartment Stairs: Yes: External: 1 steps; none Has following equipment at home: None  OCCUPATION: unemployed  PLOF: Independent and Independent with basic ADLs  PATIENT GOALS: Patient wants to be healed  NEXT MD VISIT: when he finishes with PT.  OBJECTIVE:  Note: Objective measures were completed at Evaluation unless otherwise noted.  DIAGNOSTIC FINDINGS:  08/12/22 XR of the cervical spine from 08/12/2022 was previously independently reviewed and interpreted, showing early anterior osteophyte formation seen at C4/5. Disc height loss with anterior osteophyte formation at C5/6. No fracture or dislocation seen. No evidence of instability on flexion/extension views.  PATIENT SURVEYS:  NDI 14/50 = 28%  COGNITION: Overall cognitive status: Within functional limits for tasks assessed  SENSATION: Not tested  POSTURE: rounded shoulders, forward head, and L scapular winging and slight upward rotation  PALPATION: Grade 1 tenderness and muscle spasm on paracervicals and upper trapezius Moderate restriction on pectorals, upper trapezius, lev scapulae, and suboccipitals Hypomobility of the L scapula towards downward rotation   CERVICAL ROM:   Active ROM A/PROM (deg) eval  Flexion 45  Extension 50*  Right lateral flexion 25  Left lateral flexion 20  Right rotation 60  Left rotation 60   (Blank rows = not tested) *Extension causes pain on the L shoulder blade  UPPER EXTREMITY ROM:  Active ROM Right eval Left eval  Shoulder flexion [facility]  Shoulder extension    Shoulder abduction [facility]  Shoulder adduction [facility]  Shoulder internal rotation [facility] WFL  Shoulder external rotation [facility] WFL  Elbow flexion    Elbow extension    Wrist flexion    Wrist extension    Wrist ulnar deviation    Wrist radial  deviation    Wrist pronation    Wrist supination     (Blank rows = not tested)  UPPER EXTREMITY MMT:  MMT Right eval Left eval  Shoulder flexion 5 5  Shoulder extension 5 5  Shoulder  abduction 5 5  Shoulder adduction    Shoulder extension    Shoulder internal rotation 5 4-  Shoulder external rotation 5 4-  Middle trapezius 4- 4-  Lower trapezius 4- 4-  Serratus anterior 5 4-  Elbow flexion    Elbow extension    Wrist flexion    Wrist extension    Wrist ulnar deviation    Wrist radial deviation    Wrist pronation    Wrist supination    Grip strength (grossly assessed) 5 5   (Blank rows = not tested)  CERVICAL SPECIAL TESTS:  Spurling's test: Negative and Distraction test: Positive (relieves symptoms) (-) ULTT ulnar, radial and median on B (-) Lift-off sign on B  TODAY'S TREATMENT:                                                                                                                              DATE:  02/12/23 UBE, level 1, forward, 5' Seated upper trapezius stretch x 30" x 2 Seated lev scap stretch x 30" x 2 Rhomboid stretch x 30" Doorway pec stretch, 90 deg abd x 30" x 3 Scapular mob, with emphasis on downward glide x 2' Supine shoulder alphabets, shoulder 90 deg flex x 2 lbs x 1 set Foam roller series (1 pillow on back), hugs x 20  Forearm walks on wall x 10 x 2, RTB Wall clocks x RTB, 10 on each 10 lb-dumbbell bottoms up carry x 20 ft x 3 rounds  02/04/23 Exercises - Scapular Protraction at Wall  10 reps - Wall Push Up 10 reps - Serratus Activation at Wall with Foam Roll  10 reps - Shoulder External Rotation and Scapular Retraction with GTB 10 reps - Standing Shoulder Row with  GTB 10 reps - Shoulder extension with GTB 10 reps - Alternating Shoulder Flexion Overhead with GTB 10 reps   01/30/23 Evaluation and patient education done   PATIENT EDUCATION:  Education details: Educated on the pathoanatomy of neck pain. Educated on the goals and  course of rehab. Person educated: Patient Education method: Explanation Education comprehension: verbalized understanding  HOME EXERCISE PROGRAM: Evaluation: None provided to date  Access Code: [email or web address] URL: [email or web address] 02/12/23 - Seated Upper Trapezius Stretch  - 2 x daily - 7 x weekly - 3 reps - 30 hold - Seated Levator Scapulae Stretch  - 2 x daily - 7 x weekly - 3 reps - 30 hold - Doorway Pec Stretch at 90 Degrees Abduction  - 2 x daily - 7 x weekly - 3 reps - 30 hold - Wall Clock with Theraband  - 1 x daily - 7 x weekly - 1 sets - 10 reps - Forearm Walks on Wall with Resistance Band  - 1 x daily - 7 x weekly - 2 sets - 10 reps - Thoracic Foam Roll Mobilization Hug  - 1 x daily - 7 x weekly - 1 sets - 20 reps  Date: 02/04/2023  Prepared by: Emeline Gins  Exercises - Scapular Protraction at Wall  - 2 x daily - 7 x weekly - 2 sets - 10 reps - Wall Push Up  - 2 x daily - 7 x weekly - 2 sets - 10 reps - Serratus Activation at Wall with Foam Roll and Resistance Band  - 2 x daily - 7 x weekly - 2 sets - 10 reps - Shoulder External Rotation and Scapular Retraction with Resistance  - 2 x daily - 7 x weekly - 2 sets - 10 reps - Standing Shoulder Row with Anchored Resistance  - 2 x daily - 7 x weekly - 2 sets - 10 reps - Shoulder extension with resistance - Neutral  - 2 x daily - 7 x weekly - 2 sets - 10 reps - Alternating Shoulder Flexion Overhead with Resistance  - 2 x daily - 7 x weekly - 2 sets - 10 reps  ASSESSMENT:  CLINICAL IMPRESSION: Interventions today were geared towards cervical flexibility and shoulder strengthening. Tolerated all activities without worsening of symptoms. Demonstrated appropriate levels of fatigue. Provided slight amount of cueing to ensure correct execution of activity with good carry-over. To date, skilled PT is required to address the impairments and improve function.   OBJECTIVE IMPAIRMENTS: decreased activity tolerance, decreased  ROM, decreased strength, hypomobility, impaired flexibility, postural dysfunction, and pain.   ACTIVITY LIMITATIONS: carrying, lifting, bending, sitting, standing, and sleeping  PARTICIPATION LIMITATIONS: meal prep, cleaning, laundry, and community activity  PERSONAL FACTORS: Time since onset of injury/illness/exacerbation are also affecting patient's functional outcome.   REHAB POTENTIAL: Good  CLINICAL DECISION MAKING: Stable/uncomplicated  EVALUATION COMPLEXITY: Low   GOALS: Goals reviewed with patient? Yes  SHORT TERM GOALS: Target date: 02/13/23  Pt will demonstrate indep in HEP to facilitate carry-over of skilled services and improve functional outcomes  Goal status: INITIAL   LONG TERM GOALS: Target date: 02/27/23  Pt will demonstrate a decrease in NDI score by 19% to demonstrate significant improvement in ADLs Baseline: 28% Goal status: INITIAL  2.  Pt will demonstrate increase in cervical flexion ROM by 20 degrees  to facilitate ease in ADLs Baseline: 45 degrees Goal status: INITIAL  3.  Patient will demonstrate increase in UE strength to 4+/5 to facilitate ease in ADLs  Baseline: 4-/5 Goal status: INITIAL  PLAN:  PT FREQUENCY: 1x/week  PT DURATION: 4 weeks  PLANNED INTERVENTIONS: 97164- PT Re-evaluation, 97110-Therapeutic exercises, 97530- Therapeutic activity, 97112- Neuromuscular re-education, 97535- Self Care, 16109- Manual therapy, 97014- Electrical stimulation (unattended), 97035- Ultrasound, Taping, Dry Needling, Cryotherapy, and Moist heat  PLAN FOR NEXT SESSION: progress postural and cervical strengthening which includes lower trapezius and serratus anterior. Also work on UE and cervical flexibility and mobility.  Tish Frederickson. Rainey Kahrs, PT, DPT, OCS Board-Certified Clinical Specialist in Orthopedic PT [facility] Ph: [phone] 02/12/2023, 8:46 AM

## 2023-02-19 ENCOUNTER — Ambulatory Visit (HOSPITAL_COMMUNITY): Payer: Medicaid Other | Attending: Orthopedic Surgery

## 2023-02-19 DIAGNOSIS — M542 Cervicalgia: Secondary | ICD-10-CM | POA: Insufficient documentation

## 2023-02-19 DIAGNOSIS — R252 Cramp and spasm: Secondary | ICD-10-CM | POA: Insufficient documentation

## 2023-02-19 DIAGNOSIS — R293 Abnormal posture: Secondary | ICD-10-CM | POA: Diagnosis present

## 2023-02-19 NOTE — Therapy (Signed)
OUTPATIENT PHYSICAL THERAPY CERVICAL TREATMENT   Patient Name: Xavier Daniels MRN: [id number] DOB:06/05/1965, 57 y.o., male Today's Date: 02/19/2023  END OF SESSION:  PT End of Session - 02/19/23 0844     Visit Number 4    Number of Visits 5    Date for PT Re-Evaluation 02/27/23    Authorization Type [place] Medicaid [employer]    Authorization Time Period 6 visits approved 10/18-12/16    Authorization - Visit Number 3    Authorization - Number of Visits 6    Progress Note Due on Visit 5    PT Start Time 0845    PT Stop Time 0925    PT Time Calculation (min) 40 min    Activity Tolerance Patient tolerated treatment well    Behavior During Therapy [facility] for tasks assessed/performed               Past Medical History:  Diagnosis Date   Arthritis    Colon cancer screening 10/20/2018   Diabetes mellitus without complication (HCC)    Gout    Gout    Hypertension    Substance abuse (HCC)    ETOH, 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, CLEAN FOR 13 YEARS   Past Surgical History:  Procedure Laterality Date   BIOPSY  04/20/2019   Procedure: BIOPSY;  Surgeon: West Bali, MD;  [facility]: [facility];  Service: Endoscopy;;  transverse polyp x1   COLONOSCOPY WITH PROPOFOL N/A 04/20/2019   Procedure: COLONOSCOPY WITH PROPOFOL;  Surgeon: West Bali, MD;  Location: [facility];  Service: Endoscopy;  Laterality: N/A;  9:30am-office rescheduled 04/20/19 @ 8:30am   POLYPECTOMY  04/20/2019   Procedure: POLYPECTOMY;  Surgeon: West Bali, MD;  Location: [facility];  Service: Endoscopy;;  transversex1, rectal polyp   TOTAL HIP ARTHROPLASTY  2007   due to injury for avascular necrosis.   Patient Active Problem List   Diagnosis Date Noted   Chronic pain of left knee 12/14/2019   Depression, major, single episode, mild (HCC) 12/14/2019   Left ankle pain 12/14/2019   T2DM (type 2 diabetes mellitus) (HCC) 09/10/2019   Essential hypertension 06/06/2014   Hyperlipidemia 06/06/2014   Gout  06/06/2014   Obesity 06/06/2014    PCP: Donita Brooks, MD  REFERRING PROVIDER: London Sheer, MD  REFERRING DIAG: [phone] (ICD-10-CM) - Radiculopathy, cervical region M54.2 (ICD-10-CM) - Neck pain  THERAPY DIAG:  Cervicalgia  Cramp and spasm  Abnormal posture  Rationale for Evaluation and Treatment: Rehabilitation  ONSET DATE: 5-6 months ago  SUBJECTIVE:  SUBJECTIVE STATEMENT: Doing well today and denies pain. However, patient reported that he had bad pain yesterday on the same area (7/10) pain. Patient states that he was sore for a day after the last session. Claims that he has been doing his HEP without any issues.  Evaluation:  Arrives to the clinic with neck pain and pain on the medial side of the L shoulder blade (see below).  Denies any numbness or tingling on the hands, weakness on the grip, HA or dizziness. Condition started around 5-6 ago without reason and gradually got worse. Denies any form of rx in the past other than PT which helped a lot (was just d/c from a different facility 01/07/23). Decides to continue with PT because he thinks he did not get enough PT. Patient has been doing his HEP as he was directed. Patient was in the ED last week due to exacerbation of pain without hx of trauma. Was given steroids and toradol which significantly helped with the pain Recent MD visit referred patient to outpatient PT evaluation and management. Hand dominance: Right  PERTINENT HISTORY:  Arthritis, HTN  PAIN:  Are you having pain? Yes: NPRS scale: 5/10 Pain location: neck pain and pain on the medial side of L shoulder blade Pain description: aching, nagging Aggravating factors: slouching, a lot of twisting/turning, laying on either sides (10/10 pain) Relieving factors:  upright posture (2/10 pain)  PRECAUTIONS: None  RED FLAGS: Bowel or bladder incontinence: No, Cauda equina syndrome: No, and Compression fracture: No     WEIGHT BEARING RESTRICTIONS: No  FALLS:  Has patient fallen in last 6 months? No  LIVING ENVIRONMENT: Lives with: lives with their spouse and lives with their son Lives in: House/apartment Stairs: Yes: External: 1 steps; none Has following equipment at home: None  OCCUPATION: unemployed  PLOF: Independent and Independent with basic ADLs  PATIENT GOALS: Patient wants to be healed  NEXT MD VISIT: when he finishes with PT.  OBJECTIVE:  Note: Objective measures were completed at Evaluation unless otherwise noted.  DIAGNOSTIC FINDINGS:  08/12/22 XR of the cervical spine from 08/12/2022 was previously independently reviewed and interpreted, showing early anterior osteophyte formation seen at C4/5. Disc height loss with anterior osteophyte formation at C5/6. No fracture or dislocation seen. No evidence of instability on flexion/extension views.  PATIENT SURVEYS:  NDI 14/50 = 28%  COGNITION: Overall cognitive status: Within functional limits for tasks assessed  SENSATION: Not tested  POSTURE: rounded shoulders, forward head, and L scapular winging and slight upward rotation  PALPATION: Grade 1 tenderness and muscle spasm on paracervicals and upper trapezius Moderate restriction on pectorals, upper trapezius, lev scapulae, and suboccipitals Hypomobility of the L scapula towards downward rotation   CERVICAL ROM:   Active ROM A/PROM (deg) eval  Flexion 45  Extension 50*  Right lateral flexion 25  Left lateral flexion 20  Right rotation 60  Left rotation 60   (Blank rows = not tested) *Extension causes pain on the L shoulder blade  UPPER EXTREMITY ROM:  Active ROM Right eval Left eval  Shoulder flexion [facility]  Shoulder extension    Shoulder abduction [facility]  Shoulder adduction [facility]  Shoulder internal  rotation [facility]  Shoulder external rotation [facility] WFL  Elbow flexion    Elbow extension    Wrist flexion    Wrist extension    Wrist ulnar deviation    Wrist radial deviation    Wrist pronation    Wrist supination     (  Blank rows = not tested)  UPPER EXTREMITY MMT:  MMT Right eval Left eval  Shoulder flexion 5 5  Shoulder extension 5 5  Shoulder abduction 5 5  Shoulder adduction    Shoulder extension    Shoulder internal rotation 5 4-  Shoulder external rotation 5 4-  Middle trapezius 4- 4-  Lower trapezius 4- 4-  Serratus anterior 5 4-  Elbow flexion    Elbow extension    Wrist flexion    Wrist extension    Wrist ulnar deviation    Wrist radial deviation    Wrist pronation    Wrist supination    Grip strength (grossly assessed) 5 5   (Blank rows = not tested)  CERVICAL SPECIAL TESTS:  Spurling's test: Negative and Distraction test: Positive (relieves symptoms) (-) ULTT ulnar, radial and median on B (-) Lift-off sign on B  TODAY'S TREATMENT:                                                                                                                              DATE:  02/19/23 UBE, level 2, 2.5' forward, 2.5' backward Seated upper trapezius stretch x 30" x 3 Seated lev scap stretch x 30" x 3 Rhomboids stretch x 30" x 2 Scapular mob, with emphasis on downward glide x 2' Supine chin tucks on a pillow x 3" x 10 Supine cervical rotation on a pillow x 3" x 10 on each Supine shoulder alphabets, shoulder 90 deg flex x 3 lbs x 1 set STM on L rhomboids and upper trapezius x 8' Foam roller series (1 pillow on back), hugs x 20  Forearm walks on wall x 10 x 2, GTB Wall clocks x GTB, 5 on each 8 lb-dumbbell bottoms up carry x 20 ft x 3 rounds  02/12/23 UBE, level 1, forward, 5' Seated upper trapezius stretch x 30" x 2 Seated lev scap stretch x 30" x 2 Rhomboid stretch x 30" Doorway pec stretch, 90 deg abd x 30" x 3 Scapular mob, with emphasis on downward glide x  2' Supine shoulder alphabets, shoulder 90 deg flex x 2 lbs x 1 set Foam roller series (1 pillow on back), hugs x 20  Forearm walks on wall x 10 x 2, RTB Wall clocks x RTB, 10 on each 10 lb-dumbbell bottoms up carry x 20 ft x 3 rounds  02/04/23 Exercises - Scapular Protraction at Wall  10 reps - Wall Push Up 10 reps - Serratus Activation at Wall with Foam Roll  10 reps - Shoulder External Rotation and Scapular Retraction with GTB 10 reps - Standing Shoulder Row with  GTB 10 reps - Shoulder extension with GTB 10 reps - Alternating Shoulder Flexion Overhead with GTB 10 reps   01/30/23 Evaluation and patient education done   PATIENT EDUCATION:  Education details: Educated on the pathoanatomy of neck pain. Educated on the goals and course of rehab. Person educated: Patient Education method: Explanation Education  comprehension: verbalized understanding  HOME EXERCISE PROGRAM: Evaluation: None provided to date  Access Code: [email or web address] URL: [email or web address] 02/19/23 - Supine Cervical Retraction with Towel  - 1-2 x daily - 7 x weekly - 2 sets - 10 reps - 3 hold - Supine Cervical Rotation AROM on Pillow  - 1-2 x daily - 7 x weekly - 1 sets - 10 reps - 3 hold  02/12/23 - Seated Upper Trapezius Stretch  - 2 x daily - 7 x weekly - 3 reps - 30 hold - Seated Levator Scapulae Stretch  - 2 x daily - 7 x weekly - 3 reps - 30 hold - Doorway Pec Stretch at 90 Degrees Abduction  - 2 x daily - 7 x weekly - 3 reps - 30 hold - Wall Clock with Theraband  - 1 x daily - 7 x weekly - 1 sets - 10 reps - Forearm Walks on Wall with Resistance Band  - 1 x daily - 7 x weekly - 2 sets - 10 reps - Thoracic Foam Roll Mobilization Hug  - 1 x daily - 7 x weekly - 1 sets - 20 reps  Date: 02/04/2023 Prepared by: Emeline Gins  Exercises - Scapular Protraction at Wall  - 2 x daily - 7 x weekly - 2 sets - 10 reps - Wall Push Up  - 2 x daily - 7 x weekly - 2 sets - 10 reps - Serratus Activation  at Wall with Foam Roll and Resistance Band  - 2 x daily - 7 x weekly - 2 sets - 10 reps - Shoulder External Rotation and Scapular Retraction with Resistance  - 2 x daily - 7 x weekly - 2 sets - 10 reps - Standing Shoulder Row with Anchored Resistance  - 2 x daily - 7 x weekly - 2 sets - 10 reps - Shoulder extension with resistance - Neutral  - 2 x daily - 7 x weekly - 2 sets - 10 reps - Alternating Shoulder Flexion Overhead with Resistance  - 2 x daily - 7 x weekly - 2 sets - 10 reps  ASSESSMENT:  CLINICAL IMPRESSION: Interventions today were geared towards cervical flexibility and shoulder strengthening. Tolerated all activities without worsening of symptoms except when doing wall walks and foam roller hugs where patient reported of pain = 3-4/10. With this, bottoms carry-up weight was reduced. Rhomboids stretch and STM partially relieved the pain to 1/10. Patient left the clinic with pain = 1/10. Demonstrated appropriate levels of fatigue. Provided slight amount of cueing to ensure correct execution of activity with good carry-over. To date, skilled PT is required to address the impairments and improve function.   OBJECTIVE IMPAIRMENTS: decreased activity tolerance, decreased ROM, decreased strength, hypomobility, impaired flexibility, postural dysfunction, and pain.   ACTIVITY LIMITATIONS: carrying, lifting, bending, sitting, standing, and sleeping  PARTICIPATION LIMITATIONS: meal prep, cleaning, laundry, and community activity  PERSONAL FACTORS: Time since onset of injury/illness/exacerbation are also affecting patient's functional outcome.   REHAB POTENTIAL: Good  CLINICAL DECISION MAKING: Stable/uncomplicated  EVALUATION COMPLEXITY: Low   GOALS: Goals reviewed with patient? Yes  SHORT TERM GOALS: Target date: 02/13/23  Pt will demonstrate indep in HEP to facilitate carry-over of skilled services and improve functional outcomes  Goal status: INITIAL   LONG TERM GOALS: Target  date: 02/27/23  Pt will demonstrate a decrease in NDI score by 19% to demonstrate significant improvement in ADLs Baseline: 28% Goal status: INITIAL  2.  Pt will demonstrate increase in cervical  flexion ROM by 20 degrees  to facilitate ease in ADLs Baseline: 45 degrees Goal status: INITIAL  3.  Patient will demonstrate increase in UE strength to 4+/5 to facilitate ease in ADLs  Baseline: 4-/5 Goal status: INITIAL  PLAN:  PT FREQUENCY: 1x/week  PT DURATION: 4 weeks  PLANNED INTERVENTIONS: 97164- PT Re-evaluation, 97110-Therapeutic exercises, 97530- Therapeutic activity, 97112- Neuromuscular re-education, 97535- Self Care, [id number]- Manual therapy, 97014- Electrical stimulation (unattended), 97035- Ultrasound, Taping, Dry Needling, Cryotherapy, and Moist heat  PLAN FOR NEXT SESSION: progress postural and cervical strengthening which includes lower trapezius and serratus anterior. Also work on UE and cervical flexibility and mobility.  Tish Frederickson. Amariz Flamenco, PT, DPT, OCS Board-Certified Clinical Specialist in Orthopedic PT [facility] Ph: [phone] 02/19/2023, 8:44 AM

## 2023-02-26 ENCOUNTER — Ambulatory Visit (HOSPITAL_COMMUNITY): Payer: Medicaid Other

## 2023-02-26 DIAGNOSIS — M542 Cervicalgia: Secondary | ICD-10-CM | POA: Diagnosis not present

## 2023-02-26 DIAGNOSIS — R293 Abnormal posture: Secondary | ICD-10-CM

## 2023-02-26 NOTE — Therapy (Signed)
OUTPATIENT PHYSICAL THERAPY CERVICAL PROGRESS/DISCHARGE NOTE   Patient Name: Xavier Daniels MRN: [id number] DOB:[birth year], 57 y.o., male Today's Date: 02/26/2023  END OF SESSION:  PT End of Session - 02/26/23 0957     Visit Number 5    Number of Visits 5    Date for PT Re-Evaluation 02/27/23    Authorization Type [place] [employer]    Authorization Time Period 6 visits approved 10/18-12/16    Authorization - Visit Number 4    Authorization - Number of Visits 6    Progress Note Due on Visit 5    PT Start Time 0805    PT Stop Time 0845    PT Time Calculation (min) 40 min    Activity Tolerance Patient tolerated treatment well    Behavior During Therapy [facility] for tasks assessed/performed            Past Medical History:  Diagnosis Date   Arthritis    Colon cancer screening 10/20/2018   Diabetes mellitus without complication (HCC)    Gout    Gout    Hypertension    Substance abuse (HCC)    ETOH, 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, CLEAN FOR 13 YEARS   Past Surgical History:  Procedure Laterality Date   BIOPSY  04/20/2019   Procedure: BIOPSY;  Surgeon: West Bali, MD;  [facility];  Service: Endoscopy;;  transverse polyp x1   COLONOSCOPY WITH PROPOFOL N/A 04/20/2019   Procedure: COLONOSCOPY WITH PROPOFOL;  Surgeon: West Bali, MD;  [facility];  Service: Endoscopy;  Laterality: N/A;  9:30am-office rescheduled 04/20/19 @ 8:30am   POLYPECTOMY  04/20/2019   Procedure: POLYPECTOMY;  Surgeon: West Bali, MD;  [facility];  Service: Endoscopy;;  transversex1, rectal polyp   TOTAL HIP ARTHROPLASTY  2007   due to injury for avascular necrosis.   Patient Active Problem List   Diagnosis Date Noted   Chronic pain of left knee 12/14/2019   Depression, major, single episode, mild (HCC) 12/14/2019   Left ankle pain 12/14/2019   T2DM (type 2 diabetes mellitus) (HCC) 09/10/2019   Essential hypertension 06/06/2014   Hyperlipidemia 06/06/2014   Gout  06/06/2014   Obesity 06/06/2014   Progress Note Reporting Period 01/30/23 to 02/26/23  See note below for Objective Data and Assessment of Progress/Goals.     PCP: Donita Brooks, MD  REFERRING PROVIDER: London Sheer, MD  REFERRING DIAG: [phone] (ICD-10-CM) - Radiculopathy, cervical region M54.2 (ICD-10-CM) - Neck pain  THERAPY DIAG:  Cervicalgia  Abnormal posture  Rationale for Evaluation and Treatment: Rehabilitation  ONSET DATE: 5-6 months ago  SUBJECTIVE:  SUBJECTIVE STATEMENT: PROGRESS NOTE 02/26/23: Doing well today and denies pain. Patient was sore for 2 days after the last session. Was able to do his HEP everyday without any issues. Patient had an episode of pain this week which is mild (3/10 pain). Did his HEP which helped relieve the pain. Patient is around 40% better and thinks that PT has helped him. Patient wishes to continue with PT to work more on the shoulder, back, and neck.  Evaluation:  Arrives to the clinic with neck pain and pain on the medial side of the L shoulder blade (see below).  Denies any numbness or tingling on the hands, weakness on the grip, HA or dizziness. Condition started around 5-6 ago without reason and gradually got worse. Denies any form of rx in the past other than PT which helped a lot (was just d/c from a different facility 01/07/23). Decides to continue with PT because he thinks he did not get enough PT. Patient has been doing his HEP as he was directed. Patient was in the ED last week due to exacerbation of pain without hx of trauma. Was given steroids and toradol which significantly helped with the pain Recent MD visit referred patient to outpatient PT evaluation and management. Hand dominance: Right  PERTINENT HISTORY:  Arthritis,  HTN  PAIN:  Are you having pain? Yes: NPRS scale: 5/10 Pain location: neck pain and pain on the medial side of L shoulder blade Pain description: aching, nagging Aggravating factors: slouching, a lot of twisting/turning, laying on either sides (10/10 pain) Relieving factors: upright posture (2/10 pain)  PRECAUTIONS: None  RED FLAGS: Bowel or bladder incontinence: No, Cauda equina syndrome: No, and Compression fracture: No     WEIGHT BEARING RESTRICTIONS: No  FALLS:  Has patient fallen in last 6 months? No  LIVING ENVIRONMENT: Lives with: lives with their spouse and lives with their son Lives in: House/apartment Stairs: Yes: External: 1 steps; none Has following equipment at home: None  OCCUPATION: unemployed  PLOF: Independent and Independent with basic ADLs  PATIENT GOALS: Patient wants to be healed  NEXT MD VISIT: when he finishes with PT.  OBJECTIVE:  Note: Objective measures were completed at Evaluation unless otherwise noted.  DIAGNOSTIC FINDINGS:  08/12/22 XR of the cervical spine from 08/12/2022 was previously independently reviewed and interpreted, showing early anterior osteophyte formation seen at C4/5. Disc height loss with anterior osteophyte formation at C5/6. No fracture or dislocation seen. No evidence of instability on flexion/extension views.  PATIENT SURVEYS:  NDI 02/26/23: 7/50 = 14% from 14/50 = 28%  COGNITION: Overall cognitive status: Within functional limits for tasks assessed  SENSATION: Not tested  POSTURE: rounded shoulders, forward head, and 02/26/23: absence of scapular winging and slight upward rotation on the L scapula  PALPATION:  02/26/23 No tenderness on paracervicals and upper trapezius Slight muscle spasm on paracervicals and upper trapezius Mild restriction on pectorals, upper trapezius, lev scapulae, and suboccipitals Normal mobility on L scapula   CERVICAL ROM:   Active ROM A/PROM (deg) eval 02/26/23  Flexion 45 55   Extension 50* 55*  Right lateral flexion 25 25  Left lateral flexion 20 25  Right rotation 60 60  Left rotation 60 60   (Blank rows = not tested) *02/26/23: Extension causes stretch on the middle of the upper back from pain  UPPER EXTREMITY ROM:  Active ROM Right eval Left eval  Shoulder flexion [facility]  Shoulder extension    Shoulder abduction [facility]  Shoulder  adduction [facility]  Shoulder internal rotation [facility]  Shoulder external rotation [facility]  Elbow flexion    Elbow extension    Wrist flexion    Wrist extension    Wrist ulnar deviation    Wrist radial deviation    Wrist pronation    Wrist supination     (Blank rows = not tested)  UPPER EXTREMITY MMT:  MMT Right eval Left eval Right 02/26/23 Left 02/26/23  Shoulder flexion 5 5 5 5   Shoulder extension 5 5 5 5   Shoulder abduction 5 5 5 5   Shoulder adduction      Shoulder extension      Shoulder internal rotation 5 4- 5 5  Shoulder external rotation 5 4- 5 5  Middle trapezius 4- 4- 5 5  Lower trapezius 4- 4- 5 5  Serratus anterior 5 4- 5 5  Elbow flexion      Elbow extension      Wrist flexion      Wrist extension      Wrist ulnar deviation      Wrist radial deviation      Wrist pronation      Wrist supination      Grip strength (grossly assessed) 5 5 5 5    (Blank rows = not tested)  CERVICAL SPECIAL TESTS:  Spurling's test: Negative and Distraction test: Positive (relieves symptoms) (-) ULTT ulnar, radial and median on B (-) Lift-off sign on B  TODAY'S TREATMENT:                                                                                                                              DATE:  02/26/23 Progress note (NDI, ROM, MMT, muscle length, subjective information) Review of HEP T exercises with GTB x 10 x 2  02/19/23 UBE, level 2, 2.5' forward, 2.5' backward Seated upper trapezius stretch x 30" x 3 Seated lev scap stretch x 30" x 3 Rhomboids stretch x 30" x 2 Scapular mob, with  emphasis on downward glide x 2' Supine chin tucks on a pillow x 3" x 10 Supine cervical rotation on a pillow x 3" x 10 on each Supine shoulder alphabets, shoulder 90 deg flex x 3 lbs x 1 set STM on L rhomboids and upper trapezius x 8' Foam roller series (1 pillow on back), hugs x 20  Forearm walks on wall x 10 x 2, GTB Wall clocks x GTB, 5 on each 8 lb-dumbbell bottoms up carry x 20 ft x 3 rounds  02/12/23 UBE, level 1, forward, 5' Seated upper trapezius stretch x 30" x 2 Seated lev scap stretch x 30" x 2 Rhomboid stretch x 30" Doorway pec stretch, 90 deg abd x 30" x 3 Scapular mob, with emphasis on downward glide x 2' Supine shoulder alphabets, shoulder 90 deg flex x 2 lbs x 1 set Foam roller series (1 pillow on back), hugs x 20  Forearm walks on wall x  10 x 2, RTB Wall clocks x RTB, 10 on each 10 lb-dumbbell bottoms up carry x 20 ft x 3 rounds  02/04/23 Exercises - Scapular Protraction at Wall  10 reps - Wall Push Up 10 reps - Serratus Activation at Wall with Foam Roll  10 reps - Shoulder External Rotation and Scapular Retraction with GTB 10 reps - Standing Shoulder Row with  GTB 10 reps - Shoulder extension with GTB 10 reps - Alternating Shoulder Flexion Overhead with GTB 10 reps   01/30/23 Evaluation and patient education done   PATIENT EDUCATION:  Education details: Educated on the pathoanatomy of neck pain. Educated on the goals and course of rehab. Person educated: Patient Education method: Explanation Education comprehension: verbalized understanding  HOME EXERCISE PROGRAM: Evaluation: None provided to date  Access Code: [email or web address] URL: [email or web address] 02/25/23 - Standing Shoulder Horizontal Abduction with Anchored Resistance  - 2 x daily - 7 x weekly - 2 sets - 10 reps  02/19/23 - Supine Cervical Retraction with Towel  - 1-2 x daily - 7 x weekly - 2 sets - 10 reps - 3 hold - Supine Cervical Rotation AROM on Pillow  - 1-2 x daily - 7 x  weekly - 1 sets - 10 reps - 3 hold  02/12/23 - Seated Upper Trapezius Stretch  - 2 x daily - 7 x weekly - 3 reps - 30 hold - Seated Levator Scapulae Stretch  - 2 x daily - 7 x weekly - 3 reps - 30 hold - Doorway Pec Stretch at 90 Degrees Abduction  - 2 x daily - 7 x weekly - 3 reps - 30 hold - Wall Clock with Theraband  - 1 x daily - 7 x weekly - 1 sets - 10 reps - Forearm Walks on Wall with Resistance Band  - 1 x daily - 7 x weekly - 2 sets - 10 reps - Thoracic Foam Roll Mobilization Hug  - 1 x daily - 7 x weekly - 1 sets - 20 reps  Date: 02/04/2023 Prepared by: Emeline Gins  Exercises - Scapular Protraction at Wall  - 2 x daily - 7 x weekly - 2 sets - 10 reps - Wall Push Up  - 2 x daily - 7 x weekly - 2 sets - 10 reps - Serratus Activation at Wall with Foam Roll and Resistance Band  - 2 x daily - 7 x weekly - 2 sets - 10 reps - Shoulder External Rotation and Scapular Retraction with Resistance  - 2 x daily - 7 x weekly - 2 sets - 10 reps - Standing Shoulder Row with Anchored Resistance  - 2 x daily - 7 x weekly - 2 sets - 10 reps - Shoulder extension with resistance - Neutral  - 2 x daily - 7 x weekly - 2 sets - 10 reps - Alternating Shoulder Flexion Overhead with Resistance  - 2 x daily - 7 x weekly - 2 sets - 10 reps  ASSESSMENT:  CLINICAL IMPRESSION: Patient demonstrated continued improvements as indicated by positive significant changes in strength and ROM. Patient's NDI score has improved as well and is close to depicting significant change in function. In addition, patient does not report with pain at present. Episodes of pain are less frequent and are only mild. Whenever there is an episode of pain, pain is relieved by doing his HEP. With this, skilled PT is not required at this time and patient can just continue with his HEP to facilitate carry-over  of the gains in PT. Patient also presents with mild spasm on the upper back and patient may benefit from massage therapy to help with  the spasm. Interventions today were geared towards re-enforcing middle trapezius strength to be added on his HEP. Tolerated all activities without worsening of symptoms. Demonstrated appropriate levels of fatigue. Provided little to no amount of cueing to ensure correct execution of activity with good carry-over.   OBJECTIVE IMPAIRMENTS: increased muscle spasms and impaired flexibility.   ACTIVITY LIMITATIONS: lifting  PARTICIPATION LIMITATIONS: meal prep, cleaning, laundry, and community activity  PERSONAL FACTORS: Time since onset of injury/illness/exacerbation are also affecting patient's functional outcome.   REHAB POTENTIAL: Good    GOALS: Goals reviewed with patient? Yes  SHORT TERM GOALS: Target date: 02/13/23  Pt will demonstrate indep in HEP to facilitate carry-over of skilled services and improve functional outcomes  Goal status: INITIAL   LONG TERM GOALS: Target date: 02/27/23  Pt will demonstrate a decrease in NDI score by 19% to demonstrate significant improvement in ADLs Baseline: 28% Goal status: PARTIALLY MET  2.  Pt will demonstrate increase in cervical flexion ROM by 20 degrees  to facilitate ease in ADLs Baseline: 45 degrees Goal status: PARTIALLY MET  3.  Patient will demonstrate increase in UE strength to 4+/5 to facilitate ease in ADLs  Baseline: 4-/5 Goal status: MET  PLAN:  PT FREQUENCY:  0  PT DURATION: other: 0  PLANNED INTERVENTIONS:  D/C from skilled PT. D/C to independent HEP.  PHYSICAL THERAPY DISCHARGE SUMMARY  Visits from Start of Care: 5  Current functional level related to goals / functional outcomes: See above   Remaining deficits: See above   Education / Equipment: See above   Patient agrees to discharge. Patient goals were partially met. Patient is being discharged due to being pleased with the current functional level.   Tish Frederickson. Maylie Ashton, PT, DPT, OCS Board-Certified Clinical Specialist in Orthopedic PT [facility] Ph: [phone] 02/26/2023, 10:01 AM

## 2023-03-17 ENCOUNTER — Other Ambulatory Visit: Payer: Self-pay | Admitting: Family Medicine

## 2023-03-17 DIAGNOSIS — E785 Hyperlipidemia, unspecified: Secondary | ICD-10-CM

## 2023-03-20 NOTE — Telephone Encounter (Signed)
Requested Prescriptions  Pending Prescriptions Disp Refills   pravastatin (PRAVACHOL) 40 MG tablet [Pharmacy Med Name: PRAVASTATIN SODIUM 40 MG TAB] 90 tablet 1    Sig: TAKE ONE (1) TABLET BY MOUTH EVERY DAY     Cardiovascular:  Antilipid - Statins Failed - 03/17/2023 12:35 PM      Failed - Valid encounter within last 12 months    Recent Outpatient Visits           1 year ago Essential hypertension   [facility] Pickard, Priscille Heidelberg, MD   1 year ago Essential hypertension   [facility] Donita Brooks, MD   1 year ago Type 2 diabetes mellitus with other specified complication, without long-term current use of insulin (HCC)   [facility] Pickard, Priscille Heidelberg, MD   2 years ago Strain of calf muscle, right, initial encounter   [facility] Donita Brooks, MD   2 years ago Type 2 diabetes mellitus with other specified complication, without long-term current use of insulin (HCC)   [facility] Pickard, Priscille Heidelberg, MD              Failed - Lipid Panel in normal range within the last 12 months    Cholesterol  Date Value Ref Range Status  01/16/2023 166 <200 mg/dL Final   LDL Cholesterol (Calc)  Date Value Ref Range Status  01/16/2023 96 mg/dL (calc) Final    Comment:    Reference range: <100 . Desirable range <100 mg/dL for primary prevention;   <70 mg/dL for patients with CHD or diabetic patients  with > or = 2 CHD risk factors. Marland Kitchen LDL-C is now calculated using the Martin-Hopkins  calculation, which is a validated novel method providing  better accuracy than the Friedewald equation in the  estimation of LDL-C.  Horald Pollen et al. Lenox Ahr. 1610;960(45): 2061-2068  (http://education.[facility].com/faq/FAQ164)    HDL  Date Value Ref Range Status  01/16/2023 43 > OR = 40 mg/dL Final   Triglycerides  Date Value Ref Range Status  01/16/2023 178 (H) <150 mg/dL Final         Passed - Patient  is not pregnant

## 2023-04-18 ENCOUNTER — Other Ambulatory Visit: Payer: Self-pay | Admitting: Family Medicine

## 2023-06-05 ENCOUNTER — Ambulatory Visit: Payer: Medicaid Other | Admitting: Family Medicine

## 2023-06-06 ENCOUNTER — Encounter: Payer: Self-pay | Admitting: Family Medicine

## 2023-06-06 ENCOUNTER — Ambulatory Visit: Payer: Medicaid Other | Admitting: Family Medicine

## 2023-06-06 VITALS — BP 120/64 | HR 88 | Temp 98.2°F | Ht 73.0 in | Wt 244.8 lb

## 2023-06-06 DIAGNOSIS — M545 Low back pain, unspecified: Secondary | ICD-10-CM

## 2023-06-06 MED ORDER — HYDROCODONE-ACETAMINOPHEN 7.5-325 MG PO TABS: 1.0000 | ORAL_TABLET | Freq: Four times a day (QID) | ORAL | 0 refills | Status: DC | PRN

## 2023-06-06 NOTE — Progress Notes (Signed)
 Subjective:    Patient ID: Xavier Daniels, male    DOB: 08/02/1965, 58 y.o.   MRN: [id number]  Shoulder Pain   Back Pain  Patient has been diagnosed with cervical radiculopathy.  Please see my previous office visits.  He has been dealing with pain radiating from his neck into his left shoulder and left arm for more than a year.  A CT scan of the cervical spine last year showed degenerative disc disease at C5-C6.  He has been treated with steroid taper packs in the past which have helped substantially.  When I last saw the patient in March, we decided not to pursue an MRI because his pain was relatively well-controlled.  However recently had to go to the emergency room again due to exacerbation of the pain.  He is seeing an orthopedist who has ordered an MRI of his neck on Saturday.  He is trying Lyrica 75 mg twice daily which has helped minimally  06/06/23 Patient continues to have occasional pain radiating from his neck into his shoulder.  He would like a refill on his pain medication that he would use sparingly.  His pain has improved dramatically however he still has pain on occasion.  We discussed this and I will be willing to give him 30 pills with the intention that the medication would last 3 to 6 months if not longer.  Patient states that this is reasonable and would appreciate that.  Over the last month he has developed pain in his lower back pain located around the left L4-L5.  He describes it as a dorsal bandlike pain.  There is no exacerbating or alleviating factors.  The pain will go and come without reason.  It is a dull aching pain.  He denies any lumbar radiculopathy.  He denies any dysuria or hematuria or urgency or frequency.  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 Past Medical History:  Diagnosis Date   Arthritis    Colon cancer screening 10/20/2018   Diabetes mellitus without complication (HCC)    Gout    Gout    Hypertension    Substance abuse (HCC)    ETOH,  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, CLEAN FOR 13 YEARS   Past Surgical History:  Procedure Laterality Date   BIOPSY  04/20/2019   Procedure: BIOPSY;  Surgeon: West Bali, MD;  [facility];  Service: Endoscopy;;  transverse polyp x1   COLONOSCOPY WITH PROPOFOL N/A 04/20/2019   Procedure: COLONOSCOPY WITH PROPOFOL;  Surgeon: West Bali, MD;  [facility];  Service: Endoscopy;  Laterality: N/A;  9:30am-office rescheduled 04/20/19 @ 8:30am   POLYPECTOMY  04/20/2019   Procedure: POLYPECTOMY;  Surgeon: West Bali, MD;  [facility];  Service: Endoscopy;;  transversex1, rectal polyp   TOTAL HIP ARTHROPLASTY  2007   due to injury for avascular necrosis.   Current Outpatient Medications on File Prior to Visit  Medication Sig Dispense Refill   allopurinol (ZYLOPRIM) 300 MG tablet TAKE ONE (1) TABLET BY MOUTH EVERY DAY 90 tablet 0   amLODipine (NORVASC) 10 MG tablet TAKE ONE (1) TABLET BY MOUTH EVERY DAY THIS REPLACES ATENOLOL 90 tablet 3   colchicine 0.6 MG tablet Take 1 tablet (0.6 mg total) by mouth 2 (two) times daily as needed (gout). 30 tablet 1   diclofenac Sodium (VOLTAREN) 1 % GEL APPLY 2 GRAMS FOUR TIMES A DAY 100 g 0   ibuprofen (ADVIL) 800 MG tablet  TAKE ONE TABLET BY MOUTH EVERY EIGHT HOURS AS NEEDED 60 tablet 1   JARDIANCE 25 MG TABS tablet TAKE ONE (1) TABLET BY MOUTH EVERY DAY BEFORE BREAKFAST 90 tablet 3   levocetirizine (XYZAL ALLERGY 24HR) 5 MG tablet Take 1 tablet (5 mg total) by mouth every evening. 30 tablet 5   lidocaine (LIDODERM) 5 % Place 1 patch onto the skin daily. Remove & Discard patch within 12 hours or as directed by MD 30 patch 0   losartan (COZAAR) 100 MG tablet TAKE ONE (1) TABLET BY MOUTH EVERY DAY 90 tablet 3   OZEMPIC, 0.25 OR 0.5 MG/DOSE, 2 MG/3ML SOPN INJECT 0.5MG  INTO THE SKIN ONCE A WEEK 3 mL 1   pravastatin (PRAVACHOL) 40 MG tablet TAKE ONE (1) TABLET BY MOUTH EVERY DAY 90 tablet 1   pregabalin (LYRICA) 100 MG capsule TAKE ONE CAPSULE  (100MG  TOTAL) BY MOUTH THREE TIMES DAILY 90 capsule 3   RESTASIS 0.05 % ophthalmic emulsion      sildenafil (VIAGRA) 100 MG tablet TAKE ONE-HALF TO ONE TABLET (50 TO 100MG TOTAL) BY MOUTH DAILY AS NEEDED FOR ERECTILE DYSFUNCTION 5 tablet 3   No current facility-administered medications on file prior to visit.   Allergies  Allergen Reactions   Metformin And Related     Diarrhea    Social History   Socioeconomic History   Marital status: Married    Spouse name: Not on file   Number of children: Not on file   Years of education: Not on file   Highest education level: Never attended school  Occupational History   Not on file  Tobacco Use   Smoking status: Never   Smokeless tobacco: Never  Vaping Use   Vaping status: Never Used  Substance and Sexual Activity   Alcohol use: No    Comment: previous heavy use, quit 2004   Drug use: No    Comment: previous marijuana use   Sexual activity: Yes    Birth control/protection: None  Other Topics Concern   Not on file  Social History Narrative   Not on file   Social Drivers of Health   Financial Resource Strain: High Risk (06/05/2023)   Overall Financial Resource Strain (CARDIA)    Difficulty of Paying Living Expenses: Very hard  Food Insecurity: Food Insecurity Present (06/05/2023)   Hunger Vital Sign    Worried About Running Out of Food in the Last Year: Sometimes true    Ran Out of Food in the Last Year: Sometimes true  Transportation Needs: No Transportation Needs (06/05/2023)   PRAPARE - Administrator, Civil Service (Medical): No    Lack of Transportation (Non-Medical): No  Physical Activity: Inactive (06/05/2023)   Exercise Vital Sign    Days of Exercise per Week: 0 days    Minutes of Exercise per Session: 60 min  Stress: No Stress Concern Present (06/05/2023)   [employer] of Occupational Health - Occupational Stress Questionnaire    Feeling of Stress : Not at all  Social Connections: Socially Integrated  (06/05/2023)   Social Connection and Isolation Panel [NHANES]    Frequency of Communication with Friends and Family: Twice a week    Frequency of Social Gatherings with Friends and Family: Twice a week    Attends Religious Services: More than 4 times per year    Active Member of [employer] or Organizations: Yes    Attends Banker Meetings: More than 4 times per year    Marital Status:  Married  Intimate Partner Violence: Not on file      Review of Systems  Musculoskeletal:  Positive for back pain.  All other systems reviewed and are negative.      Objective:   Physical Exam Vitals reviewed.  Constitutional:      General: He is not in acute distress.    Appearance: Normal appearance. He is not ill-appearing or toxic-appearing.  Cardiovascular:     Rate and Rhythm: Normal rate and regular rhythm.     Pulses: Normal pulses.     Heart sounds: Normal heart sounds. No murmur heard.    No friction rub. No gallop.  Pulmonary:     Effort: Pulmonary effort is normal. No respiratory distress.     Breath sounds: Normal breath sounds. No stridor. No wheezing, rhonchi or rales.  Musculoskeletal:     Lumbar back: No spasms, tenderness or bony tenderness. Decreased range of motion.       Back:  Neurological:     Mental Status: He is alert.           Assessment & Plan:  Acute midline low back pain without sciatica - Plan: DG Lumbar Spine Complete I agreed to give the patient 30 Norco with the intention that this medication would last 3 to 6 months.  He will take this sparingly and intermittently for his neck and cervical pain.  Obtain an x-ray of the lumbar spine.  If the patient is confirmed to have bilateral pars defect, I would recommend physical therapy

## 2023-06-10 ENCOUNTER — Other Ambulatory Visit: Payer: Self-pay | Admitting: Family Medicine

## 2023-06-10 NOTE — Telephone Encounter (Unsigned)
 Copied from CRM [id number]. Topic: Clinical - Medication Refill >> Jun 10, 2023  9:29 AM Higinio Roger wrote: Most Recent Primary Care Visit:  Provider: Lynnea Ferrier T  Department: [facility]  Visit Type: ACUTE  Date: 06/06/2023  Medication: HYDROcodone-acetaminophen (NORCO) 7.5-325 MG tablet   Has the patient contacted their pharmacy? Yes (Agent: If no, request that the patient contact the pharmacy for the refill. If patient does not wish to contact the pharmacy document the reason why and proceed with request.) (Agent: If yes, when and what did the pharmacy advise?) Pharmacy stated they need Dr. To send prior authorization to insurance company.  Is this the correct pharmacy for this prescription? Yes If no, delete pharmacy and type the correct one.  This is the patient's preferred pharmacy:   [facility] - [place], [place] - [address] [place] [address] Phone: [phone] Fax: [phone]   Has the prescription been filled recently? No  Is the patient out of the medication? Yes  Has the patient been seen for an appointment in the last year OR does the patient have an upcoming appointment? Yes  Can we respond through MyChart? Yes  Agent: Please be advised that Rx refills may take up to 3 business days. We ask that you follow-up with your pharmacy.

## 2023-06-11 NOTE — Telephone Encounter (Signed)
 Requested medication (s) are due for refill today:   Requested medication (s) are on the active medication list: Yes  Last refill:  06/06/23  Future visit scheduled:   Notes to clinic:  Not delegated.    Requested Prescriptions  Pending Prescriptions Disp Refills   HYDROcodone-acetaminophen (NORCO) 7.5-325 MG tablet 30 tablet 0    Sig: Take 1 tablet by mouth every 6 (six) hours as needed for moderate pain (pain score 4-6).     Not Delegated - Analgesics:  Opioid Agonist Combinations Failed - 06/11/2023 10:37 AM      Failed - This refill cannot be delegated      Failed - Urine Drug Screen completed in last 360 days      Failed - Valid encounter within last 3 months    Recent Outpatient Visits           1 year ago Essential hypertension   [facility]ard, Priscille Heidelberg, MD   1 year ago Essential hypertension   [facility] Donita Brooks, MD   1 year ago Type 2 diabetes mellitus with other specified complication, without long-term current use of insulin (HCC)   [facility]   2 years ago Strain of calf muscle, right, initial encounter   [facility] Donita Brooks, MD   2 years ago Type 2 diabetes mellitus with other specified complication, without long-term current use of insulin (HCC)   [facility] Pickard, Priscille Heidelberg, MD       Future Appointments             Tomorrow London Sheer, MD [facility]

## 2023-06-12 ENCOUNTER — Telehealth: Payer: Self-pay

## 2023-06-12 ENCOUNTER — Ambulatory Visit: Payer: Medicaid Other | Admitting: Orthopedic Surgery

## 2023-06-12 DIAGNOSIS — M5412 Radiculopathy, cervical region: Secondary | ICD-10-CM

## 2023-06-12 NOTE — Telephone Encounter (Signed)
 Xavier Daniels (Key: [id number])  Your information has been sent to [facility].

## 2023-06-12 NOTE — Progress Notes (Signed)
 Orthopedic Spine Surgery Office Note   Assessment: Patient is a 58 y.o. male with neck pain that radiates into the bilateral shoulders which has improved significantly with PT     Plan: -Patient has tried Tylenol, Toradol, Robaxin, Lyrica, norco, prednisone, PT -Patient recently did another round of PT and is doing great.  He is having minimal pain unless he is lifting heavy objects.  I told him to keep doing the home exercises.  If pain returns, I think PT has worked well for him and I will probably recommend doing that again -I advised him to avoid lifting more than 15 pounds.  He should stick mostly to body weight exercises or resistance training like bands which she is doing -Patient should return to office on an as needed basis     Patient expressed understanding of the plan and all questions were answered to the patient's satisfaction.    __________________________________________________________________________   History: Patient is a 58 y.o. male who comes in today for follow-up on his cervical spine.  He recently did a round of physical therapy.  He states that he noted significant improvement with that treatment.  He is no longer having any pain in his neck radiating into his shoulders.  He said he will noticed the pain if he lifts anything heavy.  He has not developed any weakness in his upper extremities.  No bowel or bladder incontinence.  No saddle anesthesia.  No trouble with fine motor skills in the hand.  No unsteadiness with gait.   Previous treatments: Tylenol, Toradol, Robaxin, Lyrica, norco, prednisone, PT     Physical Exam:   General: no acute distress, appears stated age Neurologic: alert, answering questions appropriately, following commands Respiratory: unlabored breathing on room air, symmetric chest rise Psychiatric: appropriate affect, normal cadence to speech     MSK (spine):     -Strength exam                                                   Left                   Right Grip strength                5/5                  5/5 Interosseus                  5/5                  5/5 Wrist extension            5/5                  5/5 Wrist flexion                 5/5                  5/5 Elbow flexion                5/5                  5/5 Deltoid                          5/5  5/5   -Sensory exam                           Sensation intact to light touch in C5-T1 nerve distributions of bilateral upper extremities     Imaging: XRs of the cervical spine from 08/12/2022 were previously independently reviewed and interpreted, showing early anterior osteophyte formation seen at C4/5.  Disc height loss with anterior osteophyte formation at C5/6.  No fracture or dislocation seen.  No evidence of instability on flexion/extension views.     Patient name: Xavier Daniels Patient MRN: [id number] Date of visit: 06/12/23

## 2023-06-18 ENCOUNTER — Ambulatory Visit (HOSPITAL_COMMUNITY)
Admission: RE | Admit: 2023-06-18 | Discharge: 2023-06-18 | Disposition: A | Discharge status: Home / Self Care | Source: Ambulatory Visit | Attending: Family Medicine | Admitting: Family Medicine

## 2023-06-18 DIAGNOSIS — M545 Low back pain, unspecified: Secondary | ICD-10-CM | POA: Insufficient documentation

## 2023-07-24 ENCOUNTER — Other Ambulatory Visit: Payer: Self-pay | Admitting: Family Medicine

## 2023-07-25 ENCOUNTER — Emergency Department (HOSPITAL_COMMUNITY)

## 2023-07-25 ENCOUNTER — Encounter (HOSPITAL_COMMUNITY): Payer: Self-pay | Admitting: Emergency Medicine

## 2023-07-25 ENCOUNTER — Emergency Department (HOSPITAL_COMMUNITY)
Admission: EM | Admit: 2023-07-25 | Discharge: 2023-07-25 | Disposition: A | Discharge status: Home / Self Care | Attending: Emergency Medicine | Admitting: Emergency Medicine

## 2023-07-25 ENCOUNTER — Other Ambulatory Visit: Payer: Self-pay

## 2023-07-25 DIAGNOSIS — E119 Type 2 diabetes mellitus without complications: Secondary | ICD-10-CM | POA: Insufficient documentation

## 2023-07-25 DIAGNOSIS — R197 Diarrhea, unspecified: Secondary | ICD-10-CM | POA: Insufficient documentation

## 2023-07-25 DIAGNOSIS — Z7984 Long term (current) use of oral hypoglycemic drugs: Secondary | ICD-10-CM | POA: Insufficient documentation

## 2023-07-25 DIAGNOSIS — E878 Other disorders of electrolyte and fluid balance, not elsewhere classified: Secondary | ICD-10-CM | POA: Insufficient documentation

## 2023-07-25 DIAGNOSIS — R1010 Upper abdominal pain, unspecified: Secondary | ICD-10-CM

## 2023-07-25 DIAGNOSIS — I10 Essential (primary) hypertension: Secondary | ICD-10-CM | POA: Diagnosis not present

## 2023-07-25 DIAGNOSIS — R000 Tachycardia, unspecified: Secondary | ICD-10-CM | POA: Diagnosis not present

## 2023-07-25 DIAGNOSIS — R1013 Epigastric pain: Secondary | ICD-10-CM | POA: Diagnosis present

## 2023-07-25 DIAGNOSIS — Z79899 Other long term (current) drug therapy: Secondary | ICD-10-CM | POA: Insufficient documentation

## 2023-07-25 DIAGNOSIS — Z794 Long term (current) use of insulin: Secondary | ICD-10-CM | POA: Diagnosis not present

## 2023-07-25 DIAGNOSIS — K8020 Calculus of gallbladder without cholecystitis without obstruction: Secondary | ICD-10-CM | POA: Diagnosis not present

## 2023-07-25 LAB — COMPREHENSIVE METABOLIC PANEL WITH GFR
ALT: 29 U/L (ref 0–44)
AST: 21 U/L (ref 15–41)
Albumin: 3.9 g/dL (ref 3.5–5.0)
Alkaline Phosphatase: 110 U/L (ref 38–126)
Anion gap: 11 (ref 5–15)
BUN: 10 mg/dL (ref 6–20)
CO2: 19 mmol/L — ABNORMAL LOW (ref 22–32)
Calcium: 9.4 mg/dL (ref 8.9–10.3)
Chloride: 106 mmol/L (ref 98–111)
Creatinine, Ser: 1.14 mg/dL (ref 0.61–1.24)
GFR, Estimated: >60 mL/min (ref >60)
Glucose, Bld: 140 mg/dL — ABNORMAL HIGH (ref 70–99)
Potassium: 3.4 mmol/L — ABNORMAL LOW (ref 3.5–5.1)
Sodium: 136 mmol/L (ref 135–145)
Total Bilirubin: 0.9 mg/dL (ref 0.0–1.2)
Total Protein: 7.8 g/dL (ref 6.5–8.1)

## 2023-07-25 LAB — URINALYSIS, ROUTINE W REFLEX MICROSCOPIC
Bacteria, UA: NONE SEEN
Bilirubin Urine: NEGATIVE
Glucose, UA: >=500 mg/dL — AB
Hgb urine dipstick: NEGATIVE
Ketones, ur: NEGATIVE mg/dL
Leukocytes,Ua: NEGATIVE
Nitrite: NEGATIVE
Protein, ur: NEGATIVE mg/dL
Specific Gravity, Urine: >1.046 — ABNORMAL HIGH (ref 1.005–1.030)
pH: 6 (ref 5.0–8.0)

## 2023-07-25 LAB — CBC
HCT: 45.9 % (ref 39.0–52.0)
Hemoglobin: 14.9 g/dL (ref 13.0–17.0)
MCH: 28.7 pg (ref 26.0–34.0)
MCHC: 32.5 g/dL (ref 30.0–36.0)
MCV: 88.3 fL (ref 80.0–100.0)
Platelets: 240 10*3/uL (ref 150–400)
RBC: 5.2 MIL/uL (ref 4.22–5.81)
RDW: 14.3 % (ref 11.5–15.5)
WBC: 4.4 10*3/uL (ref 4.0–10.5)
nRBC: 0 % (ref 0.0–0.2)

## 2023-07-25 LAB — LIPASE, BLOOD: Lipase: 25 U/L (ref 11–51)

## 2023-07-25 MED ORDER — IOHEXOL 300 MG/ML  SOLN
100.0000 mL | Freq: Once | INTRAMUSCULAR | Status: AC | PRN
  Administered 2023-07-25: 100 mL via INTRAVENOUS

## 2023-07-25 MED ORDER — ONDANSETRON 4 MG PO TBDP: 4.0000 mg | ORAL_TABLET | Freq: Three times a day (TID) | ORAL | 0 refills | Status: AC | PRN

## 2023-07-25 MED ORDER — SODIUM CHLORIDE 0.9 % IV BOLUS
1000.0000 mL | Freq: Once | INTRAVENOUS | Status: AC
  Administered 2023-07-25: 1000 mL via INTRAVENOUS

## 2023-07-25 NOTE — Telephone Encounter (Signed)
 Requested medications are due for refill today.  yes  Requested medications are on the active medications list.  yes  Last refill. 04/21/2023 #90 0 rf  Future visit scheduled.   yes  Notes to clinic.  Labs are expired.    Requested Prescriptions  Pending Prescriptions Disp Refills   allopurinol (ZYLOPRIM) 300 MG tablet [Pharmacy Med Name: ALLOPURINOL 300 MG TAB] 90 tablet 0    Sig: TAKE ONE (1) TABLET BY MOUTH EVERY DAY     Endocrinology:  Gout Agents - allopurinol Failed - 07/25/2023 10:17 AM      Failed - Uric Acid in normal range and within 360 days    Uric Acid, Serum  Date Value Ref Range Status  10/31/2020 5.1 4.0 - 8.0 mg/dL Final    Comment:    Therapeutic target for gout patients: <6.0 mg/dL .          Passed - Cr in normal range and within 360 days    Creat  Date Value Ref Range Status  01/16/2023 1.08 0.70 - 1.30 mg/dL Final   Creatinine, Ser  Date Value Ref Range Status  07/25/2023 1.14 0.61 - 1.24 mg/dL Final   Creatinine, Urine  Date Value Ref Range Status  01/16/2023 81 20 - 320 mg/dL Final         Passed - Valid encounter within last 12 months    Recent Outpatient Visits           1 month ago Acute midline low back pain without sciatica   [facility] Donita Brooks, MD   5 months ago Type 2 diabetes mellitus with other specified complication, without long-term current use of insulin (HCC)   [place] [facility] Donita Brooks, MD   6 months ago Type 2 diabetes mellitus with other specified complication, without long-term current use of insulin (HCC)   [place] [facility] Pickard, Priscille Heidelberg, MD   10 months ago DDD (degenerative disc disease), cervical   [place] [facility] Pickard, Priscille Heidelberg, MD   1 year ago DDD (degenerative disc disease), cervical   [place] [facility] Pickard, Priscille Heidelberg, MD              Passed - CBC within  normal limits and completed in the last 12 months    WBC  Date Value Ref Range Status  07/25/2023 4.4 4.0 - 10.5 K/uL Final   RBC  Date Value Ref Range Status  07/25/2023 5.20 4.22 - 5.81 MIL/uL Final   Hemoglobin  Date Value Ref Range Status  07/25/2023 14.9 13.0 - 17.0 g/dL Final   HCT  Date Value Ref Range Status  07/25/2023 45.9 39.0 - 52.0 % Final   MCHC  Date Value Ref Range Status  07/25/2023 32.5 30.0 - 36.0 g/dL Final   [facility]  Date Value Ref Range Status  07/25/2023 28.7 26.0 - 34.0 pg Final   MCV  Date Value Ref Range Status  07/25/2023 88.3 80.0 - 100.0 fL Final   No results found for: "PLTCOUNTKUC", "LABPLAT", "POCPLA" RDW  Date Value Ref Range Status  07/25/2023 14.3 11.5 - 15.5 % Final         Signed Prescriptions Disp Refills   OZEMPIC, 0.25 OR 0.5 MG/DOSE, 2 MG/3ML SOPN 3 mL 1    Sig: INJECT 0.5MG  INTO THE SKIN ONCE A WEEK     Endocrinology:  Diabetes - GLP-1 Receptor  Agonists - semaglutide Failed - 07/25/2023 10:17 AM      Failed - HBA1C in normal range and within 180 days    Hgb A1c MFr Bld  Date Value Ref Range Status  01/16/2023 7.7 (H) <5.7 % of total Hgb Final    Comment:    For someone without known diabetes, a hemoglobin A1c value of 6.5% or greater indicates that they may have  diabetes and this should be confirmed with a follow-up  test. . For someone with known diabetes, a value <7% indicates  that their diabetes is well controlled and a value  greater than or equal to 7% indicates suboptimal  control. A1c targets should be individualized based on  duration of diabetes, age, comorbid conditions, and  other considerations. . Currently, no consensus exists regarding use of hemoglobin A1c for diagnosis of diabetes for children. .          Passed - Cr in normal range and within 360 days    Creat  Date Value Ref Range Status  01/16/2023 1.08 0.70 - 1.30 mg/dL Final   Creatinine, Ser  Date Value Ref Range Status  07/25/2023  1.14 0.61 - 1.24 mg/dL Final   Creatinine, Urine  Date Value Ref Range Status  01/16/2023 81 20 - 320 mg/dL Final         Passed - Valid encounter within last 6 months    Recent Outpatient Visits           1 month ago Acute midline low back pain without sciatica   [place] [facility] Donita Brooks, MD   5 months ago Type 2 diabetes mellitus with other specified complication, without long-term current use of insulin [facility])   [place] [facility] Donita Brooks, MD   6 months ago Type 2 diabetes mellitus with other specified complication, without long-term current use of insulin [facility])   [place] [facility] Pickard, Priscille Heidelberg, MD   10 months ago DDD (degenerative disc disease), cervical   [place] [facility] Donita Brooks, MD   1 year ago DDD (degenerative disc disease), cervical   [place] [facility] Pickard, Priscille Heidelberg, MD

## 2023-07-25 NOTE — Telephone Encounter (Signed)
 Requested Prescriptions  Pending Prescriptions Disp Refills   OZEMPIC, 0.25 OR 0.5 MG/DOSE, 2 MG/3ML SOPN [Pharmacy Med Name: OZEMPIC (0.25 OR 0.5 MG/DOSE) 2 MG/] 3 mL 1    Sig: INJECT 0.5MG  INTO THE SKIN ONCE A WEEK     Endocrinology:  Diabetes - GLP-1 Receptor Agonists - semaglutide Failed - 07/25/2023 10:16 AM      Failed - HBA1C in normal range and within 180 days    Hgb A1c MFr Bld  Date Value Ref Range Status  01/16/2023 7.7 (H) <5.7 % of total Hgb Final    Comment:    For someone without known diabetes, a hemoglobin A1c value of 6.5% or greater indicates that they may have  diabetes and this should be confirmed with a follow-up  test. . For someone with known diabetes, a value <7% indicates  that their diabetes is well controlled and a value  greater than or equal to 7% indicates suboptimal  control. A1c targets should be individualized based on  duration of diabetes, age, comorbid conditions, and  other considerations. . Currently, no consensus exists regarding use of hemoglobin A1c for diagnosis of diabetes for children. .          Passed - Cr in normal range and within 360 days    Creat  Date Value Ref Range Status  01/16/2023 1.08 0.70 - 1.30 mg/dL Final   Creatinine, Ser  Date Value Ref Range Status  07/25/2023 1.14 0.61 - 1.24 mg/dL Final   Creatinine, Urine  Date Value Ref Range Status  01/16/2023 81 20 - 320 mg/dL Final         Passed - Valid encounter within last 6 months    Recent Outpatient Visits           1 month ago Acute midline low back pain without sciatica   [place] [facility] Donita Brooks, MD   5 months ago Type 2 diabetes mellitus with other specified complication, without long-term current use of insulin [facility])   [place] [facility] Donita Brooks, MD   6 months ago Type 2 diabetes mellitus with other specified complication, without long-term current use of insulin [facility])   [place]  [facility] Pickard, Priscille Heidelberg, MD   10 months ago DDD (degenerative disc disease), cervical   [place] [facility] Pickard, Priscille Heidelberg, MD   1 year ago DDD (degenerative disc disease), cervical   [place] [facility] Pickard, Priscille Heidelberg, MD               allopurinol (ZYLOPRIM) 300 MG tablet [Pharmacy Med Name: ALLOPURINOL 300 MG TAB] 90 tablet 0    Sig: TAKE ONE (1) TABLET BY MOUTH EVERY DAY     Endocrinology:  Gout Agents - allopurinol Failed - 07/25/2023 10:16 AM      Failed - Uric Acid in normal range and within 360 days    Uric Acid, Serum  Date Value Ref Range Status  10/31/2020 5.1 4.0 - 8.0 mg/dL Final    Comment:    Therapeutic target for gout patients: <6.0 mg/dL .          Passed - Cr in normal range and within 360 days    Creat  Date Value Ref Range Status  01/16/2023 1.08 0.70 - 1.30 mg/dL Final   Creatinine, Ser  Date Value Ref Range Status  07/25/2023 1.14 0.61 - 1.24 mg/dL Final  Creatinine, Urine  Date Value Ref Range Status  01/16/2023 81 20 - 320 mg/dL Final         Passed - Valid encounter within last 12 months    Recent Outpatient Visits           1 month ago Acute midline low back pain without sciatica   [place] [facility] Donita Brooks, MD   5 months ago Type 2 diabetes mellitus with other specified complication, without long-term current use of insulin (HCC)   [place] [facility] Donita Brooks, MD   6 months ago Type 2 diabetes mellitus with other specified complication, without long-term current use of insulin (HCC)   [place] [facility] Pickard, Priscille Heidelberg, MD   10 months ago DDD (degenerative disc disease), cervical   [place] [facility] Pickard, Priscille Heidelberg, MD   1 year ago DDD (degenerative disc disease), cervical   [place] [facility] Pickard, Priscille Heidelberg, MD               Passed - CBC within normal limits and completed in the last 12 months    WBC  Date Value Ref Range Status  07/25/2023 4.4 4.0 - 10.5 K/uL Final   RBC  Date Value Ref Range Status  07/25/2023 5.20 4.22 - 5.81 MIL/uL Final   Hemoglobin  Date Value Ref Range Status  07/25/2023 14.9 13.0 - 17.0 g/dL Final   HCT  Date Value Ref Range Status  07/25/2023 45.9 39.0 - 52.0 % Final   MCHC  Date Value Ref Range Status  07/25/2023 32.5 30.0 - 36.0 g/dL Final   [facility]  Date Value Ref Range Status  07/25/2023 28.7 26.0 - 34.0 pg Final   MCV  Date Value Ref Range Status  07/25/2023 88.3 80.0 - 100.0 fL Final   No results found for: "PLTCOUNTKUC", "LABPLAT", "POCPLA" RDW  Date Value Ref Range Status  07/25/2023 14.3 11.5 - 15.5 % Final

## 2023-07-25 NOTE — ED Provider Notes (Signed)
 [place] EMERGENCY DEPARTMENT AT [facility] Provider Note   CSN: [id number] Arrival date & time: 07/25/23  0805     History  Chief Complaint  Patient presents with   Abdominal Pain    Xavier Daniels is a 58 y.o. male.  He has a history of hypertension diabetes.  Complaining of upper abdominal pain mild associated with 3-4 episodes of diarrhea a day for the last 3 to 4 days.  No fever no nausea or vomiting.  No blood in the diarrhea.  Only recent change he can think of is that he stopped his Ozempic for a week.  No recent travel or sick contacts.  The history is provided by the patient.  Abdominal Pain Pain location:  Epigastric Pain quality: aching   Pain severity:  Mild Onset quality:  Gradual Duration:  4 days Timing:  Intermittent Progression:  Unchanged Chronicity:  New Context: not trauma   Relieved by:  None tried Worsened by:  Nothing Ineffective treatments:  None tried Associated symptoms: diarrhea   Associated symptoms: no chest pain, no constipation, no cough, no dysuria, no fever, no hematemesis, no hematochezia, no hematuria, no nausea and no vomiting        Home Medications Prior to Admission medications   Medication Sig Start Date End Date Taking? Authorizing Provider  allopurinol (ZYLOPRIM) 300 MG tablet TAKE ONE (1) TABLET BY MOUTH EVERY DAY 04/21/23   Donita Brooks, MD  amLODipine (NORVASC) 10 MG tablet TAKE ONE (1) TABLET BY MOUTH EVERY DAY THIS REPLACES ATENOLOL 02/12/23   Donita Brooks, MD  colchicine 0.6 MG tablet Take 1 tablet (0.6 mg total) by mouth 2 (two) times daily as needed (gout). 02/15/21   Donita Brooks, MD  diclofenac Sodium (VOLTAREN) 1 % GEL APPLY 2 GRAMS FOUR TIMES A DAY 09/10/22   Donita Brooks, MD  HYDROcodone-acetaminophen (NORCO) 7.5-325 MG tablet Take 1 tablet by mouth every 6 (six) hours as needed for moderate pain (pain score 4-6). 06/06/23   Donita Brooks, MD  ibuprofen (ADVIL) 800 MG tablet TAKE ONE  TABLET BY MOUTH EVERY EIGHT HOURS AS NEEDED 07/03/22   Donita Brooks, MD  JARDIANCE 25 MG TABS tablet TAKE ONE (1) TABLET BY MOUTH EVERY DAY BEFORE BREAKFAST 02/12/23   Donita Brooks, MD  levocetirizine (XYZAL ALLERGY 24HR) 5 MG tablet Take 1 tablet (5 mg total) by mouth every evening. 09/03/21   Donita Brooks, MD  lidocaine (LIDODERM) 5 % Place 1 patch onto the skin daily. Remove & Discard patch within 12 hours or as directed by MD 09/19/22   Gilda Crease, MD  losartan (COZAAR) 100 MG tablet TAKE ONE (1) TABLET BY MOUTH EVERY DAY 04/21/23   Donita Brooks, MD  OZEMPIC, 0.25 OR 0.5 MG/DOSE, 2 MG/3ML SOPN INJECT 0.5MG  INTO THE SKIN ONCE A WEEK 04/21/23   Donita Brooks, MD  pravastatin (PRAVACHOL) 40 MG tablet TAKE ONE (1) TABLET BY MOUTH EVERY DAY 03/20/23   Donita Brooks, MD  pregabalin (LYRICA) 100 MG capsule TAKE ONE CAPSULE (100MG  TOTAL) BY MOUTH THREE TIMES DAILY 11/12/22   Donita Brooks, MD  RESTASIS 0.05 % ophthalmic emulsion  05/08/23   [provider]  sildenafil (VIAGRA) 100 MG tablet TAKE ONE-HALF TO ONE TABLET (50 TO 100MG TOTAL) BY MOUTH DAILY AS NEEDED FOR ERECTILE DYSFUNCTION 07/08/22   Donita Brooks, MD      Allergies    Metformin and related  Review of Systems   Review of Systems  Constitutional:  Negative for fever.  Respiratory:  Negative for cough.   Cardiovascular:  Negative for chest pain.  Gastrointestinal:  Positive for abdominal pain and diarrhea. Negative for constipation, hematemesis, hematochezia, nausea and vomiting.  Genitourinary:  Negative for dysuria and hematuria.    Physical Exam Updated Vital Signs BP (!) 143/91 (BP Location: Right Arm)   Pulse 100   Temp 98.3 F (36.8 C) (Oral)   Resp 17   SpO2 97%  Physical Exam Vitals and nursing note reviewed.  Constitutional:      General: He is not in acute distress.    Appearance: He is well-developed.  HENT:     Head: Normocephalic and atraumatic.  Eyes:      Conjunctiva/sclera: Conjunctivae normal.  Cardiovascular:     Rate and Rhythm: Regular rhythm. Tachycardia present.     Heart sounds: No murmur heard. Pulmonary:     Effort: Pulmonary effort is normal. No respiratory distress.     Breath sounds: Normal breath sounds.  Abdominal:     Palpations: Abdomen is soft.     Tenderness: There is no abdominal tenderness. There is no guarding or rebound.  Musculoskeletal:        General: No swelling.     Cervical back: Neck supple.  Skin:    General: Skin is warm and dry.     Capillary Refill: Capillary refill takes less than 2 seconds.  Neurological:     General: No focal deficit present.     Mental Status: He is alert.     ED Results / Procedures / Treatments   Labs (all labs ordered are listed, but only abnormal results are displayed) Labs Reviewed  COMPREHENSIVE METABOLIC PANEL WITH GFR - Abnormal; Notable for the following components:      Result Value   Potassium 3.4 (*)    CO2 19 (*)    Glucose, Bld 140 (*)    All other components within normal limits  URINALYSIS, ROUTINE W REFLEX MICROSCOPIC - Abnormal; Notable for the following components:   Color, Urine STRAW (*)    Specific Gravity, Urine >1.046 (*)    Glucose, UA >=500 (*)    All other components within normal limits  LIPASE, BLOOD  CBC    EKG EKG Interpretation Date/Time:  Friday July 25 2023 08:29:32 EDT Ventricular Rate:  114 PR Interval:  139 QRS Duration:  100 QT Interval:  337 QTC Calculation: 465 R Axis:   84  Text Interpretation: Sinus tachycardia Low voltage, precordial leads Borderline T abnormalities, inferior leads No significant change since prior 3/23 except rate slower Confirmed by Meridee Score [phone]) on 07/25/2023 8:31:47 AM  Radiology US Abdomen Limited RUQ (LIVER/GB) Result Date: 07/25/2023 CLINICAL DATA:  [id number] Upper abdominal pain [id number] EXAM: ULTRASOUND ABDOMEN LIMITED RIGHT UPPER QUADRANT COMPARISON:  July 25, 2023 CT of the abdomen  and pelvis FINDINGS: Gallbladder: The gallbladder is decompressed with either a single large stone or multiple gallstones causing wall echo shadow sign. No sonographic Murphy sign. No pericholecystic fluid. The gallbladder wall was not well visualized or evaluated. It does not appear thickened on the CT. Common bile duct: Diameter: 3 mm Liver: No focal lesion identified. Increased echogenicity. Portal vein is patent on color Doppler imaging with normal direction of blood flow towards the liver. Other: None. IMPRESSION: 1. The gallbladder is decompressed containing either a single large stone or multiple gallstones causing wall echo shadow sign. The gallbladder wall was not  well visualized or evaluated, but does not appear thickened on the recent CT. No sonographic Murphy's sign or pericholecystic fluid to suggest acute cholecystitis. If further evaluation is desired for the changes on the recent CT, a nonemergent multiphase abdominal MRI with IV contrast could be considered. 2. Hepatic steatosis. Electronically Signed   By: Wallie Char M.D.   On: 07/25/2023 11:53   CT ABDOMEN PELVIS W CONTRAST Result Date: 07/25/2023 CLINICAL DATA:  Two day history of epigastric abdominal pain and diarrhea EXAM: CT ABDOMEN AND PELVIS WITH CONTRAST TECHNIQUE: Multidetector CT imaging of the abdomen and pelvis was performed using the standard protocol following bolus administration of intravenous contrast. RADIATION DOSE REDUCTION: This exam was performed according to the departmental dose-optimization program which includes automated exposure control, adjustment of the mA and/or kV according to patient size and/or use of iterative reconstruction technique. CONTRAST:  OMNIPAQUE IOHEXOL 300 MG/ML  SOLN COMPARISON:  None Available. FINDINGS: Lower chest: No focal consolidation or pulmonary nodule in the lung bases. No pleural effusion or pneumothorax demonstrated. Partially imaged heart size is normal. Hepatobiliary: No  focal hepatic lesions. No intra or extrahepatic biliary ductal dilation. Luminal narrowing of the mid gallbladder body (2:24) with slightly indistinct appearance of the gallbladder fundus. Pancreas: No focal lesions or main ductal dilation. Spleen: Normal in size without focal abnormality. Adrenals/Urinary Tract: No adrenal nodules. No suspicious renal mass or hydronephrosis. Punctate bilateral nonobstructing stones. Suboptimal evaluation of left ureterovesical junction due to beam hardening artifact from hip arthroplasty. No focal bladder wall thickening. Stomach/Bowel: Normal appearance of the stomach. No evidence of bowel wall thickening, distention, or inflammatory changes. Normal appendix. Vascular/Lymphatic: No significant vascular findings are present. No enlarged abdominal or pelvic lymph nodes. Reproductive: Prostate is unremarkable. Other: No free fluid, fluid collection, or free air. Musculoskeletal: No acute or abnormal lytic or blastic osseous lesions. Left hip arthroplasty. Multilevel degenerative changes of the partially imaged thoracic and lumbar spine. Small fat-containing bilateral inguinal hernias. IMPRESSION: 1. Luminal narrowing of the mid gallbladder body with slightly indistinct appearance of the gallbladder fundus, which may be related to underdistention or adenomyomatosis. Recommend further evaluation with right upper quadrant ultrasound examination. 2. Punctate bilateral nonobstructing renal stones. Electronically Signed   By: Agustin Cree M.D.   On: 07/25/2023 10:26    Procedures Procedures    Medications Ordered in ED Medications  sodium chloride 0.9 % bolus 1,000 mL (0 mLs Intravenous Stopped 07/25/23 1204)  iohexol (OMNIPAQUE) 300 MG/ML solution 100 mL (100 mLs Intravenous Contrast Given 07/25/23 0955)    ED Course/ Medical Decision Making/ A&P Clinical Course as of 07/25/23 1639  Fri Jul 25, 2023  1201 Ultrasound showing gallstones but no evidence of acute cholecystitis.  He  is not endorsing any right upper quadrant abdominal pain at this time.  Reviewed with patient and he has a PCP at follow-up in a week.  Recommended clear liquid diet advance as tolerated.  Return instructions discussed [MB]    Clinical Course User Index [MB] Terrilee Files, MD                                 Medical Decision Making Amount and/or Complexity of Data Reviewed Labs: ordered. Radiology: ordered.  Risk Prescription drug management.   This patient complains of upper abdominal pain and diarrhea; this involves an extensive number of treatment Options and is a complaint that carries with it a high risk of  complications and morbidity. The differential includes gastroenteritis, food poisoning, diverticulitis, colitis, biliary colic, peptic ulcer disease  I ordered, reviewed and interpreted labs, which included CBC normal chemistries with mildly low bicarb normal LFTs, urinalysis unremarkable I ordered medication IV fluids and reviewed PMP when indicated. I ordered imaging studies which included CT abdomen and pelvis and right upper quadrant ultrasound and I independently    visualized and interpreted imaging which showed gallstones without sonographic Murphy's Previous records obtained and reviewed in epic no recent admissions Cardiac monitoring reviewed, sinus rhythm Social determinants considered, financial insecurity Food insecurity physically inactive Critical Interventions: None  After the interventions stated above, I reevaluated the patient and found patient to be feeling better and tolerating p.o. Admission and further testing considered, no indications for admission or further workup at this time.  Recommended symptomatic treatment with clear liquid diet advance as tolerated.  Prescription for Zofran.  Return instructions discussed         Final Clinical Impression(s) / ED Diagnoses Final diagnoses:  Pain of upper abdomen  Diarrhea, unspecified type   Calculus of gallbladder without cholecystitis without obstruction    Rx / DC Orders ED Discharge Orders          Ordered    ondansetron (ZOFRAN-ODT) 4 MG disintegrating tablet  Every 8 hours PRN        07/25/23 1212              Terrilee Files, MD 07/25/23 1642

## 2023-07-25 NOTE — ED Triage Notes (Signed)
 Pt reports epigastric pain and diarrhea x 2 days. Denis fevers or SHOB.

## 2023-07-25 NOTE — Discharge Instructions (Signed)
 You were seen in the emergency department for diarrhea upper abdominal pain.  Your lab work was unremarkable but you had a CAT scan and ultrasound that showed some signs of gallstones.  It is unclear if this is the cause of your symptoms.  Please start with a clear liquid diet advance as tolerated.  Follow-up with your primary care doctor.  Discuss with them whether you need to see a surgeon.  Return to the emergency department if any worsening pain or high fever.

## 2023-07-31 ENCOUNTER — Encounter: Payer: Self-pay | Admitting: Family Medicine

## 2023-07-31 ENCOUNTER — Ambulatory Visit: Admitting: Family Medicine

## 2023-07-31 VITALS — BP 130/76 | HR 93 | Temp 98.0°F | Ht 73.0 in | Wt 243.6 lb

## 2023-07-31 DIAGNOSIS — G8929 Other chronic pain: Secondary | ICD-10-CM | POA: Diagnosis not present

## 2023-07-31 DIAGNOSIS — M545 Low back pain, unspecified: Secondary | ICD-10-CM | POA: Diagnosis not present

## 2023-07-31 MED ORDER — MELOXICAM 15 MG PO TABS: 15.0000 mg | ORAL_TABLET | Freq: Every day | ORAL | 3 refills | Status: DC

## 2023-07-31 NOTE — Progress Notes (Signed)
 Subjective:    Patient ID: Xavier Daniels, male    DOB: 09/16/1965, 58 y.o.   MRN: [id number]  Shoulder Pain   Back Pain  05/2023 Patient has been diagnosed with cervical radiculopathy.  Please see my previous office visits.  He has been dealing with pain radiating from his neck into his left shoulder and left arm for more than a year.  A CT scan of the cervical spine last year showed degenerative disc disease at C5-C6.  He has been treated with steroid taper packs in the past which have helped substantially.  When I last saw the patient in March, we decided not to pursue an MRI because his pain was relatively well-controlled.  However recently had to go to the emergency room again due to exacerbation of the pain.  He is seeing an orthopedist who has ordered an MRI of his neck on Saturday.  He is trying Lyrica 75 mg twice daily which has helped minimally  06/06/23 Patient continues to have occasional pain radiating from his neck into his shoulder.  He would like a refill on his pain medication that he would use sparingly.  His pain has improved dramatically however he still has pain on occasion.  We discussed this and I will be willing to give him 30 pills with the intention that the medication would last 3 to 6 months if not longer.  Patient states that this is reasonable and would appreciate that.  Over the last month he has developed pain in his lower back pain located around the left L4-L5.  He describes it as a dorsal bandlike pain.  There is no exacerbating or alleviating factors.  The pain will go and come without reason.  It is a dull aching pain.  He denies any lumbar radiculopathy.  He denies any dysuria or hematuria or urgency or frequency.  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.  At that time, my plan was: I agreed to give the patient 89 Norco with the intention that this medication would last 3 to 6 months.  He will take this sparingly and intermittently for his neck and  cervical pain.  Obtain an x-ray of the lumbar spine.  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 Lumbar alignment is normal. Vertebral body heights are maintained. Mild disc space narrowing at L3-L4 and L4-S1. Multilevel degenerative osteophytes. Moderate lower lumbar facet degenerative changes.  Patient continues to endorse daily pain in his lower back primarily to the right side at the level of L4.  He denies any sciatica.  He denies any saddle anesthesia or bowel or bladder incontinence.  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, CLEAN FOR 13 YEARS   Past Surgical History:  Procedure Laterality Date   BIOPSY  04/20/2019   Procedure: BIOPSY;  Surgeon: West Bali, MD;  Location: [facility];  Service: Endoscopy;;  transverse polyp x1   COLONOSCOPY WITH PROPOFOL N/A 04/20/2019   Procedure: COLONOSCOPY WITH PROPOFOL;  Surgeon: West Bali, MD;  Location:  [facility];  Service: Endoscopy;  Laterality: N/A;  9:30am-office rescheduled 04/20/19 @ 8:30am   POLYPECTOMY  04/20/2019   Procedure: POLYPECTOMY;  Surgeon: West Bali, MD;  Location: [facility];  Service: Endoscopy;;  transversex1, rectal polyp   TOTAL HIP ARTHROPLASTY  2007   due to injury for avascular necrosis.   Current Outpatient Medications on File Prior to Visit  Medication Sig Dispense Refill   allopurinol (ZYLOPRIM) 300 MG tablet TAKE ONE (1) TABLET BY MOUTH EVERY DAY 90 tablet 0   amLODipine (NORVASC) 10 MG tablet TAKE ONE (1) TABLET BY MOUTH EVERY DAY THIS REPLACES ATENOLOL 90 tablet 3   HYDROcodone-acetaminophen  (NORCO) 7.5-325 MG tablet Take 1 tablet by mouth every 6 (six) hours as needed for moderate pain (pain score 4-6). 30 tablet 0   ibuprofen (ADVIL) 800 MG tablet TAKE ONE TABLET BY MOUTH EVERY EIGHT HOURS AS NEEDED 60 tablet 1   JARDIANCE 25 MG TABS tablet TAKE ONE (1) TABLET BY MOUTH EVERY DAY BEFORE BREAKFAST 90 tablet 3   levocetirizine (XYZAL ALLERGY 24HR) 5 MG tablet Take 1 tablet (5 mg total) by mouth every evening. 30 tablet 5   lidocaine (LIDODERM) 5 % Place 1 patch onto the skin daily. Remove & Discard patch within 12 hours or as directed by MD 30 patch 0   losartan (COZAAR) 100 MG tablet TAKE ONE (1) TABLET BY MOUTH EVERY DAY 90 tablet 3   ondansetron (ZOFRAN-ODT) 4 MG disintegrating tablet Take 1 tablet (4 mg total) by mouth every 8 (eight) hours as needed for nausea or vomiting. 20 tablet 0   OZEMPIC, 0.25 OR 0.5 MG/DOSE, 2 MG/3ML SOPN INJECT 0.5MG  INTO THE SKIN ONCE A WEEK 3 mL 1   pravastatin (PRAVACHOL) 40 MG tablet TAKE ONE (1) TABLET BY MOUTH EVERY DAY 90 tablet 1   RESTASIS 0.05 % ophthalmic emulsion Place 1 drop into both eyes 2 (two) times daily.     No current facility-administered medications on file prior to visit.   Allergies  Allergen Reactions   Metformin And Related     Diarrhea    Social History   Socioeconomic History   Marital status: Married    Spouse name: Not on file   Number of children: Not on file   Years of education: Not on file   Highest education level: Never attended school  Occupational History   Not on file  Tobacco Use   Smoking status: Never   Smokeless tobacco: Never  Vaping Use   Vaping status: Never Used  Substance and Sexual Activity   Alcohol use: No    Comment: previous heavy use, quit 2004   Drug use: No    Comment: previous marijuana use   Sexual activity: Yes    Birth control/protection: None  Other Topics Concern   Not on file  Social History Narrative   Not on file   Social Drivers of Health   Financial Resource  Strain: High Risk (06/05/2023)   Overall Financial Resource Strain (CARDIA)    Difficulty of Paying Living Expenses: Very hard  Food Insecurity: Food Insecurity Present (06/05/2023)   Hunger Vital Sign    Worried About Running Out of Food in the Last Year: Sometimes true    Ran Out of Food in the Last Year: Sometimes true  Transportation Needs: No Transportation Needs (06/05/2023)   PRAPARE - Administrator, Civil Service (Medical): No    Lack of Transportation (Non-Medical): No  Physical Activity:  Inactive (06/05/2023)   Exercise Vital Sign    Days of Exercise per Week: 0 days    Minutes of Exercise per Session: 60 min  Stress: No Stress Concern Present (06/05/2023)   [employer] of Occupational Health - Occupational Stress Questionnaire    Feeling of Stress : Not at all  Social Connections: Socially Integrated (06/05/2023)   Social Connection and Isolation Panel [NHANES]    Frequency of Communication with Friends and Family: Twice a week    Frequency of Social Gatherings with Friends and Family: Twice a week    Attends Religious Services: More than 4 times per year    Active Member of [employer] or Organizations: Yes    Attends Engineer, structural: More than 4 times per year    Marital Status: Married  Catering manager Violence: Not on file      Review of Systems  Musculoskeletal:  Positive for back pain.  All other systems reviewed and are negative.      Objective:   Physical Exam Vitals reviewed.  Constitutional:      General: He is not in acute distress.    Appearance: Normal appearance. He is not ill-appearing or toxic-appearing.  Cardiovascular:     Rate and Rhythm: Normal rate and regular rhythm.     Pulses: Normal pulses.     Heart sounds: Normal heart sounds. No murmur heard.    No friction rub. No gallop.  Pulmonary:     Effort: Pulmonary effort is normal. No respiratory distress.     Breath sounds: Normal breath sounds. No stridor. No  wheezing, rhonchi or rales.  Musculoskeletal:     Lumbar back: No spasms, tenderness or bony tenderness. Decreased range of motion.       Back:  Neurological:     Mental Status: He is alert.           Assessment & Plan:  Chronic bilateral low back pain without sciatica - Plan: Ambulatory referral to Physical Therapy I would recommend a trial of physical therapy.  He can also use meloxicam 15 mg daily.  X-ray suggest degenerative changes consistent with wear and tear and early arthritis.  Hopefully the meloxicam and physical therapy will be beneficial

## 2023-08-08 ENCOUNTER — Ambulatory Visit
Admission: EM | Admit: 2023-08-08 | Discharge: 2023-08-08 | Disposition: A | Discharge status: Home / Self Care | Attending: Nurse Practitioner | Admitting: Nurse Practitioner

## 2023-08-08 DIAGNOSIS — Z8639 Personal history of other endocrine, nutritional and metabolic disease: Secondary | ICD-10-CM

## 2023-08-08 DIAGNOSIS — L03213 Periorbital cellulitis: Secondary | ICD-10-CM

## 2023-08-08 MED ORDER — SULFAMETHOXAZOLE-TRIMETHOPRIM 800-160 MG PO TABS: 1.0000 | ORAL_TABLET | Freq: Two times a day (BID) | ORAL | 0 refills | Status: AC

## 2023-08-08 NOTE — ED Provider Notes (Addendum)
 [facility]    CSN: [id number] Arrival date & time: 08/08/23  0810      History   Chief Complaint Chief Complaint  Patient presents with   Facial Swelling    HPI Xavier Daniels is a 58 y.o. male.   The history is provided by the patient.   Patient presents for complaints of pain and swelling to the left upper eyelid that is been present for the past 2 days.  Patient states prior to his symptoms starting, he was riding down the road when pollen flew into his eye.  He states thereafter, he developed the redness and swelling to the left upper eyelid.  He states that he does have some itching in the corner of his eye.  He denies fever, chills, visual changes, eye drainage, eye redness, chest pain, abdominal pain, nausea, vomiting, or diarrhea.  States he has been using an over-the-counter allergy eyedrop and using warm and cool compresses to the affected area.  Denies use of glasses or contact lens.  Patient with history of diabetes.  Per review of chart, last A1c was 7.7 in October 2024.  Past Medical History:  Diagnosis Date   Arthritis    Colon cancer screening 10/20/2018   Diabetes mellitus without complication (HCC)    Gout    Gout    Hypertension    Substance abuse (HCC)    ETOH, 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, CLEAN FOR 13 YEARS    Patient Active Problem List   Diagnosis Date Noted   Chronic pain of left knee 12/14/2019   Depression, major, single episode, mild (HCC) 12/14/2019   Left ankle pain 12/14/2019   T2DM (type 2 diabetes mellitus) (HCC) 09/10/2019   Essential hypertension 06/06/2014   Hyperlipidemia 06/06/2014   Gout 06/06/2014   Obesity 06/06/2014    Past Surgical History:  Procedure Laterality Date   BIOPSY  04/20/2019   Procedure: BIOPSY;  Surgeon: Alyce Jubilee, MD;  [facility]: [facility];  Service: Endoscopy;;  transverse polyp x1   COLONOSCOPY WITH PROPOFOL  N/A 04/20/2019   Procedure: COLONOSCOPY WITH PROPOFOL ;  Surgeon: Alyce Jubilee, MD;   Location: [facility];  Service: Endoscopy;  Laterality: N/A;  9:30am-office rescheduled 04/20/19 @ 8:30am   POLYPECTOMY  04/20/2019   Procedure: POLYPECTOMY;  Surgeon: Alyce Jubilee, MD;  Location: [facility];  Service: Endoscopy;;  transversex1, rectal polyp   TOTAL HIP ARTHROPLASTY  2007   due to injury for avascular necrosis.       Home Medications    Prior to Admission medications   Medication Sig Start Date End Date Taking? Authorizing Provider  allopurinol  (ZYLOPRIM ) 300 MG tablet TAKE ONE (1) TABLET BY MOUTH EVERY DAY 07/25/23   Austine Lefort, MD  amLODipine  (NORVASC ) 10 MG tablet TAKE ONE (1) TABLET BY MOUTH EVERY DAY THIS REPLACES ATENOLOL  02/12/23   Austine Lefort, MD  HYDROcodone -acetaminophen  (NORCO) 7.5-325 MG tablet Take 1 tablet by mouth every 6 (six) hours as needed for moderate pain (pain score 4-6). 06/06/23   Austine Lefort, MD  ibuprofen  (ADVIL ) 800 MG tablet TAKE ONE TABLET BY MOUTH EVERY EIGHT HOURS AS NEEDED 07/03/22   Austine Lefort, MD  JARDIANCE  25 MG TABS tablet TAKE ONE (1) TABLET BY MOUTH EVERY DAY BEFORE BREAKFAST 02/12/23   Austine Lefort, MD  levocetirizine (XYZAL  ALLERGY 24HR) 5 MG tablet Take 1 tablet (5 mg total) by mouth every evening. 09/03/21   Austine Lefort, MD  lidocaine  (LIDODERM )  5 % Place 1 patch onto the skin daily. Remove & Discard patch within 12 hours or as directed by MD 09/19/22   Ballard Bongo, MD  losartan  (COZAAR ) 100 MG tablet TAKE ONE (1) TABLET BY MOUTH EVERY DAY 04/21/23   Austine Lefort, MD  meloxicam  (MOBIC ) 15 MG tablet Take 1 tablet (15 mg total) by mouth daily. 07/31/23   Austine Lefort, MD  ondansetron  (ZOFRAN -ODT) 4 MG disintegrating tablet Take 1 tablet (4 mg total) by mouth every 8 (eight) hours as needed for nausea or vomiting. 07/25/23   Tonya Fredrickson, MD  OZEMPIC , 0.25 OR 0.5 MG/DOSE, 2 MG/3ML SOPN INJECT 0.5MG  INTO THE SKIN ONCE A WEEK 07/25/23   Austine Lefort, MD  pravastatin  (PRAVACHOL )  40 MG tablet TAKE ONE (1) TABLET BY MOUTH EVERY DAY 03/20/23   Austine Lefort, MD  RESTASIS 0.05 % ophthalmic emulsion Place 1 drop into both eyes 2 (two) times daily. 05/08/23   [provider]    Family History Family History  Problem Relation Age of Onset   Diabetes Mother    Diabetes Brother     Social History Social History   Tobacco Use   Smoking status: Never   Smokeless tobacco: Never  Vaping Use   Vaping status: Never Used  Substance Use Topics   Alcohol use: No    Comment: previous heavy use, quit 2004   Drug use: No    Comment: previous marijuana use     Allergies   Metformin  and related   Review of Systems Review of Systems Per HPI  Physical Exam Triage Vital Signs ED Triage Vitals  Encounter Vitals Group     BP 08/08/23 0846 121/81     Systolic BP Percentile --      Diastolic BP Percentile --      Pulse Rate 08/08/23 0846 82     Resp 08/08/23 0846 16     Temp 08/08/23 0846 98.1 F (36.7 C)     Temp Source 08/08/23 0846 Oral     SpO2 08/08/23 0846 98 %     Weight --      Height --      Head Circumference --      Peak Flow --      Pain Score 08/08/23 0847 0     Pain Loc --      Pain Education --      Exclude from Growth Chart --    No data found.  Updated Vital Signs BP 121/81 (BP Location: Right Arm)   Pulse 82   Temp 98.1 F (36.7 C) (Oral)   Resp 16   SpO2 98%   Visual Acuity Right Eye Distance:   Left Eye Distance:   Bilateral Distance:    Right Eye Near:   Left Eye Near:    Bilateral Near:     Physical Exam Vitals and nursing note reviewed.  Constitutional:      General: He is not in acute distress.    Appearance: Normal appearance.  HENT:     Head: Normocephalic.     Mouth/Throat:     Mouth: Mucous membranes are moist.  Eyes:     General:        Left eye: No foreign body, discharge or hordeolum.     Extraocular Movements: Extraocular movements intact.     Left eye: Normal extraocular motion and no  nystagmus.     Conjunctiva/sclera: Conjunctivae normal.     Left  eye: Left conjunctiva is not injected. No chemosis.    Pupils: Pupils are equal, round, and reactive to light.     Comments: Swelling noted to the left upper eyelid. Area is erythematous, tender to palpation.   Cardiovascular:     Rate and Rhythm: Normal rate and regular rhythm.     Pulses: Normal pulses.     Heart sounds: Normal heart sounds.  Pulmonary:     Effort: Pulmonary effort is normal.     Breath sounds: Normal breath sounds.  Abdominal:     General: Bowel sounds are normal.     Palpations: Abdomen is soft.  Musculoskeletal:     Cervical back: Normal range of motion.  Skin:    General: Skin is warm and dry.  Neurological:     General: No focal deficit present.     Mental Status: He is alert and oriented to person, place, and time.  Psychiatric:        Mood and Affect: Mood normal.        Behavior: Behavior normal.      UC Treatments / Results  Labs (all labs ordered are listed, but only abnormal results are displayed) Labs Reviewed - No data to display  EKG   Radiology No results found.  Procedures Procedures (including critical care time)  Medications Ordered in UC Medications - No data to display  Initial Impression / Assessment and Plan / UC Course  I have reviewed the triage vital signs and the nursing notes.  Pertinent labs & imaging results that were available during my care of the patient were reviewed by me and considered in my medical decision making (see chart for details).  Patient with pain and swelling of the left upper eyelid, symptoms consistent with preseptal cellulitis.  Will treat with Bactrim DS 800/160 mg tablet twice daily for the next 7 days.  Supportive care recommendations were provided and discussed with the patient to include over-the-counter analgesics, over-the-counter antihistamines to help with swelling and itching of the eye, and to continue over-the-counter  eyedrops to help keep the eye moist to lubricated.  Discussed strict ER follow-up precautions with the patient.  Patient was in agreement with this plan of care and verbalized understanding.  All questions were answered.  Patient stable for discharge.   Final Clinical Impressions(s) / UC Diagnoses   Final diagnoses:  None   Discharge Instructions   None    ED Prescriptions   None    PDMP not reviewed this encounter.   Hardy Lia, NP 08/08/23 1004    Leath-Warren, Belen Bowers, NP 08/08/23 1345

## 2023-08-08 NOTE — ED Triage Notes (Signed)
 Pt states swelling to his left eye since yesterday.  States he thinks he got pollen in it.

## 2023-08-08 NOTE — Discharge Instructions (Addendum)
 Take medication as prescribed. May take over-the-counter Tylenol  as needed for pain, fever, or general discomfort. Apply cool compresses to the eye to help with pain or swelling.  Apply warm compresses to help with pain or discomfort. Avoid rubbing or manipulating the eye while symptoms persist. Continue use of over-the-counter eyedrops to help keep the eye moist and lubricated. Seek care immediately if you experience increased redness, swelling, or develop fever, chills, or other concerns. Follow-up as needed.

## 2023-08-29 ENCOUNTER — Encounter (HOSPITAL_COMMUNITY): Payer: Self-pay

## 2023-08-29 ENCOUNTER — Other Ambulatory Visit: Payer: Self-pay

## 2023-08-29 ENCOUNTER — Ambulatory Visit (HOSPITAL_COMMUNITY): Attending: Family Medicine

## 2023-08-29 DIAGNOSIS — R269 Unspecified abnormalities of gait and mobility: Secondary | ICD-10-CM | POA: Diagnosis present

## 2023-08-29 DIAGNOSIS — M5459 Other low back pain: Secondary | ICD-10-CM | POA: Diagnosis present

## 2023-08-29 DIAGNOSIS — Z7409 Other reduced mobility: Secondary | ICD-10-CM | POA: Insufficient documentation

## 2023-08-29 DIAGNOSIS — G8929 Other chronic pain: Secondary | ICD-10-CM | POA: Insufficient documentation

## 2023-08-29 DIAGNOSIS — M545 Low back pain, unspecified: Secondary | ICD-10-CM | POA: Insufficient documentation

## 2023-08-29 NOTE — Therapy (Signed)
 OUTPATIENT PHYSICAL THERAPY THORACOLUMBAR EVALUATION   Patient Name: Xavier Daniels MRN: [id number] DOB:10/01/1965, 58 y.o., male Today's Date: 08/29/2023  END OF SESSION:  PT End of Session - 08/29/23 1032     Visit Number 1    Date for PT Re-Evaluation 09/26/23    Authorization Type [place] MEDICA[employer] BLUE    Authorization Time Period seeking auth    Progress Note Due on Visit 8    PT Start Time 0930    PT Stop Time 1015    PT Time Calculation (min) 45 min    Activity Tolerance Patient tolerated treatment well    Behavior During Therapy WFL for tasks assessed/performed             Past Medical History:  Diagnosis Date   Arthritis    Colon cancer screening 10/20/2018   Diabetes mellitus without complication (HCC)    Gout    Gout    Hypertension    Substance abuse (HCC)    ETOH, 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, CLEAN FOR 13 YEARS   Past Surgical History:  Procedure Laterality Date   BIOPSY  04/20/2019   Procedure: BIOPSY;  Surgeon: Alyce Jubilee, MD;  [facility];  Service: Endoscopy;;  transverse polyp x1   COLONOSCOPY WITH PROPOFOL  N/A 04/20/2019   Procedure: COLONOSCOPY WITH PROPOFOL ;  Surgeon: Alyce Jubilee, MD;  [facility]: [facility];  Service: Endoscopy;  Laterality: N/A;  9:30am-office rescheduled 04/20/19 @ 8:30am   POLYPECTOMY  04/20/2019   Procedure: POLYPECTOMY;  Surgeon: Alyce Jubilee, MD;  [facility]: [facility];  Service: Endoscopy;;  transversex1, rectal polyp   TOTAL HIP ARTHROPLASTY  2007   due to injury for avascular necrosis.   Patient Active Problem List   Diagnosis Date Noted   Chronic pain of left knee 12/14/2019   Depression, major, single episode, mild (HCC) 12/14/2019   Left ankle pain 12/14/2019   T2DM (type 2 diabetes mellitus) (HCC) 09/10/2019   Essential hypertension 06/06/2014   Hyperlipidemia 06/06/2014   Gout 06/06/2014   Obesity 06/06/2014    PCP: Austine Lefort, MD  REFERRING PROVIDER: Austine Lefort,  MD  REFERRING DIAG: M54.50,G89.29 (ICD-10-CM) - Chronic bilateral low back pain without sciatica  Rationale for Evaluation and Treatment: Rehabilitation  THERAPY DIAG:  Other low back pain  Decreased functional mobility and endurance  Abnormal gait  ONSET DATE: Couple weeks ago  SUBJECTIVE:  SUBJECTIVE STATEMENT: Pt does not report extensive history of back pain. Pt states he got new mattress and was pretty sore for the first few weeks, states he thinks this could be the reason for increased back pain. Pt states he has gotten used it now and is sleeping better. Pt states he puts his knuckles in his back to get relief.  PERTINENT HISTORY:  History of therapy for neck and shoulder region  PAIN:  Are you having pain? Yes: NPRS scale: 6/10 Pain location: low back central and both sides Pain description: aching pain, Aggravating factors: standing, bending and twisting Relieving factors: relaxing, laying flat, rubbing massaging low back  PRECAUTIONS: None  RED FLAGS: None   WEIGHT BEARING RESTRICTIONS: No  FALLS:  Has patient fallen in last 6 months? No  OCCUPATION: not working  PLOF: Independent  PATIENT GOALS: get back pain to be more tolerable, to not notice low back pain as much  NEXT MD VISIT: after therapy  OBJECTIVE:  Note: Objective measures were completed at Evaluation unless otherwise noted.  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 Lumbar alignment is normal. Vertebral body heights are maintained. Mild disc space narrowing at L3-L4 and L4-S1. Multilevel degenerative  osteophytes. Moderate lower lumbar facet degenerative changes   IMPRESSION: 1. 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 2. Multilevel degenerative changes, greatest at the lower lumbar levels.  PATIENT SURVEYS:  Modified Oswestry 19/50   COGNITION: Overall cognitive status: Within functional limits for tasks assessed     SENSATION: WFL    POSTURE: No Significant postural limitations  PALPATION: Pt tender to palpation of lumbar spine and paraspinal musculature. Pt demonstrates abnormal tightness in erector spinae group even into thoracic region.  LUMBAR ROM:   AROM eval  Flexion 90 degrees, pain coming back up  Extension 9 degrees, pain but not as bad as forward  Right lateral flexion 25 degrees, pain   Left lateral flexion 30, little pain  Right rotation 75%  Left rotation 75%   (Blank rows = not tested)  LOWER EXTREMITY ROM:     Active  Right eval Left eval  Hip flexion    Hip extension    Hip abduction    Hip adduction    Hip internal rotation    Hip external rotation    Knee flexion    Knee extension    Ankle dorsiflexion    Ankle plantarflexion    Ankle inversion    Ankle eversion     (Blank rows = not tested)  LOWER EXTREMITY MMT:    MMT Right eval Left eval  Hip flexion 4 4-  Hip extension 4- 3+  Hip abduction 4 4-  Hip adduction 4- 3+  Hip internal rotation    Hip external rotation    Knee flexion    Knee extension    Ankle dorsiflexion    Ankle plantarflexion    Ankle inversion    Ankle eversion     (Blank rows = not tested)  LUMBAR SPECIAL TESTS:  Straight leg raise test: Negative and Slump test: Negative  FUNCTIONAL TESTS:  5 times sit to stand: 22.15 2 minute walk test: 420  GAIT: Distance walked: 420 feet Assistive device utilized: None Level of assistance: Complete Independence Comments: Pt demonstrates slight antalgic gait pattern  with decreased R arm swing.   TREATMENT DATE:  08/29/2023   Evaluation: -ROM measured, Strength assessed, HEP prescribed, pt educated on prognosis, findings, and importance of HEP compliance if given.  Therapeutic Exercise: -Supine bridges 2 sets of 10 reps, 3 second holds, symptomatic, pt cued for max hip extension -LTR, 1 set of 10 reps -STS, 1 set of 5 reps                                                                                                                                    PATIENT EDUCATION:  Education details: Pt was educated on findings of PT evaluation, prognosis, frequency of therapy visits and rationale, attendance policy, and HEP if given.   Person educated: Patient Education method: Explanation, Verbal cues, and Handouts Education comprehension: verbalized understanding and needs further education  HOME EXERCISE PROGRAM: Access Code: [email or web address] URL: [email or web address] Date: 08/29/2023 Prepared by: Armond Bertin  Exercises - Supine Bridge  - 1 x daily - 7 x weekly - 3 sets - 10 reps - Supine Lower Trunk Rotation  - 1 x daily - 7 x weekly - 3 sets - 10 reps - Sit to Stand with Arms Crossed  - 1 x daily - 7 x weekly - 3 sets - 10 reps  ASSESSMENT:  CLINICAL IMPRESSION: Patient is a 58 y.o. male who was seen today for physical therapy evaluation and treatment for M54.50,G89.29 (ICD-10-CM) - Chronic bilateral low back pain without sciatica.   Patient demonstrates low back pain with functional movements, decreased LE strength, and abnormal gait pattern. Patient also demonstrates antalgic gait pattern with ambulation during today's session with decreased arm swing noted and tightness in low back reported at the end of . Patient also demonstrates decreased LE strength of adductors and hip extensors specifically. Patient also demonstrates most pain with flexion AROM of lumbar spine and tenderness to palpation and mobilization to all four lumbar spinal segments as pts imaging reveals he only has L1-L4.  Patient would benefit from skilled physical therapy for decreased low back pain, increased endurance with ambulation, increased LE/core strength, and improved gait quality for return to higher level of function with ADLs, and progress towards therapy goals.   OBJECTIVE IMPAIRMENTS: Abnormal gait, decreased activity tolerance, decreased balance, decreased endurance, decreased ROM, decreased strength, and pain.   ACTIVITY LIMITATIONS: carrying, lifting, bending, standing, and squatting  PARTICIPATION LIMITATIONS: meal prep, cleaning, laundry, shopping, community activity, and yard work  PERSONAL FACTORS: Past/current experiences are also affecting patient's functional outcome.   REHAB POTENTIAL: Good  CLINICAL DECISION MAKING: Stable/uncomplicated  EVALUATION COMPLEXITY: Low   GOALS: Goals reviewed with patient? No  SHORT TERM GOALS: Target date: 09/12/23  Pt will be independent with HEP in order to demonstrate participation in Physical Therapy POC.  Baseline: Goal status: INITIAL  2.  Pt will report 4/10 pain at worst with mobility in order to demonstrate improved pain with ADLs.  Baseline:  6/10 Goal status: INITIAL  LONG TERM GOALS: Target date: 09/26/23  Pt will improve 5TSTS by at least 2.3 seconds in order to demonstrate improved functional strength to return to desired activities.  Baseline: see objective.  Goal status: INITIAL  2.  Pt will improve 2 MWT by 140 feet in order to demonstrate improved functional ambulatory capacity in community setting.  Baseline: see objective.  Goal status: INITIAL  3.  Pt will improve Modified Oswestry score by at least 6 points in order to demonstrate improved pain with functional goals and outcomes. Baseline: see objective.  Goal status: INITIAL  4.  Pt will report 2/10 pain at worst with mobility in order to demonstrate reduced pain with ADLs lasting greater than 30 minutes.  Baseline: see objective.  Goal status:  INITIAL   PLAN:  PT FREQUENCY: 2x/week  PT DURATION: 4 weeks  PLANNED INTERVENTIONS: 97110-Therapeutic exercises, 97530- Therapeutic activity, [id number]- Neuromuscular re-education, 97535- Self Care, [id number]- Manual therapy, [id number]- Gait training, Patient/Family education, Spinal mobilization, Cryotherapy, and Moist heat.  PLAN FOR NEXT SESSION: progress core/LE strengthening(extensor and adductors), review goals and HEP, HEP progression, STM to lumbar and thoracic para spinals (erector spinae muscle group)   Armond Bertin, PT, DPT [facility] Office: [phone] 10:36 AM, 08/29/23    Managed Medicaid Authorization Request  Visit Dx Codes: M54.59 ; Z74.09 ; R26.9   Functional Tool Score: Modified Oswestry 19/50   For all possible CPT codes, reference the Planned Interventions line above.     Check all conditions that are expected to impact treatment: {Conditions expected to impact treatment:Structural or anatomic abnormalities   If treatment provided at initial evaluation, no treatment charged due to lack of authorization.

## 2023-09-11 ENCOUNTER — Other Ambulatory Visit: Payer: Self-pay | Admitting: Family Medicine

## 2023-09-12 ENCOUNTER — Other Ambulatory Visit: Payer: Self-pay | Admitting: Family Medicine

## 2023-09-12 ENCOUNTER — Ambulatory Visit (HOSPITAL_COMMUNITY)

## 2023-09-12 DIAGNOSIS — E785 Hyperlipidemia, unspecified: Secondary | ICD-10-CM

## 2023-09-12 DIAGNOSIS — M5459 Other low back pain: Secondary | ICD-10-CM | POA: Diagnosis not present

## 2023-09-12 DIAGNOSIS — Z7409 Other reduced mobility: Secondary | ICD-10-CM

## 2023-09-12 NOTE — Therapy (Signed)
 OUTPATIENT PHYSICAL THERAPY THORACOLUMBAR TREATMENT   Patient Name: Xavier Daniels MRN: [id number] DOB:04-12-1966, 58 y.o., male Today's Date: 09/12/2023  END OF SESSION:  PT End of Session - 09/12/23 0804     Visit Number 2    Number of Visits 8    Date for PT Re-Evaluation 09/26/23    Authorization Type [place] MEDICAID HEALTHY BLUE    Authorization Time Period 5 visits from 5/16 to 7/14    Authorization - Visit Number 1    Authorization - Number of Visits 5    Progress Note Due on Visit 8    PT Start Time 0804    PT Stop Time 0844    PT Time Calculation (min) 40 min    Activity Tolerance Patient tolerated treatment well    Behavior During Therapy [facility] for tasks assessed/performed              Past Medical History:  Diagnosis Date   Arthritis    Colon cancer screening 10/20/2018   Diabetes mellitus without complication (HCC)    Gout    Gout    Hypertension    Substance abuse (HCC)    ETOH, 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, CLEAN FOR 13 YEARS   Past Surgical History:  Procedure Laterality Date   BIOPSY  04/20/2019   Procedure: BIOPSY;  Surgeon: Alyce Jubilee, MD;  [facility];  Service: Endoscopy;;  transverse polyp x1   COLONOSCOPY WITH PROPOFOL  N/A 04/20/2019   Procedure: COLONOSCOPY WITH PROPOFOL ;  Surgeon: Alyce Jubilee, MD;  [facility];  Service: Endoscopy;  Laterality: N/A;  9:30am-office rescheduled 04/20/19 @ 8:30am   POLYPECTOMY  04/20/2019   Procedure: POLYPECTOMY;  Surgeon: Alyce Jubilee, MD;  [facility];  Service: Endoscopy;;  transversex1, rectal polyp   TOTAL HIP ARTHROPLASTY  2007   due to injury for avascular necrosis.   Patient Active Problem List   Diagnosis Date Noted   Chronic pain of left knee 12/14/2019   Depression, major, single episode, mild (HCC) 12/14/2019   Left ankle pain 12/14/2019   T2DM (type 2 diabetes mellitus) (HCC) 09/10/2019   Essential hypertension 06/06/2014   Hyperlipidemia 06/06/2014   Gout  06/06/2014   Obesity 06/06/2014    PCP: Austine Lefort, MD  REFERRING PROVIDER: Austine Lefort, MD  REFERRING DIAG: M54.50,G89.29 (ICD-10-CM) - Chronic bilateral low back pain without sciatica  Rationale for Evaluation and Treatment: Rehabilitation  THERAPY DIAG:  Other low back pain  Decreased functional mobility and endurance  ONSET DATE: Couple weeks ago  SUBJECTIVE:  SUBJECTIVE STATEMENT: Patient reports he is feeling a little better; 7-8/10 pain this morning in his back; is getting used to his mattress PERTINENT HISTORY:  History of therapy for neck and shoulder region  PAIN:  Are you having pain? Yes: NPRS scale: 6/10 Pain location: low back central and both sides Pain description: aching pain, Aggravating factors: standing, bending and twisting Relieving factors: relaxing, laying flat, rubbing massaging low back  PRECAUTIONS: None  RED FLAGS: None   WEIGHT BEARING RESTRICTIONS: No  FALLS:  Has patient fallen in last 6 months? No  OCCUPATION: not working  PLOF: Independent  PATIENT GOALS: get back pain to be more tolerable, to not notice low back pain as much  NEXT MD VISIT: after therapy  OBJECTIVE:  Note: Objective measures were completed at Evaluation unless otherwise noted.  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 Lumbar alignment is normal. Vertebral body heights are maintained. Mild disc space narrowing at L3-L4 and L4-S1. Multilevel degenerative osteophytes. Moderate lower lumbar facet degenerative changes   IMPRESSION: 1. 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 2. Multilevel degenerative changes, greatest at the lower lumbar levels.  PATIENT SURVEYS:  Modified Oswestry 19/50   COGNITION: Overall cognitive status: Within functional limits for tasks assessed     SENSATION: WFL    POSTURE: No Significant postural limitations  PALPATION: Pt tender to palpation of lumbar spine and paraspinal musculature. Pt demonstrates abnormal tightness in erector spinae group even into thoracic region.  LUMBAR ROM:   AROM eval  Flexion 90 degrees, pain coming back up  Extension 9 degrees, pain but not as bad as forward  Right lateral flexion 25 degrees, pain   Left lateral flexion 30, little pain  Right rotation 75%  Left rotation 75%   (Blank rows = not tested)  LOWER EXTREMITY ROM:     Active  Right eval Left eval  Hip flexion    Hip extension    Hip abduction    Hip adduction    Hip internal rotation    Hip external rotation    Knee flexion    Knee extension    Ankle dorsiflexion    Ankle plantarflexion    Ankle inversion    Ankle eversion     (Blank rows = not tested)  LOWER EXTREMITY MMT:    MMT Right eval Left eval  Hip flexion 4 4-  Hip extension 4- 3+  Hip abduction 4 4-  Hip adduction 4- 3+  Hip internal rotation    Hip external rotation    Knee flexion    Knee extension    Ankle dorsiflexion    Ankle plantarflexion    Ankle inversion    Ankle eversion     (Blank rows = not tested)  LUMBAR SPECIAL TESTS:  Straight leg raise test: Negative and Slump test: Negative  FUNCTIONAL TESTS:  5 times sit to stand: 22.15 2 minute walk test: 420  GAIT: Distance walked: 420 feet Assistive device utilized: None Level of assistance: Complete Independence Comments: Pt demonstrates slight antalgic gait pattern with decreased R arm swing.   TREATMENT DATE:  09/12/23 Review of HEP and goals Sit to stand x 5 no UE assist Supine: LTR 3" hold each side x 10 Bridge 3" hold x 10 Nustep seat  12, UE arms 12 x 5' Standing: Heel raises 2 x 10 Slant board 5 x 20" Hip vectors 3" hold x 8 each Green theraband scapular retraction 2 x 10 Green theraband shoulder extension 2 x 10 Paloff press green theraband 2 x 10 each way    08/29/2023  Evaluation: -ROM measured, Strength assessed, HEP prescribed, pt educated on prognosis, findings, and importance of HEP compliance if given.  Therapeutic Exercise: -Supine bridges 2 sets of 10 reps, 3 second holds, symptomatic, pt cued for max hip extension -LTR, 1 set of 10 reps -STS, 1 set of 5 reps                                                                                                                                    PATIENT EDUCATION:  Education details: Pt was educated on findings of PT evaluation, prognosis, frequency of therapy visits and rationale, attendance policy, and HEP if given.   Person educated: Patient Education method: Explanation, Verbal cues, and Handouts Education comprehension: verbalized understanding and needs further education  HOME EXERCISE PROGRAM: Access Code: [email or web address] URL: [email or web address] Date: 08/29/2023 Prepared by: Armond Bertin  Exercises - Supine Bridge  - 1 x daily - 7 x weekly - 3 sets - 10 reps - Supine Lower Trunk Rotation  - 1 x daily - 7 x weekly - 3 sets - 10 reps - Sit to Stand with Arms Crossed  - 1 x daily - 7 x weekly - 3 sets - 10 reps  ASSESSMENT:  CLINICAL IMPRESSION: Today's session started with a review of HEP and goals.  Patient verbalizes agreement with set rehab goals.  Patient able to demonstrate back correct form with HEP. Progressed to standing exercise without issue today with focus on core and postural strengthening. Needed no seated rest breaks during treatment today.  Decreased pain to 5/10 at the end of treatment.  Patient will benefit from continued skilled therapy services to address deficits and promote return to optimal function.      Eval:Patient is a 58 y.o. male who was seen today for physical therapy evaluation and treatment for M54.50,G89.29 (ICD-10-CM) - Chronic bilateral low back pain without sciatica.   Patient demonstrates low back pain with functional movements, decreased LE strength, and abnormal gait pattern. Patient also demonstrates antalgic gait pattern with ambulation during today's session with decreased arm swing noted and tightness in low back reported at the end of . Patient also demonstrates decreased LE strength of adductors and hip extensors specifically. Patient also demonstrates most pain with flexion AROM of lumbar spine and tenderness to palpation and mobilization to all four lumbar spinal segments as pts imaging reveals he only has L1-L4. Patient would benefit from skilled physical therapy for decreased low back pain, increased endurance with ambulation, increased LE/core strength, and improved gait quality for return to higher level of function with ADLs, and progress towards therapy goals.   OBJECTIVE IMPAIRMENTS:  Abnormal gait, decreased activity tolerance, decreased balance, decreased endurance, decreased ROM, decreased strength, and pain.   ACTIVITY LIMITATIONS: carrying, lifting, bending, standing, and squatting  PARTICIPATION LIMITATIONS: meal prep, cleaning, laundry, shopping, community activity, and yard work  PERSONAL FACTORS: Past/current experiences are also affecting patient's functional outcome.   REHAB POTENTIAL: Good  CLINICAL DECISION MAKING: Stable/uncomplicated  EVALUATION COMPLEXITY: Low   GOALS: Goals reviewed with patient? No  SHORT TERM GOALS: Target date: 09/12/23  Pt will be independent with HEP in order to demonstrate participation in Physical Therapy POC.  Baseline: Goal status: IN PROGRESS  2.  Pt will report 4/10 pain at worst with mobility in order to demonstrate improved pain with ADLs.  Baseline: 6/10 Goal status: IN PROGRESS  LONG TERM GOALS:  Target date: 09/26/23  Pt will improve 5TSTS by at least 2.3 seconds in order to demonstrate improved functional strength to return to desired activities.  Baseline: see objective.  Goal status: IN PROGRESS  2.  Pt will improve 2 MWT by 140 feet in order to demonstrate improved functional ambulatory capacity in community setting.  Baseline: see objective.  Goal status: IN PROGRESS  3.  Pt will improve Modified Oswestry score by at least 6 points in order to demonstrate improved pain with functional goals and outcomes. Baseline: see objective.  Goal status: IN PROGRESS  4.  Pt will report 2/10 pain at worst with mobility in order to demonstrate reduced pain with ADLs lasting greater than 30 minutes.  Baseline: see objective.  Goal status: IN PROGRESS   PLAN:  PT FREQUENCY: 2x/week  PT DURATION: 4 weeks  PLANNED INTERVENTIONS: 97110-Therapeutic exercises, 97530- Therapeutic activity, [id number]- Neuromuscular re-education, 97535- Self Care, [id number]- Manual therapy, [phone]- Gait training, Patient/Family education, Spinal mobilization, Cryotherapy, and Moist heat.  PLAN FOR NEXT SESSION: progress core/LE strengthening(extensor and adductors), HEP progression, STM to lumbar and thoracic para spinals (erector spinae muscle group)   8:50 AM, 09/12/23 Rodriquez Thorner Small Shadie Sweatman MPT [place]  [phone] [phone]

## 2023-09-15 NOTE — Telephone Encounter (Signed)
 Requested Prescriptions  Pending Prescriptions Disp Refills   pravastatin  (PRAVACHOL ) 40 MG tablet [Pharmacy Med Name: PRAVASTATIN  SODIUM 40 MG TAB] 90 tablet 1    Sig: TAKE ONE (1) TABLET BY MOUTH EVERY DAY     Cardiovascular:  Antilipid - Statins Failed - 09/15/2023  8:08 AM      Failed - Lipid Panel in normal range within the last 12 months    Cholesterol  Date Value Ref Range Status  01/16/2023 166 <200 mg/dL Final   LDL Cholesterol (Calc)  Date Value Ref Range Status  01/16/2023 96 mg/dL (calc) Final    Comment:    Reference range: <100 . Desirable range <100 mg/dL for primary prevention;   <70 mg/dL for patients with CHD or diabetic patients  with > or = 2 CHD risk factors. Aaron Aas LDL-C is now calculated using the Martin-Hopkins  calculation, which is a validated novel method providing  better accuracy than the Friedewald equation in the  estimation of LDL-C.  Melinda Sprawls et al. Erroll Heard. 4098;119(14): 2061-2068  (http://education.[employer].com/faq/FAQ164)    HDL  Date Value Ref Range Status  01/16/2023 43 > OR = 40 mg/dL Final   Triglycerides  Date Value Ref Range Status  01/16/2023 178 (H) <150 mg/dL Final         Passed - Patient is not pregnant      Passed - Valid encounter within last 12 months    Recent Outpatient Visits           1 month ago Chronic bilateral low back pain without sciatica   [place] [facility] Austine Lefort, MD   3 months ago Acute midline low back pain without sciatica   [place] [facility] Austine Lefort, MD   7 months ago Type 2 diabetes mellitus with other specified complication, without long-term current use of insulin [facility])   [place] [facility] Austine Lefort, MD   8 months ago Type 2 diabetes mellitus with other specified complication, without long-term current use of insulin [facility])   [place] [facility] Pickard, Cisco Crest, MD   11 months  ago DDD (degenerative disc disease), cervical   [place] [facility] Pickard, Cisco Crest, MD

## 2023-09-17 ENCOUNTER — Ambulatory Visit (HOSPITAL_COMMUNITY): Attending: Family Medicine | Admitting: Physical Therapy

## 2023-09-17 DIAGNOSIS — Z7409 Other reduced mobility: Secondary | ICD-10-CM | POA: Insufficient documentation

## 2023-09-17 DIAGNOSIS — M5459 Other low back pain: Secondary | ICD-10-CM | POA: Insufficient documentation

## 2023-09-17 DIAGNOSIS — R269 Unspecified abnormalities of gait and mobility: Secondary | ICD-10-CM | POA: Diagnosis present

## 2023-09-17 NOTE — Therapy (Signed)
 OUTPATIENT PHYSICAL THERAPY THORACOLUMBAR TREATMENT   Patient Name: Xavier Daniels MRN: [id number] DOB:[birth year], 58 y.o., male Today's Date: 09/17/2023  END OF SESSION:  PT End of Session - 09/17/23 0853     Visit Number 3    Number of Visits 8    Date for PT Re-Evaluation 09/26/23    Authorization Type [place] MEDICAID HEALTHY BLUE    Authorization Time Period 5 visits from 5/16 to 7/14    Authorization - Number of Visits 5    Progress Note Due on Visit 8    PT Start Time 0850    PT Stop Time 0930    PT Time Calculation (min) 40 min    Activity Tolerance Patient tolerated treatment well    Behavior During Therapy [facility] for tasks assessed/performed              Past Medical History:  Diagnosis Date   Arthritis    Colon cancer screening 10/20/2018   Diabetes mellitus without complication (HCC)    Gout    Gout    Hypertension    Substance abuse (HCC)    ETOH, 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, CLEAN FOR 13 YEARS   Past Surgical History:  Procedure Laterality Date   BIOPSY  04/20/2019   Procedure: BIOPSY;  Surgeon: Alyce Jubilee, MD;  [facility];  Service: Endoscopy;;  transverse polyp x1   COLONOSCOPY WITH PROPOFOL  N/A 04/20/2019   Procedure: COLONOSCOPY WITH PROPOFOL ;  Surgeon: Alyce Jubilee, MD;  [facility];  Service: Endoscopy;  Laterality: N/A;  9:30am-office rescheduled 04/20/19 @ 8:30am   POLYPECTOMY  04/20/2019   Procedure: POLYPECTOMY;  Surgeon: Alyce Jubilee, MD;  [facility];  Service: Endoscopy;;  transversex1, rectal polyp   TOTAL HIP ARTHROPLASTY  2007   due to injury for avascular necrosis.   Patient Active Problem List   Diagnosis Date Noted   Chronic pain of left knee 12/14/2019   Depression, major, single episode, mild (HCC) 12/14/2019   Left ankle pain 12/14/2019   T2DM (type 2 diabetes mellitus) (HCC) 09/10/2019   Essential hypertension 06/06/2014   Hyperlipidemia 06/06/2014   Gout 06/06/2014   Obesity 06/06/2014    PCP:  Austine Lefort, MD  REFERRING PROVIDER: Austine Lefort, MD  REFERRING DIAG: M54.50,G89.29 (ICD-10-CM) - Chronic bilateral low back pain without sciatica  Rationale for Evaluation and Treatment: Rehabilitation  THERAPY DIAG:  Other low back pain  Decreased functional mobility and endurance  Abnormal gait  ONSET DATE: Couple weeks ago  SUBJECTIVE:  SUBJECTIVE STATEMENT: Patient states his pain remains around 7-8/10.     PERTINENT HISTORY:  History of therapy for neck and shoulder region  PAIN:  Are you having pain? Yes: NPRS scale: 6/10 Pain location: low back central and both sides Pain description: aching pain, Aggravating factors: standing, bending and twisting Relieving factors: relaxing, laying flat, rubbing massaging low back  PRECAUTIONS: None  RED FLAGS: None   WEIGHT BEARING RESTRICTIONS: No  FALLS:  Has patient fallen in last 6 months? No  OCCUPATION: not working  PLOF: Independent  PATIENT GOALS: get back pain to be more tolerable, to not notice low back pain as much  NEXT MD VISIT: after therapy  OBJECTIVE:  Note: Objective measures were completed at Evaluation unless otherwise noted.  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 Lumbar alignment is normal. Vertebral body heights are maintained. Mild disc space narrowing at L3-L4 and L4-S1. Multilevel degenerative osteophytes. Moderate lower lumbar facet degenerative changes   IMPRESSION: 1. 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 2. Multilevel degenerative changes, greatest at the lower  lumbar levels.  PATIENT SURVEYS:  Modified Oswestry 19/50   COGNITION: Overall cognitive status: Within functional limits for tasks assessed     SENSATION: WFL    POSTURE: No Significant postural limitations  PALPATION: Pt tender to palpation of lumbar spine and paraspinal musculature. Pt demonstrates abnormal tightness in erector spinae group even into thoracic region.  LUMBAR ROM:   AROM eval  Flexion 90 degrees, pain coming back up  Extension 9 degrees, pain but not as bad as forward  Right lateral flexion 25 degrees, pain   Left lateral flexion 30, little pain  Right rotation 75%  Left rotation 75%   (Blank rows = not tested)  LOWER EXTREMITY ROM:     Active  Right eval Left eval  Hip flexion    Hip extension    Hip abduction    Hip adduction    Hip internal rotation    Hip external rotation    Knee flexion    Knee extension    Ankle dorsiflexion    Ankle plantarflexion    Ankle inversion    Ankle eversion     (Blank rows = not tested)  LOWER EXTREMITY MMT:    MMT Right eval Left eval  Hip flexion 4 4-  Hip extension 4- 3+  Hip abduction 4 4-  Hip adduction 4- 3+  Hip internal rotation    Hip external rotation    Knee flexion    Knee extension    Ankle dorsiflexion    Ankle plantarflexion    Ankle inversion    Ankle eversion     (Blank rows = not tested)  LUMBAR SPECIAL TESTS:  Straight leg raise test: Negative and Slump test: Negative  FUNCTIONAL TESTS:  5 times sit to stand: 22.15 2 minute walk test: 420  GAIT: Distance walked: 420 feet Assistive device utilized: None Level of assistance: Complete Independence Comments: Pt demonstrates slight antalgic gait pattern with decreased R arm swing.   TREATMENT DATE:  09/17/23 Standing:  hip excursions 3 motions 10X each  Heelraises on incline 20X  Slant board 5X30"   Hip vectors 10X5" each with 1 UE assist Postural Green theraband scap retraction 2X10  Green theraband shoulder  extension  2X10  Pallof green theraband 2X10   09/12/23 Review of HEP and goals Sit to stand x 5 no UE assist Supine: LTR 3" hold each side x 10 Bridge 3" hold x 10 Nustep seat 12, UE arms 12 x 5' Standing: Heel raises 2 x 10 Slant board 5 x 20" Hip vectors 3" hold x 8 each Green theraband scapular retraction 2 x 10 Green theraband shoulder extension 2 x 10 Paloff press green theraband 2 x 10 each way    08/29/2023  Evaluation: -ROM measured, Strength assessed, HEP prescribed, pt educated on prognosis, findings, and importance of HEP compliance if given.  Therapeutic Exercise: -Supine bridges 2 sets of 10 reps, 3 second holds, symptomatic, pt cued for max hip extension -LTR, 1 set of 10 reps -STS, 1 set of 5 reps                                                                                                                                    PATIENT EDUCATION:  Education details: Pt was educated on findings of PT evaluation, prognosis, frequency of therapy visits and rationale, attendance policy, and HEP if given.   Person educated: Patient Education method: Explanation, Verbal cues, and Handouts Education comprehension: verbalized understanding and needs further education  HOME EXERCISE PROGRAM: Access Code: [email or web address] URL: [email or web address] Date: 08/29/2023 Prepared by: Armond Bertin  Exercises - Supine Bridge  - 1 x daily - 7 x weekly - 3 sets - 10 reps - Supine Lower Trunk Rotation  - 1 x daily - 7 x weekly - 3 sets - 10 reps - Sit to Stand with Arms Crossed  - 1 x daily - 7 x weekly - 3 sets - 10 reps  ASSESSMENT:  CLINICAL IMPRESSION: Continued with focus on improving core stability and strength.  Completed all exercises in standing with cues to maintain posture and stabilize.  PT without complaints of pain or noted pain behaviors with exercises today.  Pt requires no seated or standing rest breaks during treatment today. Patient will continue  to benefit from skilled therapy.     Eval:Patient is a 58 y.o. male who was seen today for physical therapy evaluation and treatment for M54.50,G89.29 (ICD-10-CM) - Chronic bilateral low back pain without sciatica.   Patient demonstrates low back pain with functional movements, decreased LE strength, and abnormal gait pattern. Patient also demonstrates antalgic gait pattern with ambulation during today's session with decreased arm swing noted and tightness in low back reported at the end of . Patient also demonstrates decreased LE strength of adductors and hip extensors specifically. Patient also demonstrates most pain with flexion AROM of lumbar spine and tenderness to palpation and mobilization to all four lumbar spinal segments as pts imaging reveals he only has L1-L4. Patient would benefit from skilled physical therapy for decreased low back pain, increased endurance with ambulation, increased LE/core strength, and improved gait quality for return to higher  level of function with ADLs, and progress towards therapy goals.   OBJECTIVE IMPAIRMENTS: Abnormal gait, decreased activity tolerance, decreased balance, decreased endurance, decreased ROM, decreased strength, and pain.   ACTIVITY LIMITATIONS: carrying, lifting, bending, standing, and squatting  PARTICIPATION LIMITATIONS: meal prep, cleaning, laundry, shopping, community activity, and yard work  PERSONAL FACTORS: Past/current experiences are also affecting patient's functional outcome.   REHAB POTENTIAL: Good  CLINICAL DECISION MAKING: Stable/uncomplicated  EVALUATION COMPLEXITY: Low   GOALS: Goals reviewed with patient? No  SHORT TERM GOALS: Target date: 09/12/23  Pt will be independent with HEP in order to demonstrate participation in Physical Therapy POC.  Baseline: Goal status: IN PROGRESS  2.  Pt will report 4/10 pain at worst with mobility in order to demonstrate improved pain with ADLs.  Baseline: 6/10 Goal status:  IN PROGRESS  LONG TERM GOALS: Target date: 09/26/23  Pt will improve 5TSTS by at least 2.3 seconds in order to demonstrate improved functional strength to return to desired activities.  Baseline: see objective.  Goal status: IN PROGRESS  2.  Pt will improve 2 MWT by 140 feet in order to demonstrate improved functional ambulatory capacity in community setting.  Baseline: see objective.  Goal status: IN PROGRESS  3.  Pt will improve Modified Oswestry score by at least 6 points in order to demonstrate improved pain with functional goals and outcomes. Baseline: see objective.  Goal status: IN PROGRESS  4.  Pt will report 2/10 pain at worst with mobility in order to demonstrate reduced pain with ADLs lasting greater than 30 minutes.  Baseline: see objective.  Goal status: IN PROGRESS   PLAN:  PT FREQUENCY: 2x/week  PT DURATION: 4 weeks  PLANNED INTERVENTIONS: 97110-Therapeutic exercises, 97530- Therapeutic activity, [id number]- Neuromuscular re-education, 97535- Self Care, 16109- Manual therapy, [phone]- Gait training, Patient/Family education, Spinal mobilization, Cryotherapy, and Moist heat.  PLAN FOR NEXT SESSION: progress core/LE strengthening(extensor and adductors), HEP progression.   12:09 PM, 09/17/23 Lorenso Romance, PTA/CLT [facility] Ph: [phone]

## 2023-09-18 ENCOUNTER — Other Ambulatory Visit: Payer: Self-pay

## 2023-09-18 ENCOUNTER — Ambulatory Visit
Admission: EM | Admit: 2023-09-18 | Discharge: 2023-09-18 | Disposition: A | Discharge status: Home / Self Care | Attending: Nurse Practitioner | Admitting: Nurse Practitioner

## 2023-09-18 ENCOUNTER — Encounter: Payer: Self-pay | Admitting: Emergency Medicine

## 2023-09-18 DIAGNOSIS — G8929 Other chronic pain: Secondary | ICD-10-CM | POA: Diagnosis not present

## 2023-09-18 DIAGNOSIS — M545 Low back pain, unspecified: Secondary | ICD-10-CM

## 2023-09-18 MED ORDER — KETOROLAC TROMETHAMINE 30 MG/ML IJ SOLN
30.0000 mg | Freq: Once | INTRAMUSCULAR | Status: AC
  Administered 2023-09-18: 30 mg via INTRAMUSCULAR

## 2023-09-18 NOTE — ED Triage Notes (Signed)
 Pt reports is currently undergoing physical therapy, most recent visit yesterday for back, and reports increase in pain since this am. Denies any known injury.

## 2023-09-18 NOTE — ED Provider Notes (Signed)
 [facility]    CSN: [id number] Arrival date & time: 09/18/23  0803      History   Chief Complaint Chief Complaint  Patient presents with   Back Pain    HPI Xavier Daniels is a 58 y.o. male.   Patient presents today for 1 day history of acutely worsening chronic low back pain.  Reports yesterday, he attended physical therapy and wonders if he overdid it.  Reports pain this morning when he got up from the table after reading his Bible.  He denies numbness or tingling shooting down the leg, pain radiating down the legs, weakness or decreased sensation of lower extremities, burning with urination, increased urinary frequency, hematuria, saddle anesthesia, no bowel or bladder incontinence.  No injury or fall yesterday.  He reports that pain is in the same spot as his chronic pain.  He has not take anything for the pain so far.    Past Medical History:  Diagnosis Date   Arthritis    Colon cancer screening 10/20/2018   Diabetes mellitus without complication (HCC)    Gout    Gout    Hypertension    Substance abuse (HCC)    ETOH, 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, CLEAN FOR 13 YEARS    Patient Active Problem List   Diagnosis Date Noted   Chronic pain of left knee 12/14/2019   Depression, major, single episode, mild (HCC) 12/14/2019   Left ankle pain 12/14/2019   T2DM (type 2 diabetes mellitus) (HCC) 09/10/2019   Essential hypertension 06/06/2014   Hyperlipidemia 06/06/2014   Gout 06/06/2014   Obesity 06/06/2014    Past Surgical History:  Procedure Laterality Date   BIOPSY  04/20/2019   Procedure: BIOPSY;  Surgeon: Alyce Jubilee, MD;  [facility];  Service: Endoscopy;;  transverse polyp x1   COLONOSCOPY WITH PROPOFOL  N/A 04/20/2019   Procedure: COLONOSCOPY WITH PROPOFOL ;  Surgeon: Alyce Jubilee, MD;  [facility]: [facility];  Service: Endoscopy;  Laterality: N/A;  9:30am-office rescheduled 04/20/19 @ 8:30am   POLYPECTOMY  04/20/2019   Procedure: POLYPECTOMY;  Surgeon:  Alyce Jubilee, MD;  [facility]: [facility];  Service: Endoscopy;;  transversex1, rectal polyp   TOTAL HIP ARTHROPLASTY  2007   due to injury for avascular necrosis.       Home Medications    Prior to Admission medications   Medication Sig Start Date End Date Taking? Authorizing Provider  allopurinol  (ZYLOPRIM ) 300 MG tablet TAKE ONE (1) TABLET BY MOUTH EVERY DAY 07/25/23   Austine Lefort, MD  amLODipine  (NORVASC ) 10 MG tablet TAKE ONE (1) TABLET BY MOUTH EVERY DAY THIS REPLACES ATENOLOL  02/12/23   Austine Lefort, MD  HYDROcodone -acetaminophen  (NORCO) 7.5-325 MG tablet Take 1 tablet by mouth every 6 (six) hours as needed for moderate pain (pain score 4-6). 09/12/23   Austine Lefort, MD  ibuprofen  (ADVIL ) 800 MG tablet TAKE ONE TABLET BY MOUTH EVERY EIGHT HOURS AS NEEDED 07/03/22   Austine Lefort, MD  JARDIANCE  25 MG TABS tablet TAKE ONE (1) TABLET BY MOUTH EVERY DAY BEFORE BREAKFAST 02/12/23   Austine Lefort, MD  levocetirizine (XYZAL  ALLERGY 24HR) 5 MG tablet Take 1 tablet (5 mg total) by mouth every evening. 09/03/21   Austine Lefort, MD  lidocaine  (LIDODERM ) 5 % Place 1 patch onto the skin daily. Remove & Discard patch within 12 hours or as directed by MD 09/19/22   Ballard Bongo, MD  losartan  (COZAAR ) 100 MG tablet  TAKE ONE (1) TABLET BY MOUTH EVERY DAY 04/21/23   Austine Lefort, MD  meloxicam  (MOBIC ) 15 MG tablet Take 1 tablet (15 mg total) by mouth daily. 07/31/23   Austine Lefort, MD  ondansetron  (ZOFRAN -ODT) 4 MG disintegrating tablet Take 1 tablet (4 mg total) by mouth every 8 (eight) hours as needed for nausea or vomiting. 07/25/23   Tonya Fredrickson, MD  OZEMPIC , 0.25 OR 0.5 MG/DOSE, 2 MG/3ML SOPN INJECT 0.5MG  INTO THE SKIN ONCE A WEEK 07/25/23   Austine Lefort, MD  pravastatin  (PRAVACHOL ) 40 MG tablet TAKE ONE (1) TABLET BY MOUTH EVERY DAY 09/15/23   Austine Lefort, MD  RESTASIS 0.05 % ophthalmic emulsion Place 1 drop into both eyes 2 (two) times  daily. 05/08/23   [provider]    Family History Family History  Problem Relation Age of Onset   Diabetes Mother    Diabetes Brother     Social History Social History   Tobacco Use   Smoking status: Never   Smokeless tobacco: Never  Vaping Use   Vaping status: Never Used  Substance Use Topics   Alcohol use: No    Comment: previous heavy use, quit 2004   Drug use: No    Comment: previous marijuana use     Allergies   Metformin  and related   Review of Systems Review of Systems Per HPI  Physical Exam Triage Vital Signs ED Triage Vitals  Encounter Vitals Group     BP 09/18/23 0817 123/87     Systolic BP Percentile --      Diastolic BP Percentile --      Pulse Rate 09/18/23 0817 83     Resp 09/18/23 0817 18     Temp 09/18/23 0817 97.9 F (36.6 C)     Temp Source 09/18/23 0817 Oral     SpO2 09/18/23 0817 98 %     Weight --      Height --      Head Circumference --      Peak Flow --      Pain Score 09/18/23 0816 8     Pain Loc --      Pain Education --      Exclude from Growth Chart --    No data found.  Updated Vital Signs BP 123/87 (BP Location: Right Arm)   Pulse 83   Temp 97.9 F (36.6 C) (Oral)   Resp 18   SpO2 98%   Visual Acuity Right Eye Distance:   Left Eye Distance:   Bilateral Distance:    Right Eye Near:   Left Eye Near:    Bilateral Near:     Physical Exam Vitals and nursing note reviewed.  Constitutional:      General: He is not in acute distress.    Appearance: Normal appearance. He is not toxic-appearing.  HENT:     Head: Normocephalic and atraumatic.     Mouth/Throat:     Mouth: Mucous membranes are moist.     Pharynx: Oropharynx is clear.  Pulmonary:     Effort: Pulmonary effort is normal. No respiratory distress.  Abdominal:     Tenderness: There is no right CVA tenderness or left CVA tenderness.  Musculoskeletal:     Lumbar back: Negative right straight leg raise test and negative left straight leg raise  test.       Back:     Right lower leg: No edema.     Left lower leg:  No edema.     Comments: Pain to area marked; no bruising/swelling, obvious deformity.  Nontender to touch.  Bilateral lower extremities are equal in strength and sensation and neurovascularly intact distally.  Skin:    General: Skin is warm and dry.     Capillary Refill: Capillary refill takes less than 2 seconds.     Coloration: Skin is not jaundiced or pale.     Findings: No erythema.  Neurological:     Mental Status: He is alert and oriented to person, place, and time.     Motor: No weakness.     Gait: Gait normal.  Psychiatric:        Behavior: Behavior is cooperative.      UC Treatments / Results  Labs (all labs ordered are listed, but only abnormal results are displayed) Labs Reviewed - No data to display  EKG   Radiology No results found.  Procedures Procedures (including critical care time)  Medications Ordered in UC Medications  ketorolac  (TORADOL ) 30 MG/ML injection 30 mg (has no administration in time range)    Initial Impression / Assessment and Plan / UC Course  I have reviewed the triage vital signs and the nursing notes.  Pertinent labs & imaging results that were available during my care of the patient were reviewed by me and considered in my medical decision making (see chart for details).   Patient is well-appearing, normotensive, afebrile, not tachycardic, not tachypneic, oxygenating well on room air.   1. Chronic left-sided low back pain without sciatica No red flags Exam is reassuring today Suspect possible mild strain/muscle fatigue from physical therapy yesterday Pain treated with Toradol  30 mg IM in urgent care; recent GFR normal Supportive care discussed at home including ice, back exercises, continue with physical therapy Return and ER precautions discussed with patient  The patient was given the opportunity to ask questions.  All questions answered to their  satisfaction.  The patient is in agreement to this plan.   Final Clinical Impressions(s) / UC Diagnoses   Final diagnoses:  Chronic left-sided low back pain without sciatica     Discharge Instructions      We have given you an injection of NSAID medication today called Toradol  to help with pain.  Do no take other NSAIDs for 48 hours.  Recommend cool compresses for 15 minutes every hour while awake today.  Recommend continuing PT for low back pain.  If symptoms do not improve or worsen with treatment, follow up with PCP.   ED Prescriptions   None    PDMP not reviewed this encounter.   Wilhemena Harbour, NP 09/18/23 [phone]

## 2023-09-18 NOTE — Discharge Instructions (Addendum)
 We have given you an injection of NSAID medication today called Toradol  to help with pain.  Do no take other NSAIDs for 48 hours.  Recommend cool compresses for 15 minutes every hour while awake today.  Recommend continuing PT for low back pain.  If symptoms do not improve or worsen with treatment, follow up with PCP.

## 2023-09-19 ENCOUNTER — Ambulatory Visit (HOSPITAL_COMMUNITY)

## 2023-09-19 ENCOUNTER — Encounter (HOSPITAL_COMMUNITY): Payer: Self-pay

## 2023-09-19 DIAGNOSIS — M5459 Other low back pain: Secondary | ICD-10-CM

## 2023-09-19 DIAGNOSIS — Z7409 Other reduced mobility: Secondary | ICD-10-CM

## 2023-09-19 DIAGNOSIS — R269 Unspecified abnormalities of gait and mobility: Secondary | ICD-10-CM

## 2023-09-19 NOTE — Therapy (Signed)
 [facility] at [facility] [address] [place], [place], [address] Phone: [phone]   Fax:  [phone]  Patient Details  Name: Xavier Daniels MRN: [id number] Date of Birth: Jun 08, 1965 Referring Provider:  Austine Lefort, MD  Encounter Date: 09/19/2023  Pt states pain was excruciating starting yesterday morning about 6 AM and went to Urgent Care and received one shot of Toradol . Pt reports he does not want to do any exercise today due to pain being a 10/10. Pt states he was going to cancel but did not want to miss a therapy appointment. Pt states he thinks he will be able to return to activity during next week's appointment on Wednesday morning after resting a few more days.  Armond Bertin, PT, DPT [facility] Office: [phone] 9:15 AM, 09/19/23  [facility] at [facility] [address] [place], [place], [address] Phone: [phone]   Fax:  [phone]

## 2023-09-24 ENCOUNTER — Ambulatory Visit (HOSPITAL_COMMUNITY): Admitting: Physical Therapy

## 2023-09-24 DIAGNOSIS — R269 Unspecified abnormalities of gait and mobility: Secondary | ICD-10-CM

## 2023-09-24 DIAGNOSIS — M5459 Other low back pain: Secondary | ICD-10-CM

## 2023-09-24 DIAGNOSIS — Z7409 Other reduced mobility: Secondary | ICD-10-CM

## 2023-09-24 NOTE — Therapy (Signed)
 OUTPATIENT PHYSICAL THERAPY THORACOLUMBAR TREATMENT   Patient Name: Xavier Daniels MRN: [id number] DOB:1965/10/17, 58 y.o., male Today's Date: 09/24/2023  END OF SESSION:  PT End of Session - 09/24/23 1344     Visit Number 5    Number of Visits 8    Date for PT Re-Evaluation 09/26/23    Authorization Type [place] MEDICAID HEALTHY BLUE    Authorization Time Period 5 visits from 5/16 to 7/14 (no billed charges on 6/6, visit 3)    Authorization - Visit Number 4    Authorization - Number of Visits 5    Progress Note Due on Visit 8    PT Start Time 0850    PT Stop Time 0930    PT Time Calculation (min) 40 min    Activity Tolerance Patient limited by pain               Past Medical History:  Diagnosis Date   Arthritis    Colon cancer screening 10/20/2018   Diabetes mellitus without complication (HCC)    Gout    Gout    Hypertension    Substance abuse (HCC)    ETOH, 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, CLEAN FOR 13 YEARS   Past Surgical History:  Procedure Laterality Date   BIOPSY  04/20/2019   Procedure: BIOPSY;  Surgeon: Alyce Jubilee, MD;  [facility];  Service: Endoscopy;;  transverse polyp x1   COLONOSCOPY WITH PROPOFOL  N/A 04/20/2019   Procedure: COLONOSCOPY WITH PROPOFOL ;  Surgeon: Alyce Jubilee, MD;  [facility];  Service: Endoscopy;  Laterality: N/A;  9:30am-office rescheduled 04/20/19 @ 8:30am   POLYPECTOMY  04/20/2019   Procedure: POLYPECTOMY;  Surgeon: Alyce Jubilee, MD;  [facility];  Service: Endoscopy;;  transversex1, rectal polyp   TOTAL HIP ARTHROPLASTY  2007   due to injury for avascular necrosis.   Patient Active Problem List   Diagnosis Date Noted   Chronic pain of left knee 12/14/2019   Depression, major, single episode, mild (HCC) 12/14/2019   Left ankle pain 12/14/2019   T2DM (type 2 diabetes mellitus) (HCC) 09/10/2019   Essential hypertension 06/06/2014   Hyperlipidemia 06/06/2014   Gout 06/06/2014   Obesity 06/06/2014     PCP: Austine Lefort, MD  REFERRING PROVIDER: Austine Lefort, MD  REFERRING DIAG: M54.50,G89.29 (ICD-10-CM) - Chronic bilateral low back pain without sciatica  Rationale for Evaluation and Treatment: Rehabilitation  THERAPY DIAG:  Other low back pain  Decreased functional mobility and endurance  Abnormal gait  ONSET DATE: Couple weeks ago  SUBJECTIVE:  SUBJECTIVE STATEMENT: Patient states the morning after his last appt he had to go to urgent care due to his pain being so high and inability to walk.  States they gave him a shot of Toradol  and eventually started feeling better.  Currently his pain 5-6/10.     PERTINENT HISTORY:  History of therapy for neck and shoulder region  PAIN:  Are you having pain? Yes: NPRS scale: 6/10 Pain location: low back central and both sides Pain description: aching pain, Aggravating factors: standing, bending and twisting Relieving factors: relaxing, laying flat, rubbing massaging low back  PRECAUTIONS: None  RED FLAGS: None   WEIGHT BEARING RESTRICTIONS: No  FALLS:  Has patient fallen in last 6 months? No  OCCUPATION: not working  PLOF: Independent  PATIENT GOALS: get back pain to be more tolerable, to not notice low back pain as much  NEXT MD VISIT: after therapy  OBJECTIVE:  Note: Objective measures were completed at Evaluation unless otherwise noted.  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 Lumbar alignment is normal. Vertebral body heights are maintained. Mild disc space narrowing at L3-L4 and L4-S1. Multilevel degenerative  osteophytes. Moderate lower lumbar facet degenerative changes   IMPRESSION: 1. 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 2. Multilevel degenerative changes, greatest at the lower lumbar levels.  PATIENT SURVEYS:  Modified Oswestry 19/50   COGNITION: Overall cognitive status: Within functional limits for tasks assessed     SENSATION: WFL    POSTURE: No Significant postural limitations  PALPATION: Pt tender to palpation of lumbar spine and paraspinal musculature. Pt demonstrates abnormal tightness in erector spinae group even into thoracic region.  LUMBAR ROM:   AROM eval  Flexion 90 degrees, pain coming back up  Extension 9 degrees, pain but not as bad as forward  Right lateral flexion 25 degrees, pain   Left lateral flexion 30, little pain  Right rotation 75%  Left rotation 75%   (Blank rows = not tested)   LOWER EXTREMITY MMT:    MMT Right eval Left eval  Hip flexion 4 4-  Hip extension 4- 3+  Hip abduction 4 4-  Hip adduction 4- 3+  Hip internal rotation    Hip external rotation    Knee flexion    Knee extension    Ankle dorsiflexion    Ankle plantarflexion    Ankle inversion    Ankle eversion     (Blank rows = not tested)  LUMBAR SPECIAL TESTS:  Straight leg raise test: Negative and Slump test: Negative  FUNCTIONAL TESTS:  5 times sit to stand: 22.15 2 minute walk test: 420  GAIT: Distance walked: 420 feet Assistive device utilized: None Level of assistance: Complete Independence Comments: Pt demonstrates slight antalgic gait pattern with decreased R arm swing.   TREATMENT DATE:  09/24/23 Standing:  hip excursions 3 motions 10X each  Heelraises on incline 20X  Slant board 5X30   Hip vectors 10X5 each with 1 UE assist Postural Green theraband scap retraction 2X10  Green theraband shoulder extension 2X10  Pallof green theraband 2X10   09/17/23 Standing:  hip excursions 3 motions 10X each  Heelraises on incline  20X  Slant board 5X30   Hip vectors 10X5 each with 1 UE assist Postural Green theraband scap retraction 2X10  Green  theraband shoulder extension 2X10  Pallof green theraband 2X10   09/12/23 Review of HEP and goals Sit to stand x 5 no UE assist Supine: LTR 3 hold each side x 10 Bridge 3 hold x 10 Nustep seat 12, UE arms 12 x 5' Standing: Heel raises 2 x 10 Slant board 5 x 20 Hip vectors 3 hold x 8 each Green theraband scapular retraction 2 x 10 Green theraband shoulder extension 2 x 10 Paloff press green theraband 2 x 10 each way    08/29/2023  Evaluation: -ROM measured, Strength assessed, HEP prescribed, pt educated on prognosis, findings, and importance of HEP compliance if given.  Therapeutic Exercise: -Supine bridges 2 sets of 10 reps, 3 second holds, symptomatic, pt cued for max hip extension -LTR, 1 set of 10 reps -STS, 1 set of 5 reps                                                                                                                                    PATIENT EDUCATION:  Education details: Pt was educated on findings of PT evaluation, prognosis, frequency of therapy visits and rationale, attendance policy, and HEP if given.   Person educated: Patient Education method: Explanation, Verbal cues, and Handouts Education comprehension: verbalized understanding and needs further education  HOME EXERCISE PROGRAM: Access Code: [email or web address] URL: [email or web address] Date: 08/29/2023 Prepared by: Armond Bertin  Exercises - Supine Bridge  - 1 x daily - 7 x weekly - 3 sets - 10 reps - Supine Lower Trunk Rotation  - 1 x daily - 7 x weekly - 3 sets - 10 reps - Sit to Stand with Arms Crossed  - 1 x daily - 7 x weekly - 3 sets - 10 reps  ASSESSMENT:  CLINICAL IMPRESSION: Discussed last treatment session/reviewed without known cause as to what activity could have increased symptoms.  PT denied any increased activity that evening or cause for  pain.  Continued with focus on improving core stability and strength.  No change in current session/increase reps, etc due to exacerbated symptoms after last session.  Wanting to see if symptoms increase again after this session or if unchanged/improved.   Cues still needed this session  to maintain posture and stabilize with activities.  PT without complaints of pain or noted pain behaviors with exercises today, remaining jovial throughout session.  Pt requires no seated or standing rest breaks during treatment today. Patient will continue to benefit from skilled therapy.     Eval:Patient is a 58 y.o. male who was seen today for physical therapy evaluation and treatment for M54.50,G89.29 (ICD-10-CM) - Chronic bilateral low back pain without sciatica.   Patient demonstrates low back pain with functional movements, decreased LE strength, and abnormal gait pattern. Patient also demonstrates antalgic gait pattern with ambulation during today's session with decreased arm swing noted and tightness in low back reported at the end of . Patient also demonstrates decreased  LE strength of adductors and hip extensors specifically. Patient also demonstrates most pain with flexion AROM of lumbar spine and tenderness to palpation and mobilization to all four lumbar spinal segments as pts imaging reveals he only has L1-L4. Patient would benefit from skilled physical therapy for decreased low back pain, increased endurance with ambulation, increased LE/core strength, and improved gait quality for return to higher level of function with ADLs, and progress towards therapy goals.   OBJECTIVE IMPAIRMENTS: Abnormal gait, decreased activity tolerance, decreased balance, decreased endurance, decreased ROM, decreased strength, and pain.   ACTIVITY LIMITATIONS: carrying, lifting, bending, standing, and squatting  PARTICIPATION LIMITATIONS: meal prep, cleaning, laundry, shopping, community activity, and yard work  PERSONAL  FACTORS: Past/current experiences are also affecting patient's functional outcome.   REHAB POTENTIAL: Good  CLINICAL DECISION MAKING: Stable/uncomplicated  EVALUATION COMPLEXITY: Low   GOALS: Goals reviewed with patient? No  SHORT TERM GOALS: Target date: 09/12/23  Pt will be independent with HEP in order to demonstrate participation in Physical Therapy POC.  Baseline: Goal status: IN PROGRESS  2.  Pt will report 4/10 pain at worst with mobility in order to demonstrate improved pain with ADLs.  Baseline: 6/10 Goal status: IN PROGRESS  LONG TERM GOALS: Target date: 09/26/23  Pt will improve 5TSTS by at least 2.3 seconds in order to demonstrate improved functional strength to return to desired activities.  Baseline: see objective.  Goal status: IN PROGRESS  2.  Pt will improve 2 MWT by 140 feet in order to demonstrate improved functional ambulatory capacity in community setting.  Baseline: see objective.  Goal status: IN PROGRESS  3.  Pt will improve Modified Oswestry score by at least 6 points in order to demonstrate improved pain with functional goals and outcomes. Baseline: see objective.  Goal status: IN PROGRESS  4.  Pt will report 2/10 pain at worst with mobility in order to demonstrate reduced pain with ADLs lasting greater than 30 minutes.  Baseline: see objective.  Goal status: IN PROGRESS   PLAN:  PT FREQUENCY: 2x/week  PT DURATION: 4 weeks  PLANNED INTERVENTIONS: 97110-Therapeutic exercises, 97530- Therapeutic activity, [id number]- Neuromuscular re-education, 97535- Self Care, 16109- Manual therapy, [id number]- Gait training, Patient/Family education, Spinal mobilization, Cryotherapy, and Moist heat.  PLAN FOR NEXT SESSION: progress core/LE strengthening(extensor and adductors), HEP progression.  Complete reassessment next session.   1:46 PM, 09/24/23 Lorenso Romance, PTA/CLT [facility] Ph: [phone]

## 2023-09-26 ENCOUNTER — Ambulatory Visit (HOSPITAL_COMMUNITY)

## 2023-09-26 ENCOUNTER — Encounter (HOSPITAL_COMMUNITY): Payer: Self-pay

## 2023-09-26 DIAGNOSIS — R269 Unspecified abnormalities of gait and mobility: Secondary | ICD-10-CM

## 2023-09-26 DIAGNOSIS — Z7409 Other reduced mobility: Secondary | ICD-10-CM

## 2023-09-26 DIAGNOSIS — M5459 Other low back pain: Secondary | ICD-10-CM

## 2023-09-26 NOTE — Therapy (Signed)
 OUTPATIENT PHYSICAL THERAPY THORACOLUMBAR TREATMENT Progress Note Reporting Period 08/29/23 to 09/26/23  See note below for Objective Data and Assessment of Progress/Goals.      Patient Name: Xavier Daniels MRN: [id number] DOB:[birth year], 58 y.o., male Today's Date: 09/26/2023  END OF SESSION:  PT End of Session - 09/26/23 0933     Visit Number 6    Number of Visits 8    Date for PT Re-Evaluation 09/26/23    Authorization Type [place] MEDICAID HE[employer]Y BLUE    Authorization Time Period 5 visits from 5/16 to 7/14 (no billed charges on 6/6, visit 3)    Authorization - Visit Number 5    Authorization - Number of Visits 5    Progress Note Due on Visit 8    PT Start Time 0933    PT Stop Time 1012    PT Time Calculation (min) 39 min    Activity Tolerance Patient limited by pain;Patient tolerated treatment well    Behavior During Therapy [facility] for tasks assessed/performed            Past Medical History:  Diagnosis Date   Arthritis    Colon cancer screening 10/20/2018   Diabetes mellitus without complication (HCC)    Gout    Gout    Hypertension    Substance abuse (HCC)    ETOH, 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, CLEAN FOR 13 YEARS   Past Surgical History:  Procedure Laterality Date   BIOPSY  04/20/2019   Procedure: BIOPSY;  Surgeon: Alyce Jubilee, MD;  [facility]: [facility];  Service: Endoscopy;;  transverse polyp x1   COLONOSCOPY WITH PROPOFOL  N/A 04/20/2019   Procedure: COLONOSCOPY WITH PROPOFOL ;  Surgeon: Alyce Jubilee, MD;  Location: [facility];  Service: Endoscopy;  Laterality: N/A;  9:30am-office rescheduled 04/20/19 @ 8:30am   POLYPECTOMY  04/20/2019   Procedure: POLYPECTOMY;  Surgeon: Alyce Jubilee, MD;  Location: [facility];  Service: Endoscopy;;  transversex1, rectal polyp   TOTAL HIP ARTHROPLASTY  2007   due to injury for avascular necrosis.   Patient Active Problem List   Diagnosis Date Noted   Chronic pain of left knee 12/14/2019   Depression, major, single episode,  mild (HCC) 12/14/2019   Left ankle pain 12/14/2019   T2DM (type 2 diabetes mellitus) (HCC) 09/10/2019   Essential hypertension 06/06/2014   Hyperlipidemia 06/06/2014   Gout 06/06/2014   Obesity 06/06/2014    PCP: Austine Lefort, MD  REFERRING PROVIDER: Austine Lefort, MD  REFERRING DIAG: M54.50,G89.29 (ICD-10-CM) - Chronic bilateral low back pain without sciatica  Rationale for Evaluation and Treatment: Rehabilitation  THERAPY DIAG:  Other low back pain  Decreased functional mobility and endurance  Abnormal gait  ONSET DATE: Couple weeks ago  SUBJECTIVE:  SUBJECTIVE STATEMENT: Reports pain has increased since last session, pain scale 8/10 intermittent LBP that is sore.  Reports compliance with HEP daily.  Pt reports improvements by 40% with PT so far.  PERTINENT HISTORY:  History of therapy for neck and shoulder region  PAIN:  Are you having pain? Yes: NPRS scale: 8/10 Pain location: low back central and both sides Pain description: aching pain, Aggravating factors: standing, bending and twisting Relieving factors: relaxing, laying flat, rubbing massaging low back  PRECAUTIONS: None  RED FLAGS: None   WEIGHT BEARING RESTRICTIONS: No  FALLS:  Has patient fallen in last 6 months? No  OCCUPATION: not working  PLOF: Independent  PATIENT GOALS: get back pain to be more tolerable, to not notice low back pain as much  NEXT MD VISIT: after therapy  OBJECTIVE:  Note: Objective measures were completed at Evaluation unless otherwise noted.  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 Lumbar alignment is normal. Vertebral body heights are maintained. Mild disc space narrowing at L3-L4 and L4-S1. Multilevel degenerative osteophytes. Moderate lower lumbar facet degenerative changes   IMPRESSION: 1. 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 2. Multilevel degenerative changes, greatest at the lower lumbar levels.  PATIENT SURVEYS:  Modified Oswestry 19/50   6//13/25: 24 / 50 = 48.0  COGNITION: Overall cognitive status: Within functional limits for tasks assessed     SENSATION: WFL    POSTURE: No Significant postural limitations  PALPATION: Pt tender to palpation of lumbar spine and paraspinal musculature. Pt demonstrates abnormal tightness in erector spinae group even into thoracic region.  LUMBAR ROM:   AROM eval 09/26/23  Flexion 90 degrees, pain coming back up Able to touch toes, some pain coming back up  Extension 9 degrees, pain but not as bad as forward 12 degrees, pain not as bas as forward  Right lateral flexion 25 degrees, pain  1in from knee, pain free  Left lateral flexion 30, little pain Able to touch knee pain free  Right rotation 75% 75% pain free  Left rotation 75% 75% pain free   (Blank rows = not tested)   LOWER EXTREMITY MMT:    MMT Right eval Left eval Right 09/26/23 Left 09/26/23  Hip flexion 4 4- 4+ 4+  Hip extension 4- 3+ 4- 4-  Hip abduction 4 4- 4 4  Hip adduction 4- 3+ 4 4 supine position following Lt posterior hip precautions  Hip internal rotation      Hip external rotation      Knee flexion   4+ 4+  Knee extension      Ankle dorsiflexion      Ankle plantarflexion      Ankle inversion      Ankle eversion       (Blank rows = not tested)  LUMBAR SPECIAL TESTS:  Straight leg raise test: Negative and Slump test: Negative  FUNCTIONAL TESTS:  5 times sit to stand: 22.15 2 minute walk test: 420  09/26/23: 442 ft 09/26/23:  5 STS 20.48  GAIT: Distance walked: 420 feet Assistive device  utilized: None Level of assistance: Complete Independence Comments: Pt demonstrates slight antalgic gait pattern with decreased R arm swing.   TREATMENT DATE:  09/26/23: 5STS MMT ROM  Seated: lumbar support   Modified Oswestry  Low Back Pain Disability Questionnaire: 24 / 50 = 48.0 %   09/24/23 Standing:  hip excursions 3 motions 10X each  Heelraises on incline 20X  Slant board 5X30   Hip vectors 10X5 each with 1 UE assist Postural Green theraband scap retraction 2X10  Green theraband shoulder extension 2X10  Pallof green theraband 2X10   09/17/23 Standing:  hip excursions 3 motions 10X each  Heelraises on incline 20X  Slant board 5X30   Hip vectors 10X5 each with 1 UE assist Postural Green theraband scap retraction 2X10  Green theraband shoulder extension 2X10  Pallof green theraband 2X10   09/12/23 Review of HEP and goals Sit to stand x 5 no UE assist Supine: LTR 3 hold each side x 10 Bridge 3 hold x 10 Nustep seat 12, UE arms 12 x 5' Standing: Heel raises 2 x 10 Slant board 5 x 20 Hip vectors 3 hold x 8 each Green theraband scapular retraction 2 x 10 Green theraband shoulder extension 2 x 10 Paloff press green theraband 2 x 10 each way    08/29/2023  Evaluation: -ROM measured, Strength assessed, HEP prescribed, pt educated on prognosis, findings, and importance of HEP compliance if given.  Therapeutic Exercise: -Supine bridges 2 sets of 10 reps, 3 second holds, symptomatic, pt cued for max hip extension -LTR, 1 set of 10 reps -STS, 1 set of 5 reps                                                                                                                                    PATIENT EDUCATION:  Education details: Pt was educated on findings of PT evaluation, prognosis, frequency of therapy visits and rationale, attendance policy, and HEP if given.   Person educated: Patient Education method: Explanation, Verbal cues, and  Handouts Education comprehension: verbalized understanding and needs further education  HOME EXERCISE PROGRAM: Access Code: [email or web address] URL: [email or web address] Date: 08/29/2023 Prepared by: Armond Bertin  Exercises - Supine Bridge  - 1 x daily - 7 x weekly - 3 sets - 10 reps - Supine Lower Trunk Rotation  - 1 x daily - 7 x weekly - 3 sets - 10 reps - Sit to Stand with Arms Crossed  - 1 x daily - 7 x weekly - 3 sets - 10 reps  ASSESSMENT:  CLINICAL IMPRESSION: Reviewed goals with the following findings:  Pt has met 1/2 STG and 0/4 LTGs.  Pt reports compliance with HEP daily and feels he has improved by 40% since beginning therapy.  Pt continues to be limited by pain and presents with weak hip musculature and decreased self perceived functional abilities noted with Oswestry survey.  Pt does present with increased pain free lumbar mobility.  Reviewed importance of seated posture and shown use of lumbar support for pain control, pt reports decreased pain at EOS to 5/10 (was 8/10 at beginning of session).  Pt will continue to benefit  from skilled intervention to address goals unmet.  Eval:Patient is a 58 y.o. male who was seen today for physical therapy evaluation and treatment for M54.50,G89.29 (ICD-10-CM) - Chronic bilateral low back pain without sciatica.   Patient demonstrates low back pain with functional movements, decreased LE strength, and abnormal gait pattern. Patient also demonstrates antalgic gait pattern with ambulation during today's session with decreased arm swing noted and tightness in low back reported at the end of . Patient also demonstrates decreased LE strength of adductors and hip extensors specifically. Patient also demonstrates most pain with flexion AROM of lumbar spine and tenderness to palpation and mobilization to all four lumbar spinal segments as pts imaging reveals he only has L1-L4. Patient would benefit from skilled physical therapy for decreased low  back pain, increased endurance with ambulation, increased LE/core strength, and improved gait quality for return to higher level of function with ADLs, and progress towards therapy goals.   OBJECTIVE IMPAIRMENTS: Abnormal gait, decreased activity tolerance, decreased balance, decreased endurance, decreased ROM, decreased strength, and pain.   ACTIVITY LIMITATIONS: carrying, lifting, bending, standing, and squatting  PARTICIPATION LIMITATIONS: meal prep, cleaning, laundry, shopping, community activity, and yard work  PERSONAL FACTORS: Past/current experiences are also affecting patient's functional outcome.   REHAB POTENTIAL: Good  CLINICAL DECISION MAKING: Stable/uncomplicated  EVALUATION COMPLEXITY: Low   GOALS: Goals reviewed with patient? No  SHORT TERM GOALS: Target date: 09/12/23  Pt will be independent with HEP in order to demonstrate participation in Physical Therapy POC.  Baseline: 09/26/23:  Reports compliance with HEP daily. Goal status: MET  2.  Pt will report 4/10 pain at worst with mobility in order to demonstrate improved pain with ADLs.  Baseline: 6/10; 09/26/23:  Current pain scale 8/10, average 6/10. Goal status: IN PROGRESS  LONG TERM GOALS: Target date: 09/26/23  Pt will improve 5TSTS by at least 2.3 seconds in order to demonstrate improved functional strength to return to desired activities.  Baseline: see objective. 09/26/23:  5 STS 20.48, improved by 1.67. Goal status: IN PROGRESS  2.  Pt will improve 2 MWT by 140 feet in order to demonstrate improved functional ambulatory capacity in community setting.  Baseline: see objective. ; 09/26/23: 442 ft, improved by 45ft Goal status: IN PROGRESS  3.  Pt will improve Modified Oswestry score by at least 6 points in order to demonstrate improved pain with functional goals and outcomes. Baseline: see objective. : 09/26/23: 24 / 50 = 48.0, increased by 10 points since eval Goal status: IN PROGRESS  4.  Pt  will report 2/10 pain at worst with mobility in order to demonstrate reduced pain with ADLs lasting greater than 30 minutes.  Baseline: see objective. ; 09/26/23:  Current pain scale 8/10, average 6/10. Goal status: IN PROGRESS   PLAN:  PT FREQUENCY: 2x/week  PT DURATION: 4 weeks  PLANNED INTERVENTIONS: 97110-Therapeutic exercises, 97530- Therapeutic activity, [id number]- Neuromuscular re-education, 97535- Self Care, [id number]- Manual therapy, [phone]- Gait training, Patient/Family education, Spinal mobilization, Cryotherapy, and Moist heat.  PLAN FOR NEXT SESSION: progress core/LE strengthening(extensor and adductors), HEP progression.    Minor Amble, LPTA/CLT; Johnye Napoleon [phone]  1:21 PM, 09/26/23  Armond Bertin, PT, DPT [facility] Office: [phone] 7:43 AM, 09/30/23  Managed Medicaid Authorization Request  Visit Dx Codes: M54.59 ; Z74.09 ; R26.9   Functional Tool Score: 24/50 was 19/50  For all possible CPT codes, reference the Planned Interventions line above.     Check all conditions that are expected to  impact treatment: {Conditions expected to impact treatment:Structural or anatomic abnormalities   If treatment provided at initial evaluation, no treatment charged due to lack of authorization.

## 2023-09-29 ENCOUNTER — Encounter (HOSPITAL_COMMUNITY): Admitting: Physical Therapy

## 2023-09-30 NOTE — Addendum Note (Signed)
 Addended by: Kinston Magnan on: 09/30/2023 07:45 AM   Modules accepted: Orders

## 2023-10-01 ENCOUNTER — Ambulatory Visit (HOSPITAL_COMMUNITY)

## 2023-10-01 ENCOUNTER — Encounter (HOSPITAL_COMMUNITY): Payer: Self-pay

## 2023-10-01 DIAGNOSIS — R269 Unspecified abnormalities of gait and mobility: Secondary | ICD-10-CM

## 2023-10-01 DIAGNOSIS — M5459 Other low back pain: Secondary | ICD-10-CM | POA: Diagnosis not present

## 2023-10-01 DIAGNOSIS — Z7409 Other reduced mobility: Secondary | ICD-10-CM

## 2023-10-01 NOTE — Therapy (Signed)
 OUTPATIENT PHYSICAL THERAPY THORACOLUMBAR TREATMENT Progress Note Reporting Period 08/29/23 to 09/26/23  See note below for Objective Data and Assessment of Progress/Goals.      Patient Name: Xavier Daniels MRN: [id number] DOB:January 17, 1966, 58 y.o., male Today's Date: 10/01/2023  END OF SESSION:  PT End of Session - 10/01/23 0933     Visit Number 7    Number of Visits 8    Date for PT Re-Evaluation 10/10/23    Authorization Type [place] MEDICAID HEALTHY BLUE    Authorization - Visit Number 6    Progress Note Due on Visit 8    PT Start Time 0935    PT Stop Time 1013    PT Time Calculation (min) 38 min    Activity Tolerance Patient limited by pain;Patient tolerated treatment well    Behavior During Therapy [facility] for tasks assessed/performed             Past Medical History:  Diagnosis Date   Arthritis    Colon cancer screening 10/20/2018   Diabetes mellitus without complication (HCC)    Gout    Gout    Hypertension    Substance abuse (HCC)    ETOH, 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, CLEAN FOR 13 YEARS   Past Surgical History:  Procedure Laterality Date   BIOPSY  04/20/2019   Procedure: BIOPSY;  Surgeon: Alyce Jubilee, MD;  Location: [facility];  Service: Endoscopy;;  transverse polyp x1   COLONOSCOPY WITH PROPOFOL  N/A 04/20/2019   Procedure: COLONOSCOPY WITH PROPOFOL ;  Surgeon: Alyce Jubilee, MD;  Location: [facility];  Service: Endoscopy;  Laterality: N/A;  9:30am-office rescheduled 04/20/19 @ 8:30am   POLYPECTOMY  04/20/2019   Procedure: POLYPECTOMY;  Surgeon: Alyce Jubilee, MD;  Location: [facility];  Service: Endoscopy;;  transversex1, rectal polyp   TOTAL HIP ARTHROPLASTY  2007   due to injury for avascular necrosis.   Patient Active Problem List   Diagnosis Date Noted   Chronic pain of left knee 12/14/2019   Depression, major, single episode, mild (HCC) 12/14/2019   Left ankle pain 12/14/2019   T2DM (type 2 diabetes mellitus) (HCC) 09/10/2019   Essential hypertension  06/06/2014   Hyperlipidemia 06/06/2014   Gout 06/06/2014   Obesity 06/06/2014    PCP: Austine Lefort, MD  REFERRING PROVIDER: Austine Lefort, MD  REFERRING DIAG: M54.50,G89.29 (ICD-10-CM) - Chronic bilateral low back pain without sciatica  Rationale for Evaluation and Treatment: Rehabilitation  THERAPY DIAG:  Other low back pain  Decreased functional mobility and endurance  Abnormal gait  ONSET DATE: Couple weeks ago  SUBJECTIVE:  SUBJECTIVE STATEMENT: Pt states he is in 6/10 pain today, pretty flared up today. Pt states HEP goes good someday's and other days cause increased pain in low back. Pt presents with brace on right knee, pt states it has had a history of pain and has been bothering him the last few weeks.  PERTINENT HISTORY:  History of therapy for neck and shoulder region  PAIN:  Are you having pain? Yes: NPRS scale: 8/10 Pain location: low back central and both sides Pain description: aching pain, Aggravating factors: standing, bending and twisting Relieving factors: relaxing, laying flat, rubbing massaging low back  PRECAUTIONS: None  RED FLAGS: None   WEIGHT BEARING RESTRICTIONS: No  FALLS:  Has patient fallen in last 6 months? No  OCCUPATION: not working  PLOF: Independent  PATIENT GOALS: get back pain to be more tolerable, to not notice low back pain as much  NEXT MD VISIT: after therapy  OBJECTIVE:  Note: Objective measures were completed at Evaluation unless otherwise noted.  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 Lumbar alignment is normal.  Vertebral body heights are maintained. Mild disc space narrowing at L3-L4 and L4-S1. Multilevel degenerative osteophytes. Moderate lower lumbar facet degenerative changes   IMPRESSION: 1. 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 2. Multilevel degenerative changes, greatest at the lower lumbar levels.  PATIENT SURVEYS:  Modified Oswestry 19/50   6//13/25: 24 / 50 = 48.0  COGNITION: Overall cognitive status: Within functional limits for tasks assessed     SENSATION: WFL    POSTURE: No Significant postural limitations  PALPATION: Pt tender to palpation of lumbar spine and paraspinal musculature. Pt demonstrates abnormal tightness in erector spinae group even into thoracic region.  LUMBAR ROM:   AROM eval 09/26/23  Flexion 90 degrees, pain coming back up Able to touch toes, some pain coming back up  Extension 9 degrees, pain but not as bad as forward 12 degrees, pain not as bas as forward  Right lateral flexion 25 degrees, pain  1in from knee, pain free  Left lateral flexion 30, little pain Able to touch knee pain free  Right rotation 75% 75% pain free  Left rotation 75% 75% pain free   (Blank rows = not tested)   LOWER EXTREMITY MMT:    MMT Right eval Left eval Right 09/26/23 Left 09/26/23  Hip flexion 4 4- 4+ 4+  Hip extension 4- 3+ 4- 4-  Hip abduction 4 4- 4 4  Hip adduction 4- 3+ 4 4 supine position following Lt posterior hip precautions  Hip internal rotation      Hip external rotation      Knee flexion   4+ 4+  Knee extension      Ankle dorsiflexion      Ankle plantarflexion      Ankle inversion      Ankle eversion       (Blank rows = not tested)  LUMBAR SPECIAL TESTS:  Straight leg raise test: Negative and Slump test: Negative  FUNCTIONAL TESTS:  5 times sit to stand: 22.15 2 minute walk test: 420  09/26/23: 442 ft 09/26/23:  5 STS 20.48  GAIT: Distance walked: 420 feet Assistive device utilized: None Level of  assistance: Complete Independence Comments: Pt demonstrates slight antalgic gait pattern with decreased  R arm swing.   TREATMENT DATE:  10/01/2023  Manual Therapy: -CPA of Lumbar Spinal segments L3-L5, grade II-III mobilizations -STM of Lumbar Paraspinal musculature  Therapeutic Exercise: -Treadmill, 2.0 grade, 1.2 speed first 3 minutes, 1.8 for last 2 minutes -DKTC with feet on green theraball, 2 sets of 10 reps, pt cued for pain free ROM  -LTR on green theraball, 2 sets of 10 reps, pt cued for pain free ROM -Supine bridges with GTB at knees, 1 sets of 10 reps, 3 second holds, pt cued for max hip extension -Lateral stepping 1 lap 40 feet per lap, with GTB around ankles, pt cued for upright posture and core activation -Monster walks, 1 lap, 40 foot lap, with GTB at ankles, pt cued for core activation -Forward lumbar rollouts, 1 set of 7 reps, pt cued for UE support throughout rollout and neutral spine    09/26/23: 5STS MMT ROM  Seated: lumbar support   Modified Oswestry Low Back Pain Disability Questionnaire: 24 / 50 = 48.0 %   09/24/23 Standing:  hip excursions 3 motions 10X each  Heelraises on incline 20X  Slant board 5X30   Hip vectors 10X5 each with 1 UE assist Postural Green theraband scap retraction 2X10  Green theraband shoulder extension 2X10  Pallof green theraband 2X10   09/17/23 Standing:  hip excursions 3 motions 10X each  Heelraises on incline 20X  Slant board 5X30   Hip vectors 10X5 each with 1 UE assist Postural Green theraband scap retraction 2X10  Green theraband shoulder extension 2X10  Pallof green theraband 2X10   09/12/23 Review of HEP and goals Sit to stand x 5 no UE assist Supine: LTR 3 hold each side x 10 Bridge 3 hold x 10 Nustep seat 12, UE arms 12 x 5' Standing: Heel raises 2 x 10 Slant board 5 x 20 Hip vectors 3 hold x 8 each Green theraband scapular retraction 2 x 10 Green theraband shoulder extension 2 x 10 Paloff  press green theraband 2 x 10 each way    08/29/2023  Evaluation: -ROM measured, Strength assessed, HEP prescribed, pt educated on prognosis, findings, and importance of HEP compliance if given.  Therapeutic Exercise: -Supine bridges 2 sets of 10 reps, 3 second holds, symptomatic, pt cued for max hip extension -LTR, 1 set of 10 reps -STS, 1 set of 5 reps                                                                                                                                    PATIENT EDUCATION:  Education details: Pt was educated on findings of PT evaluation, prognosis, frequency of therapy visits and rationale, attendance policy, and HEP if given.   Person educated: Patient Education method: Explanation, Verbal cues, and Handouts Education comprehension: verbalized understanding and needs further education  HOME EXERCISE PROGRAM: Access Code: [email or web address] URL: [email or web address] Date: 08/29/2023 Prepared by: Armond Bertin  Exercises -  Supine Bridge  - 1 x daily - 7 x weekly - 3 sets - 10 reps - Supine Lower Trunk Rotation  - 1 x daily - 7 x weekly - 3 sets - 10 reps - Sit to Stand with Arms Crossed  - 1 x daily - 7 x weekly - 3 sets - 10 reps  ASSESSMENT:  CLINICAL IMPRESSION: Patient continues to demonstrate increased low back pain, decreased LE/core strength, decreased gait quality and increased time required for functional transfers. Patient also demonstrates several knots in bilateral paraspinal musculature in lumbar region. Patient able to progress dynamic balance and core activation exercises today with banded walks and lumbar rollout, good performance with verbal cueing. Patient would continue to benefit from skilled physical therapy for increased pain free endurance with ambulation, increased LE/core strength, and improved balance for improved quality of life, improved quality of gait and continued progress towards therapy goals.   Eval:Patient is a  58 y.o. male who was seen today for physical therapy evaluation and treatment for M54.50,G89.29 (ICD-10-CM) - Chronic bilateral low back pain without sciatica.   Patient demonstrates low back pain with functional movements, decreased LE strength, and abnormal gait pattern. Patient also demonstrates antalgic gait pattern with ambulation during today's session with decreased arm swing noted and tightness in low back reported at the end of . Patient also demonstrates decreased LE strength of adductors and hip extensors specifically. Patient also demonstrates most pain with flexion AROM of lumbar spine and tenderness to palpation and mobilization to all four lumbar spinal segments as pts imaging reveals he only has L1-L4. Patient would benefit from skilled physical therapy for decreased low back pain, increased endurance with ambulation, increased LE/core strength, and improved gait quality for return to higher level of function with ADLs, and progress towards therapy goals.   OBJECTIVE IMPAIRMENTS: Abnormal gait, decreased activity tolerance, decreased balance, decreased endurance, decreased ROM, decreased strength, and pain.   ACTIVITY LIMITATIONS: carrying, lifting, bending, standing, and squatting  PARTICIPATION LIMITATIONS: meal prep, cleaning, laundry, shopping, community activity, and yard work  PERSONAL FACTORS: Past/current experiences are also affecting patient's functional outcome.   REHAB POTENTIAL: Good  CLINICAL DECISION MAKING: Stable/uncomplicated  EVALUATION COMPLEXITY: Low   GOALS: Goals reviewed with patient? No  SHORT TERM GOALS: Target date: 09/12/23  Pt will be independent with HEP in order to demonstrate participation in Physical Therapy POC.  Baseline: 09/26/23:  Reports compliance with HEP daily. Goal status: MET  2.  Pt will report 4/10 pain at worst with mobility in order to demonstrate improved pain with ADLs.  Baseline: 6/10; 09/26/23:  Current pain scale  8/10, average 6/10. Goal status: IN PROGRESS  LONG TERM GOALS: Target date: 09/26/23  Pt will improve 5TSTS by at least 2.3 seconds in order to demonstrate improved functional strength to return to desired activities.  Baseline: see objective. 09/26/23:  5 STS 20.48, improved by 1.67. Goal status: IN PROGRESS  2.  Pt will improve 2 MWT by 140 feet in order to demonstrate improved functional ambulatory capacity in community setting.  Baseline: see objective. ; 09/26/23: 442 ft, improved by 43ft Goal status: IN PROGRESS  3.  Pt will improve Modified Oswestry score by at least 6 points in order to demonstrate improved pain with functional goals and outcomes. Baseline: see objective. : 09/26/23: 24 / 50 = 48.0, increased by 10 points since eval Goal status: IN PROGRESS  4.  Pt will report 2/10 pain at worst with mobility in order to demonstrate  reduced pain with ADLs lasting greater than 30 minutes.  Baseline: see objective. ; 09/26/23:  Current pain scale 8/10, average 6/10. Goal status: IN PROGRESS   PLAN:  PT FREQUENCY: 2x/week  PT DURATION: 4 weeks  PLANNED INTERVENTIONS: 97110-Therapeutic exercises, 97530- Therapeutic activity, [id number]- Neuromuscular re-education, 97535- Self Care, [id number]- Manual therapy, [phone]- Gait training, Patient/Family education, Spinal mobilization, Cryotherapy, and Moist heat.  PLAN FOR NEXT SESSION: progress core/LE strengthening(extensor and adductors), HEP progression.    Armond Bertin, PT, DPT [facility] Office: [phone] 10:19 AM, 10/01/23

## 2023-10-06 ENCOUNTER — Other Ambulatory Visit: Payer: Self-pay | Admitting: Family Medicine

## 2023-10-07 ENCOUNTER — Ambulatory Visit (HOSPITAL_COMMUNITY): Admitting: Physical Therapy

## 2023-10-07 DIAGNOSIS — Z7409 Other reduced mobility: Secondary | ICD-10-CM

## 2023-10-07 DIAGNOSIS — M5459 Other low back pain: Secondary | ICD-10-CM | POA: Diagnosis not present

## 2023-10-07 NOTE — Therapy (Signed)
 OUTPATIENT PHYSICAL THERAPY THORACOLUMBAR TREATMENT    Patient Name: Xavier Daniels MRN: [id number] DOB:[birth year], 58 y.o., male Today's Date: 10/07/2023  END OF SESSION:  PT End of Session - 10/07/23 0855     Visit Number 8    Number of Visits 10    Date for PT Re-Evaluation 10/10/23    Authorization Type [place] MEDICAID HEALTHY BLUE    Authorization Time Period 4 visits approved 6/16-7/15    Authorization - Visit Number 2    Authorization - Number of Visits 4    Progress Note Due on Visit 10    PT Start Time 0848    PT Stop Time 0928    PT Time Calculation (min) 40 min    Activity Tolerance Patient limited by pain;Patient tolerated treatment well    Behavior During Therapy [facility] for tasks assessed/performed              Past Medical History:  Diagnosis Date   Arthritis    Colon cancer screening 10/20/2018   Diabetes mellitus without complication (HCC)    Gout    Gout    Hypertension    Substance abuse (HCC)    ETOH, 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, CLEAN FOR 13 YEARS   Past Surgical History:  Procedure Laterality Date   BIOPSY  04/20/2019   Procedure: BIOPSY;  Surgeon: Harvey Margo CROME, MD;  [facility];  Service: Endoscopy;;  transverse polyp x1   COLONOSCOPY WITH PROPOFOL  N/A 04/20/2019   Procedure: COLONOSCOPY WITH PROPOFOL ;  Surgeon: Harvey Margo CROME, MD;  [facility];  Service: Endoscopy;  Laterality: N/A;  9:30am-office rescheduled 04/20/19 @ 8:30am   POLYPECTOMY  04/20/2019   Procedure: POLYPECTOMY;  Surgeon: Harvey Margo CROME, MD;  [facility];  Service: Endoscopy;;  transversex1, rectal polyp   TOTAL HIP ARTHROPLASTY  2007   due to injury for avascular necrosis.   Patient Active Problem List   Diagnosis Date Noted   Chronic pain of left knee 12/14/2019   Depression, major, single episode, mild (HCC) 12/14/2019   Left ankle pain 12/14/2019   T2DM (type 2 diabetes mellitus) (HCC) 09/10/2019   Essential hypertension 06/06/2014    Hyperlipidemia 06/06/2014   Gout 06/06/2014   Obesity 06/06/2014    PCP: Duanne Butler DASEN, MD  REFERRING PROVIDER: Duanne Butler DASEN, MD  REFERRING DIAG: M54.50,G89.29 (ICD-10-CM) - Chronic bilateral low back pain without sciatica  Rationale for Evaluation and Treatment: Rehabilitation  THERAPY DIAG:  Other low back pain  Decreased functional mobility and endurance  ONSET DATE: Couple weeks ago  SUBJECTIVE:  SUBJECTIVE STATEMENT: Pt states he is a little better.  Overall feeling pretty good today less than 5/10.  Presents with neoprene brace on right knee.  PERTINENT HISTORY:  History of therapy for neck and shoulder region  PAIN:  Are you having pain? Yes: NPRS scale: 8/10 Pain location: low back central and both sides Pain description: aching pain, Aggravating factors: standing, bending and twisting Relieving factors: relaxing, laying flat, rubbing massaging low back  PRECAUTIONS: None  RED FLAGS: None   WEIGHT BEARING RESTRICTIONS: No  FALLS:  Has patient fallen in last 6 months? No  OCCUPATION: not working  PLOF: Independent  PATIENT GOALS: get back pain to be more tolerable, to not notice low back pain as much  NEXT MD VISIT: after therapy  OBJECTIVE:  Note: Objective measures were completed at Evaluation unless otherwise noted.  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 Lumbar alignment is normal. Vertebral body heights are maintained. Mild disc space narrowing at L3-L4 and L4-S1. Multilevel degenerative osteophytes. Moderate lower lumbar facet degenerative changes    IMPRESSION: 1. 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 2. Multilevel degenerative changes, greatest at the lower lumbar levels.  PATIENT SURVEYS:  Modified Oswestry 19/50   6//13/25: 24 / 50 = 48.0  COGNITION: Overall cognitive status: Within functional limits for tasks assessed     SENSATION: WFL    POSTURE: No Significant postural limitations  PALPATION: Pt tender to palpation of lumbar spine and paraspinal musculature. Pt demonstrates abnormal tightness in erector spinae group even into thoracic region.  LUMBAR ROM:   AROM eval 09/26/23  Flexion 90 degrees, pain coming back up Able to touch toes, some pain coming back up  Extension 9 degrees, pain but not as bad as forward 12 degrees, pain not as bas as forward  Right lateral flexion 25 degrees, pain  1in from knee, pain free  Left lateral flexion 30, little pain Able to touch knee pain free  Right rotation 75% 75% pain free  Left rotation 75% 75% pain free   (Blank rows = not tested)   LOWER EXTREMITY MMT:    MMT Right eval Left eval Right 09/26/23 Left 09/26/23  Hip flexion 4 4- 4+ 4+  Hip extension 4- 3+ 4- 4-  Hip abduction 4 4- 4 4  Hip adduction 4- 3+ 4 4 supine position following Lt posterior hip precautions  Hip internal rotation      Hip external rotation      Knee flexion   4+ 4+  Knee extension      Ankle dorsiflexion      Ankle plantarflexion      Ankle inversion      Ankle eversion       (Blank rows = not tested)  LUMBAR SPECIAL TESTS:  Straight leg raise test: Negative and Slump test: Negative  FUNCTIONAL TESTS:  5 times sit to stand: 22.15 2 minute walk test: 420  09/26/23: 442 ft 09/26/23:  5 STS 20.48  GAIT: Distance walked: 420 feet Assistive device utilized: None Level of assistance: Complete Independence Comments: Pt demonstrates slight antalgic gait pattern with decreased R arm swing.   TREATMENT DATE:  10/07/23 Treadmill, 2.0 grade, 1.2 speed  first 3 minutes, 1.8 for last 2 minutes  Standing:  hip excursions 3  motions 10X each  Hamstring stretch 12 2X30  Slant board 5X30   Hip vectors 10X5 each with 1 UE assist Postural Blue theraband scap retraction X10  Blue theraband shoulder extension X10  Pallof Blue theraband X10    10/01/2023  Manual Therapy: -CPA of Lumbar Spinal segments L3-L5, grade II-III mobilizations -STM of Lumbar Paraspinal musculature  Therapeutic Exercise: -Treadmill, 2.0 grade, 1.2 speed first 3 minutes, 1.8 for last 2 minutes -DKTC with feet on green theraball, 2 sets of 10 reps, pt cued for pain free ROM  -LTR on green theraball, 2 sets of 10 reps, pt cued for pain free ROM -Supine bridges with GTB at knees, 1 sets of 10 reps, 3 second holds, pt cued for max hip extension -Lateral stepping 1 lap 40 feet per lap, with GTB around ankles, pt cued for upright posture and core activation -Monster walks, 1 lap, 40 foot lap, with GTB at ankles, pt cued for core activation -Forward lumbar rollouts, 1 set of 7 reps, pt cued for UE support throughout rollout and neutral spine    09/26/23: 5STS MMT ROM  Seated: lumbar support   Modified Oswestry Low Back Pain Disability Questionnaire: 24 / 50 = 48.0 %                                                                                                                                PATIENT EDUCATION:  Education details: Pt was educated on findings of PT evaluation, prognosis, frequency of therapy visits and rationale, attendance policy, and HEP if given.   Person educated: Patient Education method: Explanation, Verbal cues, and Handouts Education comprehension: verbalized understanding and needs further education  HOME EXERCISE PROGRAM: Access Code: [email or web address] URL: [email or web address] Date: 08/29/2023 Prepared by: Lang Ada  Exercises - Supine Bridge  - 1 x daily - 7 x weekly - 3 sets - 10 reps - Supine Lower Trunk Rotation   - 1 x daily - 7 x weekly - 3 sets - 10 reps - Sit to Stand with Arms Crossed  - 1 x daily - 7 x weekly - 3 sets - 10 reps  ASSESSMENT:  CLINICAL IMPRESSION: Continued with focus on core stability, lumbar ROM and LE strength.  Began on TM with ability to complete full time with buoyant stride and no complaints.  Added hamstring stretch today in standing as exhibited tight HS musculature.  Increased to blue band with postural/core challenges with good control and maintenance of posture. Pt overall jovial throughout session, no complaints of pain during or at completion of exercises.  Lively, non-antalgic gait.  Encouraged to continue with regular massages from a massage therapist as reports he was getting these and seemed to help.     Eval:Patient is a 58 y.o. male who was seen today for physical therapy evaluation and treatment for M54.50,G89.29 (ICD-10-CM) - Chronic bilateral low back pain without sciatica.   Patient demonstrates low back pain with functional  movements, decreased LE strength, and abnormal gait pattern. Patient also demonstrates antalgic gait pattern with ambulation during today's session with decreased arm swing noted and tightness in low back reported at the end of . Patient also demonstrates decreased LE strength of adductors and hip extensors specifically. Patient also demonstrates most pain with flexion AROM of lumbar spine and tenderness to palpation and mobilization to all four lumbar spinal segments as pts imaging reveals he only has L1-L4. Patient would benefit from skilled physical therapy for decreased low back pain, increased endurance with ambulation, increased LE/core strength, and improved gait quality for return to higher level of function with ADLs, and progress towards therapy goals.   OBJECTIVE IMPAIRMENTS: Abnormal gait, decreased activity tolerance, decreased balance, decreased endurance, decreased ROM, decreased strength, and pain.   ACTIVITY LIMITATIONS:  carrying, lifting, bending, standing, and squatting  PARTICIPATION LIMITATIONS: meal prep, cleaning, laundry, shopping, community activity, and yard work  PERSONAL FACTORS: Past/current experiences are also affecting patient's functional outcome.   REHAB POTENTIAL: Good  CLINICAL DECISION MAKING: Stable/uncomplicated  EVALUATION COMPLEXITY: Low   GOALS: Goals reviewed with patient? No  SHORT TERM GOALS: Target date: 09/12/23  Pt will be independent with HEP in order to demonstrate participation in Physical Therapy POC.  Baseline: 09/26/23:  Reports compliance with HEP daily. Goal status: MET  2.  Pt will report 4/10 pain at worst with mobility in order to demonstrate improved pain with ADLs.  Baseline: 6/10; 09/26/23:  Current pain scale 8/10, average 6/10. Goal status: IN PROGRESS  LONG TERM GOALS: Target date: 09/26/23  Pt will improve 5TSTS by at least 2.3 seconds in order to demonstrate improved functional strength to return to desired activities.  Baseline: see objective. 09/26/23:  5 STS 20.48, improved by 1.67. Goal status: IN PROGRESS  2.  Pt will improve 2 MWT by 140 feet in order to demonstrate improved functional ambulatory capacity in community setting.  Baseline: see objective. ; 09/26/23: 442 ft, improved by 80ft Goal status: IN PROGRESS  3.  Pt will improve Modified Oswestry score by at least 6 points in order to demonstrate improved pain with functional goals and outcomes. Baseline: see objective. : 09/26/23: 24 / 50 = 48.0, increased by 10 points since eval Goal status: IN PROGRESS  4.  Pt will report 2/10 pain at worst with mobility in order to demonstrate reduced pain with ADLs lasting greater than 30 minutes.  Baseline: see objective. ; 09/26/23:  Current pain scale 8/10, average 6/10. Goal status: IN PROGRESS   PLAN:  PT FREQUENCY: 2x/week  PT DURATION: 4 weeks  PLANNED INTERVENTIONS: 97110-Therapeutic exercises, 97530- Therapeutic activity,  [id number]- Neuromuscular re-education, 97535- Self Care, 02859- Manual therapy, [phone]- Gait training, Patient/Family education, Spinal mobilization, Cryotherapy, and Moist heat.  PLAN FOR NEXT SESSION: progress core/LE strengthening(extensor and adductors), HEP progression.    Greig KATHEE Fuse, PTA/CLT [facility] Ph: [phone] 10:29 AM, 10/07/23

## 2023-10-09 ENCOUNTER — Ambulatory Visit (HOSPITAL_COMMUNITY): Admitting: Physical Therapy

## 2023-10-09 DIAGNOSIS — M5459 Other low back pain: Secondary | ICD-10-CM | POA: Diagnosis not present

## 2023-10-09 DIAGNOSIS — R269 Unspecified abnormalities of gait and mobility: Secondary | ICD-10-CM

## 2023-10-09 DIAGNOSIS — Z7409 Other reduced mobility: Secondary | ICD-10-CM

## 2023-10-09 NOTE — Therapy (Signed)
 OUTPATIENT PHYSICAL THERAPY THORACOLUMBAR TREATMENT    Patient Name: Xavier Daniels MRN: [id number] DOB:1965/10/17, 58 y.o., male Today's Date: 10/09/2023  END OF SESSION:  PT End of Session - 10/09/23 1101     Visit Number 9    Number of Visits 10    Date for PT Re-Evaluation 10/10/23    Authorization Type [place] MEDICAID HEALTHY BLUE    Authorization Time Period 4 visits approved 6/16-7/15    Authorization - Visit Number 3    Authorization - Number of Visits 4    Progress Note Due on Visit 10    PT Start Time [phone]    PT Stop Time 1016    PT Time Calculation (min) 38 min    Activity Tolerance Patient limited by pain;Patient tolerated treatment well    Behavior During Therapy [facility] for tasks assessed/performed               Past Medical History:  Diagnosis Date   Arthritis    Colon cancer screening 10/20/2018   Diabetes mellitus without complication (HCC)    Gout    Gout    Hypertension    Substance abuse (HCC)    ETOH, 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, CLEAN FOR 13 YEARS   Past Surgical History:  Procedure Laterality Date   BIOPSY  04/20/2019   Procedure: BIOPSY;  Surgeon: Harvey Margo CROME, MD;  [facility];  Service: Endoscopy;;  transverse polyp x1   COLONOSCOPY WITH PROPOFOL  N/A 04/20/2019   Procedure: COLONOSCOPY WITH PROPOFOL ;  Surgeon: Harvey Margo CROME, MD;  [facility];  Service: Endoscopy;  Laterality: N/A;  9:30am-office rescheduled 04/20/19 @ 8:30am   POLYPECTOMY  04/20/2019   Procedure: POLYPECTOMY;  Surgeon: Harvey Margo CROME, MD;  [facility];  Service: Endoscopy;;  transversex1, rectal polyp   TOTAL HIP ARTHROPLASTY  2007   due to injury for avascular necrosis.   Patient Active Problem List   Diagnosis Date Noted   Chronic pain of left knee 12/14/2019   Depression, major, single episode, mild (HCC) 12/14/2019   Left ankle pain 12/14/2019   T2DM (type 2 diabetes mellitus) (HCC) 09/10/2019   Essential hypertension 06/06/2014    Hyperlipidemia 06/06/2014   Gout 06/06/2014   Obesity 06/06/2014    PCP: Duanne Butler DASEN, MD  REFERRING PROVIDER: Duanne Butler DASEN, MD  REFERRING DIAG: M54.50,G89.29 (ICD-10-CM) - Chronic bilateral low back pain without sciatica  Rationale for Evaluation and Treatment: Rehabilitation  THERAPY DIAG:  Other low back pain  Decreased functional mobility and endurance  Abnormal gait  ONSET DATE: Couple weeks ago  SUBJECTIVE:  SUBJECTIVE STATEMENT: Pt states his pain is 110/10 Pt denies need to go to ED.  No gait disturbance or pain behaviors observed by therapist.  Later reported more soreness than pain .  PERTINENT HISTORY:  History of therapy for neck and shoulder region  PAIN:  Are you having pain? Yes: NPRS scale: 8/10 Pain location: low back central and both sides Pain description: aching pain, Aggravating factors: standing, bending and twisting Relieving factors: relaxing, laying flat, rubbing massaging low back  PRECAUTIONS: None  RED FLAGS: None   WEIGHT BEARING RESTRICTIONS: No  FALLS:  Has patient fallen in last 6 months? No  OCCUPATION: not working  PLOF: Independent  PATIENT GOALS: get back pain to be more tolerable, to not notice low back pain as much  NEXT MD VISIT: after therapy  OBJECTIVE:  Note: Objective measures were completed at Evaluation unless otherwise noted.  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 Lumbar alignment is normal. Vertebral body heights are maintained. Mild disc space narrowing at L3-L4 and L4-S1. Multilevel degenerative osteophytes.  Moderate lower lumbar facet degenerative changes   IMPRESSION: 1. 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 2. Multilevel degenerative changes, greatest at the lower lumbar levels.  PATIENT SURVEYS:  Modified Oswestry 19/50   6//13/25: 24 / 50 = 48.0  COGNITION: Overall cognitive status: Within functional limits for tasks assessed     SENSATION: WFL    POSTURE: No Significant postural limitations  PALPATION: Pt tender to palpation of lumbar spine and paraspinal musculature. Pt demonstrates abnormal tightness in erector spinae group even into thoracic region.  LUMBAR ROM:   AROM eval 09/26/23  Flexion 90 degrees, pain coming back up Able to touch toes, some pain coming back up  Extension 9 degrees, pain but not as bad as forward 12 degrees, pain not as bas as forward  Right lateral flexion 25 degrees, pain  1in from knee, pain free  Left lateral flexion 30, little pain Able to touch knee pain free  Right rotation 75% 75% pain free  Left rotation 75% 75% pain free   (Blank rows = not tested)   LOWER EXTREMITY MMT:    MMT Right eval Left eval Right 09/26/23 Left 09/26/23  Hip flexion 4 4- 4+ 4+  Hip extension 4- 3+ 4- 4-  Hip abduction 4 4- 4 4  Hip adduction 4- 3+ 4 4 supine position following Lt posterior hip precautions  Hip internal rotation      Hip external rotation      Knee flexion   4+ 4+  Knee extension      Ankle dorsiflexion      Ankle plantarflexion      Ankle inversion      Ankle eversion       (Blank rows = not tested)  LUMBAR SPECIAL TESTS:  Straight leg raise test: Negative and Slump test: Negative  FUNCTIONAL TESTS:  5 times sit to stand: 22.15 2 minute walk test: 420  09/26/23: 442 ft 09/26/23:  5 STS 20.48  GAIT: Distance walked: 420 feet Assistive device utilized: None Level of assistance: Complete Independence Comments: Pt demonstrates slight antalgic gait pattern with decreased R arm swing.   TREATMENT  DATE:  10/09/23 Treadmill, 2.0 grade, 1.8 speed 5 minutes  Standing:  hip excursions  3 motions 10X each  Hamstring stretch 12 2X30  Slant board 5X30   Hip vectors 10X5 each with 1 UE assist Postural Blue theraband scap retraction 2X10  Blue theraband shoulder extension 2X10  Pallof Blue theraband 2X10    10/07/23 Treadmill, 2.0 grade, 1.2 speed first 3 minutes, 1.8 for last 2 minutes  Standing:  hip excursions 3 motions 10X each  Hamstring stretch 12 2X30  Slant board 5X30   Hip vectors 10X5 each with 1 UE assist Postural Blue theraband scap retraction X10  Blue theraband shoulder extension X10  Pallof Blue theraband X10    10/01/2023  Manual Therapy: -CPA of Lumbar Spinal segments L3-L5, grade II-III mobilizations -STM of Lumbar Paraspinal musculature  Therapeutic Exercise: -Treadmill, 2.0 grade, 1.2 speed first 3 minutes, 1.8 for last 2 minutes -DKTC with feet on green theraball, 2 sets of 10 reps, pt cued for pain free ROM  -LTR on green theraball, 2 sets of 10 reps, pt cued for pain free ROM -Supine bridges with GTB at knees, 1 sets of 10 reps, 3 second holds, pt cued for max hip extension -Lateral stepping 1 lap 40 feet per lap, with GTB around ankles, pt cued for upright posture and core activation -Monster walks, 1 lap, 40 foot lap, with GTB at ankles, pt cued for core activation -Forward lumbar rollouts, 1 set of 7 reps, pt cued for UE support throughout rollout and neutral spine    09/26/23: 5STS MMT ROM  Seated: lumbar support   Modified Oswestry Low Back Pain Disability Questionnaire: 24 / 50 = 48.0 %                                                                                                                                PATIENT EDUCATION:  Education details: Pt was educated on findings of PT evaluation, prognosis, frequency of therapy visits and rationale, attendance policy, and HEP if given.   Person educated: Patient Education  method: Explanation, Verbal cues, and Handouts Education comprehension: verbalized understanding and needs further education  HOME EXERCISE PROGRAM: Access Code: [email or web address] URL: [email or web address] Date: 08/29/2023 Prepared by: Lang Ada  Exercises - Supine Bridge  - 1 x daily - 7 x weekly - 3 sets - 10 reps - Supine Lower Trunk Rotation  - 1 x daily - 7 x weekly - 3 sets - 10 reps - Sit to Stand with Arms Crossed  - 1 x daily - 7 x weekly - 3 sets - 10 reps  ASSESSMENT:  CLINICAL IMPRESSION: Increased speed and additional set of postural strengthening added with overall good form and minimal cues.  Remained overall jovial throughout session, no complaints of pain during or at completion of exercises despite high pain rating.  No analgia or pain behaviors observed throughout session.  When questioned if therapy was helping his pain he replied no , however did report feeling stronger. Pt has one visit remaining.  Eval:Patient is a 58 y.o. male who was seen today for physical therapy evaluation and treatment for M54.50,G89.29 (ICD-10-CM) - Chronic bilateral low back pain without sciatica.   Patient demonstrates low back pain with functional movements, decreased LE strength, and abnormal gait pattern. Patient also demonstrates antalgic gait pattern with ambulation during today's session with decreased arm swing noted and tightness in low back reported at the end of . Patient also demonstrates decreased LE strength of adductors and hip extensors specifically. Patient also demonstrates most pain with flexion AROM of lumbar spine and tenderness to palpation and mobilization to all four lumbar spinal segments as pts imaging reveals he only has L1-L4. Patient would benefit from skilled physical therapy for decreased low back pain, increased endurance with ambulation, increased LE/core strength, and improved gait quality for return to higher level of function with ADLs, and  progress towards therapy goals.   OBJECTIVE IMPAIRMENTS: Abnormal gait, decreased activity tolerance, decreased balance, decreased endurance, decreased ROM, decreased strength, and pain.   ACTIVITY LIMITATIONS: carrying, lifting, bending, standing, and squatting  PARTICIPATION LIMITATIONS: meal prep, cleaning, laundry, shopping, community activity, and yard work  PERSONAL FACTORS: Past/current experiences are also affecting patient's functional outcome.   REHAB POTENTIAL: Good  CLINICAL DECISION MAKING: Stable/uncomplicated  EVALUATION COMPLEXITY: Low   GOALS: Goals reviewed with patient? No  SHORT TERM GOALS: Target date: 09/12/23  Pt will be independent with HEP in order to demonstrate participation in Physical Therapy POC.  Baseline: 09/26/23:  Reports compliance with HEP daily. Goal status: MET  2.  Pt will report 4/10 pain at worst with mobility in order to demonstrate improved pain with ADLs.  Baseline: 6/10; 09/26/23:  Current pain scale 8/10, average 6/10. Goal status: IN PROGRESS  LONG TERM GOALS: Target date: 09/26/23  Pt will improve 5TSTS by at least 2.3 seconds in order to demonstrate improved functional strength to return to desired activities.  Baseline: see objective. 09/26/23:  5 STS 20.48, improved by 1.67. Goal status: IN PROGRESS  2.  Pt will improve 2 MWT by 140 feet in order to demonstrate improved functional ambulatory capacity in community setting.  Baseline: see objective. ; 09/26/23: 442 ft, improved by 58ft Goal status: IN PROGRESS  3.  Pt will improve Modified Oswestry score by at least 6 points in order to demonstrate improved pain with functional goals and outcomes. Baseline: see objective. : 09/26/23: 24 / 50 = 48.0, increased by 10 points since eval Goal status: IN PROGRESS  4.  Pt will report 2/10 pain at worst with mobility in order to demonstrate reduced pain with ADLs lasting greater than 30 minutes.  Baseline: see objective. ;  09/26/23:  Current pain scale 8/10, average 6/10. Goal status: IN PROGRESS   PLAN:  PT FREQUENCY: 2x/week  PT DURATION: 4 weeks  PLANNED INTERVENTIONS: 97110-Therapeutic exercises, 97530- Therapeutic activity, [id number]- Neuromuscular re-education, 97535- Self Care, 02859- Manual therapy, [phone]- Gait training, Patient/Family education, Spinal mobilization, Cryotherapy, and Moist heat.  PLAN FOR NEXT SESSION: progress core/LE strengthening(extensor and adductors), HEP progression.  Re-evaluate next session.   Greig KATHEE Fuse, PTA/CLT [facility] Ph: [phone] 11:02 AM, 10/09/23

## 2023-10-15 ENCOUNTER — Ambulatory Visit (HOSPITAL_COMMUNITY): Attending: Family Medicine

## 2023-10-15 ENCOUNTER — Encounter (HOSPITAL_COMMUNITY): Payer: Self-pay

## 2023-10-15 DIAGNOSIS — R293 Abnormal posture: Secondary | ICD-10-CM | POA: Diagnosis present

## 2023-10-15 DIAGNOSIS — M542 Cervicalgia: Secondary | ICD-10-CM | POA: Diagnosis present

## 2023-10-15 DIAGNOSIS — M5459 Other low back pain: Secondary | ICD-10-CM | POA: Insufficient documentation

## 2023-10-15 DIAGNOSIS — R269 Unspecified abnormalities of gait and mobility: Secondary | ICD-10-CM | POA: Insufficient documentation

## 2023-10-15 DIAGNOSIS — Z7409 Other reduced mobility: Secondary | ICD-10-CM | POA: Insufficient documentation

## 2023-10-15 NOTE — Therapy (Signed)
 OUTPATIENT PHYSICAL THERAPY THORACOLUMBAR TREATMENT/DISCHARGE  PHYSICAL THERAPY DISCHARGE SUMMARY  Visits from Start of Care: 9  Current functional level related to goals / functional outcomes: Impaired, only 2/6 goals met   Remaining deficits: Pain, strength, endurance, activity tolerance   Education / Equipment: Pt educated on HEP, core strengthening paired with pain free motion of lumbar spine.   Patient agrees to discharge. Patient goals were partially met. Patient is being discharged due to lack of progress.   Patient Name: Xavier Daniels MRN: [id number] DOB:09-15-1965, 58 y.o., male Today's Date: 10/15/2023  END OF SESSION:  PT End of Session - 10/15/23 0851     Visit Number 10    Number of Visits 10    Date for PT Re-Evaluation 10/10/23    Authorization Type  MEDICAID HEALTHY BLUE    Authorization Time Period 4 visits approved 6/16-7/15    Authorization - Visit Number 4    Authorization - Number of Visits 4    Progress Note Due on Visit 10    PT Start Time 0850    PT Stop Time 0920    PT Time Calculation (min) 30 min    Activity Tolerance Patient limited by pain;Patient tolerated treatment well    Behavior During Therapy [facility] for tasks assessed/performed                Past Medical History:  Diagnosis Date   Arthritis    Colon cancer screening 10/20/2018   Diabetes mellitus without complication (HCC)    Gout    Gout    Hypertension    Substance abuse (HCC)    ETOH, 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, CLEAN FOR 13 YEARS   Past Surgical History:  Procedure Laterality Date   BIOPSY  04/20/2019   Procedure: BIOPSY;  Surgeon: Harvey Margo CROME, MD;  [facility];  Service: Endoscopy;;  transverse polyp x1   COLONOSCOPY WITH PROPOFOL  N/A 04/20/2019   Procedure: COLONOSCOPY WITH PROPOFOL ;  Surgeon: Harvey Margo CROME, MD;  [facility];  Service: Endoscopy;  Laterality: N/A;  9:30am-office rescheduled 04/20/19 @ 8:30am   POLYPECTOMY  04/20/2019   Procedure:  POLYPECTOMY;  Surgeon: Harvey Margo CROME, MD;  [facility];  Service: Endoscopy;;  transversex1, rectal polyp   TOTAL HIP ARTHROPLASTY  2007   due to injury for avascular necrosis.   Patient Active Problem List   Diagnosis Date Noted   Chronic pain of left knee 12/14/2019   Depression, major, single episode, mild (HCC) 12/14/2019   Left ankle pain 12/14/2019   T2DM (type 2 diabetes mellitus) (HCC) 09/10/2019   Essential hypertension 06/06/2014   Hyperlipidemia 06/06/2014   Gout 06/06/2014   Obesity 06/06/2014    PCP: Duanne Butler DASEN, MD  REFERRING PROVIDER: Duanne Butler DASEN, MD  REFERRING DIAG: M54.50,G89.29 (ICD-10-CM) - Chronic bilateral low back pain without sciatica  Rationale for Evaluation and Treatment: Rehabilitation  THERAPY DIAG:  Other low back pain  Decreased functional mobility and endurance  Abnormal gait  Cervicalgia  Abnormal posture  ONSET DATE: Couple weeks ago  SUBJECTIVE:  SUBJECTIVE STATEMENT: Pt states pain is a little bit better, about 5/10. Pt states he would like to try massage therapy. Pt states he has done some of the HEP.  PERTINENT HISTORY:  History of therapy for neck and shoulder region  PAIN:  Are you having pain? Yes: NPRS scale: 8/10 Pain location: low back central and both sides Pain description: aching pain, Aggravating factors: standing, bending and twisting Relieving factors: relaxing, laying flat, rubbing massaging low back  PRECAUTIONS: None  RED FLAGS: None   WEIGHT BEARING RESTRICTIONS: No  FALLS:  Has patient fallen in last 6 months? No  OCCUPATION: not working  PLOF: Independent  PATIENT GOALS: get back pain to be more tolerable, to not notice low back pain as much  NEXT MD VISIT: after therapy  OBJECTIVE:   Note: Objective measures were completed at Evaluation unless otherwise noted.  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 Lumbar alignment is normal. Vertebral body heights are maintained. Mild disc space narrowing at L3-L4 and L4-S1. Multilevel degenerative osteophytes. Moderate lower lumbar facet degenerative changes   IMPRESSION: 1. 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 2. Multilevel degenerative changes, greatest at the lower lumbar levels.  PATIENT SURVEYS:  Modified Oswestry 19/50   6//13/25: 24 / 50 = 48.0  COGNITION: Overall cognitive status: Within functional limits for tasks assessed     SENSATION: WFL    POSTURE: No Significant postural limitations  PALPATION: Pt tender to palpation of lumbar spine and paraspinal musculature. Pt demonstrates abnormal tightness in erector spinae group even into thoracic region.  LUMBAR ROM:   AROM eval 09/26/23 10/15/23  Flexion 90 degrees, pain coming back up Able to touch toes, some pain coming back up 90 degrees, pain  Extension 9 degrees, pain but not as bad as forward 12 degrees, pain not as bas as forward No pain  Right lateral flexion 25 degrees, pain  1in from knee, pain free 15 degrees, no pain  Left lateral flexion 30, little pain Able to touch knee pain free 16, no pain  Right rotation 75% 75% pain free Pain free  Left rotation 75% 75% pain free Pain free   (Blank rows = not tested)   LOWER EXTREMITY MMT:    MMT Right eval Left eval Right 09/26/23 Left 09/26/23  Hip flexion 4 4- 4+ 4+  Hip extension 4- 3+ 4- 4-  Hip abduction 4 4- 4 4  Hip adduction 4- 3+ 4 4 supine position following Lt posterior hip precautions  Hip internal rotation      Hip external rotation      Knee flexion   4+ 4+   Knee extension      Ankle dorsiflexion      Ankle plantarflexion      Ankle inversion      Ankle eversion       (Blank rows = not tested)  LUMBAR SPECIAL TESTS:  Straight leg raise test: Negative and Slump test: Negative  FUNCTIONAL TESTS:  5 times sit to stand: 22.15 2 minute walk test: 420  09/26/23: 442 ft 09/26/23:  5 STS 20.48  GAIT: Distance walked: 420 feet Assistive device utilized: None Level of assistance: Complete Independence Comments: Pt demonstrates slight antalgic gait pattern with decreased R arm swing.   TREATMENT DATE:  10/15/2023  Therapeutic Exercise: -Treadmill, 2.0 grade, 1.8 speed for 5 minutes -5TSTS  -Side plank, 3 sets 5 second hold -modified crunch, 2 sets of 5 reps -Bird dog, 1 set of 8 reps bilaterally  10/09/23 Treadmill, 2.0 grade, 1.8 speed 5 minutes  Standing:  hip excursions 3 motions 10X each  Hamstring stretch 12 2X30  Slant board 5X30   Hip vectors 10X5 each with 1 UE assist Postural Blue theraband scap retraction 2X10  Blue theraband shoulder extension 2X10  Pallof Blue theraband 2X10    10/07/23 Treadmill, 2.0 grade, 1.2 speed first 3 minutes, 1.8 for last 2 minutes  Standing:  hip excursions 3 motions 10X each  Hamstring stretch 12 2X30  Slant board 5X30   Hip vectors 10X5 each with 1 UE assist Postural Blue theraband scap retraction X10  Blue theraband shoulder extension X10  Pallof Blue theraband X10                                                                                                                                PATIENT EDUCATION:  Education details: Pt was educated on findings of PT evaluation, prognosis, frequency of therapy visits and rationale, attendance policy, and HEP if given.   Person educated: Patient Education method: Explanation, Verbal cues, and Handouts Education comprehension: verbalized understanding and needs further education  HOME EXERCISE PROGRAM: Access Code:  [email or web address] URL: [email or web address] Date: 08/29/2023 Prepared by: Lang Ada  Exercises - Supine Bridge  - 1 x daily - 7 x weekly - 3 sets - 10 reps - Supine Lower Trunk Rotation  - 1 x daily - 7 x weekly - 3 sets - 10 reps - Sit to Stand with Arms Crossed  - 1 x daily - 7 x weekly - 3 sets - 10 reps  ASSESSMENT:  CLINICAL IMPRESSION: Patient continues to demonstrate decreased core/LE strength, decreased gait quality and activity tolerance. Patient also demonstrates lack of meeting 4/6 goals with deficits still demonstrated in pain reported, endurance, and functional mobility. Patient given recommendation of low back pain literature and revised HEP, pt returned good demonstration. Patient to be discharged at this time for plateau of progress towards therapy goals and pts request.     Eval:Patient is a 58 y.o. male who was seen today for physical therapy evaluation and treatment for M54.50,G89.29 (ICD-10-CM) - Chronic bilateral low back pain without sciatica.   Patient demonstrates low back pain with functional movements, decreased LE strength, and abnormal gait pattern. Patient also demonstrates antalgic gait pattern with ambulation during today's session with decreased arm swing noted and tightness in low back reported at the end of . Patient also demonstrates decreased LE strength of adductors and hip extensors specifically. Patient also demonstrates most pain with flexion AROM of lumbar spine and tenderness to palpation and mobilization to all four lumbar spinal segments as pts imaging reveals he only has L1-L4. Patient would benefit from skilled physical therapy for decreased low  back pain, increased endurance with ambulation, increased LE/core strength, and improved gait quality for return to higher level of function with ADLs, and progress towards therapy goals.   OBJECTIVE IMPAIRMENTS: Abnormal gait, decreased activity tolerance, decreased balance, decreased endurance,  decreased ROM, decreased strength, and pain.   ACTIVITY LIMITATIONS: carrying, lifting, bending, standing, and squatting  PARTICIPATION LIMITATIONS: meal prep, cleaning, laundry, shopping, community activity, and yard work  PERSONAL FACTORS: Past/current experiences are also affecting patient's functional outcome.   REHAB POTENTIAL: Good  CLINICAL DECISION MAKING: Stable/uncomplicated  EVALUATION COMPLEXITY: Low   GOALS: Goals reviewed with patient? No  SHORT TERM GOALS: Target date: 09/12/23  Pt will be independent with HEP in order to demonstrate participation in Physical Therapy POC.  Baseline: 09/26/23:  Reports compliance with HEP daily. Goal status: MET  2.  Pt will report 4/10 pain at worst with mobility in order to demonstrate improved pain with ADLs.  Baseline: 6/10; 09/26/23:  Current pain scale 8/10, average 6/10. Goal status: NOT MET  LONG TERM GOALS: Target date: 09/26/23  Pt will improve 5TSTS by at least 2.3 seconds in order to demonstrate improved functional strength to return to desired activities.  Baseline: see objective. 09/26/23:  5 STS 20.48, improved by 1.67. 10/15/23: 17.57 Goal status: MET  2.  Pt will improve 2 MWT by 140 feet in order to demonstrate improved functional ambulatory capacity in community setting.  Baseline: see objective. ; 09/26/23: 442 ft, improved by 84ft Goal status: NOT MET  3.  Pt will improve Modified Oswestry score by at least 6 points in order to demonstrate improved pain with functional goals and outcomes. Baseline: see objective. : 10/15/23: 23/50 ; 09/26/23: 24 / 50 = 48.0, increased by 10 points since eval Goal status: NOT MET  4.  Pt will report 2/10 pain at worst with mobility in order to demonstrate reduced pain with ADLs lasting greater than 30 minutes.  Baseline: see objective. ; 09/26/23:  Current pain scale 8/10, average 6/10. Goal status: NOT MET   PLAN:  PT FREQUENCY: 2x/week  PT DURATION: 4  weeks  PLANNED INTERVENTIONS: 97110-Therapeutic exercises, 97530- Therapeutic activity, [id number]- Neuromuscular re-education, 97535- Self Care, 02859- Manual therapy, [phone]- Gait training, Patient/Family education, Spinal mobilization, Cryotherapy, and Moist heat.  PLAN FOR NEXT SESSION: discharged  Lang Ada, PT, DPT [facility] Office: [phone] 9:30 AM, 10/15/23

## 2023-10-20 ENCOUNTER — Other Ambulatory Visit: Payer: Self-pay | Admitting: Family Medicine

## 2023-10-22 ENCOUNTER — Telehealth: Payer: Self-pay

## 2023-10-22 NOTE — Telephone Encounter (Signed)
 Copied from CRM [id number]. Topic: Clinical - Request for Lab/Test Order >> Oct 22, 2023  2:04 PM Sophia H wrote: Reason for CRM: Patient is requesting orders for updated blood work - has an appointment with Dr. Duanne scheduled for 07/25. Please advise # [phone]

## 2023-11-07 ENCOUNTER — Ambulatory Visit: Admitting: Family Medicine

## 2023-11-07 ENCOUNTER — Other Ambulatory Visit: Payer: Self-pay | Admitting: Family Medicine

## 2023-11-07 ENCOUNTER — Encounter: Payer: Self-pay | Admitting: Family Medicine

## 2023-11-07 VITALS — BP 130/82 | HR 84 | Temp 98.0°F | Ht 73.0 in | Wt 245.6 lb

## 2023-11-07 DIAGNOSIS — M545 Low back pain, unspecified: Secondary | ICD-10-CM

## 2023-11-07 DIAGNOSIS — Z7984 Long term (current) use of oral hypoglycemic drugs: Secondary | ICD-10-CM

## 2023-11-07 DIAGNOSIS — E1169 Type 2 diabetes mellitus with other specified complication: Secondary | ICD-10-CM

## 2023-11-07 DIAGNOSIS — I10 Essential (primary) hypertension: Secondary | ICD-10-CM | POA: Diagnosis not present

## 2023-11-07 DIAGNOSIS — R252 Cramp and spasm: Secondary | ICD-10-CM

## 2023-11-07 DIAGNOSIS — E785 Hyperlipidemia, unspecified: Secondary | ICD-10-CM | POA: Diagnosis not present

## 2023-11-07 DIAGNOSIS — Z125 Encounter for screening for malignant neoplasm of prostate: Secondary | ICD-10-CM

## 2023-11-07 DIAGNOSIS — G8929 Other chronic pain: Secondary | ICD-10-CM

## 2023-11-07 MED ORDER — EMPAGLIFLOZIN 25 MG PO TABS: 25.0000 mg | ORAL_TABLET | Freq: Every day | ORAL | 3 refills | Status: AC

## 2023-11-07 MED ORDER — LOSARTAN POTASSIUM 100 MG PO TABS: 100.0000 mg | ORAL_TABLET | Freq: Every day | ORAL | 3 refills | Status: AC

## 2023-11-07 MED ORDER — AMLODIPINE BESYLATE 10 MG PO TABS: 10.0000 mg | ORAL_TABLET | Freq: Every day | ORAL | 3 refills | Status: AC

## 2023-11-07 MED ORDER — HYDROCODONE-ACETAMINOPHEN 7.5-325 MG PO TABS
1.0000 | ORAL_TABLET | Freq: Four times a day (QID) | ORAL | 0 refills | Status: DC | PRN
Start: 2023-11-07 — End: 2023-12-12

## 2023-11-07 NOTE — Progress Notes (Signed)
 Subjective:    Patient ID: Xavier Daniels, male    DOB: [birth year], 58 y.o.   MRN: [id number]  05/2023 Patient has been diagnosed with cervical radiculopathy.  Please see my previous office visits.  He has been dealing with pain radiating from his neck into his left shoulder and left arm for more than a year.  A CT scan of the cervical spine last year showed degenerative disc disease at C5-C6.  He has been treated with steroid taper packs in the past which have helped substantially.  When I last saw the patient in March, we decided not to pursue an MRI because his pain was relatively well-controlled.  However recently had to go to the emergency room again due to exacerbation of the pain.  He is seeing an orthopedist who has ordered an MRI of his neck on Saturday.  He is trying Lyrica  75 mg twice daily which has helped minimally  06/06/23 Patient continues to have occasional pain radiating from his neck into his shoulder.  He would like a refill on his pain medication that he would use sparingly.  His pain has improved dramatically however he still has pain on occasion.  We discussed this and I will be willing to give him 30 pills with the intention that the medication would last 3 to 6 months if not longer.  Patient states that this is reasonable and would appreciate that.  Over the last month he has developed pain in his lower back pain located around the left L4-L5.  He describes it as a dorsal bandlike pain.  There is no exacerbating or alleviating factors.  The pain will go and come without reason.  It is a dull aching pain.  He denies any lumbar radiculopathy.  He denies any dysuria or hematuria or urgency or frequency.  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.  At that time, my plan was: I agreed to give the patient 82 Norco with the intention that this medication would last 3 to 6 months.  He will take this sparingly and intermittently for his neck and cervical pain.  Obtain an x-ray of  the lumbar spine.  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 Lumbar alignment is normal. Vertebral body heights are maintained. Mild disc space narrowing at L3-L4 and L4-S1. Multilevel degenerative osteophytes. Moderate lower lumbar facet degenerative changes.  Patient continues to endorse daily pain in his lower back primarily to the right side at the level of L4.  He denies any sciatica.  He denies any saddle anesthesia or bowel or bladder incontinence.  It is more of an aching pain on a daily basis.  At that time, my plan was: I would recommend a trial of physical therapy.  He can also use meloxicam  15 mg daily.  X-ray suggest degenerative changes consistent with wear and tear and early arthritis.  Hopefully the meloxicam  and physical therapy will be beneficial  11/07/23 Patient has completed physical therapy.  He states that the massages seem to help.  He does still continue to have muscle spasms in his lower back.  He denies any sciatica.  He denies any bowel or bladder incontinence.  He denies any saddle anesthesia.  He denies any weakness in his legs.  His blood pressure is well-controlled today at 130/82.  He is overdue  for the shingles vaccine.  He is due for a pneumonia vaccine.  We discussed both of these today.  He politely declines.  He is due for a PSA to screen for prostate cancer.  He is also due to check his diabetes and his cholesterol.  He is not checking his blood sugar at home.  He denies any polyuria, polydipsia, or blurry vision.  He denies any chest pain or shortness of breath or dyspnea on exertion.  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, CLEAN FOR 13 YEARS   Past Surgical History:  Procedure Laterality Date   BIOPSY  04/20/2019   Procedure: BIOPSY;  Surgeon: Harvey Margo CROME, MD;  [facility];  Service: Endoscopy;;  transverse polyp x1   COLONOSCOPY WITH PROPOFOL  N/A 04/20/2019   Procedure: COLONOSCOPY WITH PROPOFOL ;  Surgeon: Harvey Margo CROME, MD;  [facility];  Service: Endoscopy;  Laterality: N/A;  9:30am-office rescheduled 04/20/19 @ 8:30am   POLYPECTOMY  04/20/2019   Procedure: POLYPECTOMY;  Surgeon: Harvey Margo CROME, MD;  [facility];  Service: Endoscopy;;  transversex1, rectal polyp   TOTAL HIP ARTHROPLASTY  2007   due to injury for avascular necrosis.   Current Outpatient Medications on File Prior to Visit  Medication Sig Dispense Refill   allopurinol  (ZYLOPRIM ) 300 MG tablet TAKE ONE (1) TABLET BY MOUTH EVERY DAY 90 tablet 0   amLODipine  (NORVASC ) 10 MG tablet TAKE ONE (1) TABLET BY MOUTH EVERY DAY THIS REPLACES ATENOLOL  90 tablet 3   HYDROcodone -acetaminophen  (NORCO) 7.5-325 MG tablet Take 1 tablet by mouth every 6 (six) hours as needed for moderate pain (pain score 4-6). 30 tablet 0   ibuprofen  (ADVIL ) 800 MG tablet TAKE ONE TABLET BY MOUTH EVERY EIGHT HOURS AS NEEDED 60 tablet 1   JARDIANCE  25 MG TABS tablet TAKE ONE (1) TABLET BY MOUTH EVERY DAY BEFORE BREAKFAST 90 tablet 3   levocetirizine (XYZAL  ALLERGY 24HR) 5 MG tablet Take 1 tablet (5 mg total) by mouth every evening. 30 tablet 5   lidocaine  (LIDODERM ) 5 % Place 1 patch onto the skin daily. Remove & Discard patch within 12 hours or as directed by MD 30 patch 0   losartan  (COZAAR ) 100 MG tablet TAKE ONE (1) TABLET BY MOUTH EVERY DAY 90 tablet 3   meloxicam  (MOBIC ) 15 MG tablet Take 1 tablet (15 mg total) by mouth daily. 30 tablet 3   ondansetron  (ZOFRAN -ODT) 4 MG disintegrating tablet Take 1 tablet (4 mg total) by mouth every 8 (eight) hours as needed for nausea or  vomiting. 20 tablet 0   OZEMPIC , 0.25 OR 0.5 MG/DOSE, 2 MG/3ML SOPN INJECT 0.5MG  INTO THE SKIN ONCE A WEEK 3 mL 1   pravastatin  (PRAVACHOL ) 40 MG tablet TAKE ONE (1) TABLET BY MOUTH EVERY DAY 90 tablet 1   RESTASIS 0.05 % ophthalmic emulsion Place 1 drop into both eyes 2 (two) times daily.     No current facility-administered medications on file prior to visit.   Allergies  Allergen Reactions   Metformin  And Related     Diarrhea    Social History   Socioeconomic History   Marital status: Married    Spouse name: Not  on file   Number of children: Not on file   Years of education: Not on file   Highest education level: GED or equivalent  Occupational History   Not on file  Tobacco Use   Smoking status: Never   Smokeless tobacco: Never  Vaping Use   Vaping status: Never Used  Substance and Sexual Activity   Alcohol use: No    Comment: previous heavy use, quit 2004   Drug use: No    Comment: previous marijuana use   Sexual activity: Yes    Birth control/protection: None  Other Topics Concern   Not on file  Social History Narrative   Not on file   Social Drivers of Health   Financial Resource Strain: Medium Risk (10/31/2023)   Overall Financial Resource Strain (CARDIA)    Difficulty of Paying Living Expenses: Somewhat hard  Food Insecurity: Food Insecurity Present (10/31/2023)   Hunger Vital Sign    Worried About Running Out of Food in the Last Year: Sometimes true    Ran Out of Food in the Last Year: Never true  Transportation Needs: No Transportation Needs (10/31/2023)   PRAPARE - Administrator, Civil Service (Medical): No    Lack of Transportation (Non-Medical): No  Physical Activity: Inactive (10/31/2023)   Exercise Vital Sign    Days of Exercise per Week: 0 days    Minutes of Exercise per Session: Not on file  Stress: No Stress Concern Present (10/31/2023)   [employer] of Occupational Health - Occupational Stress Questionnaire    Feeling of  Stress: Only a little  Social Connections: Socially Integrated (10/31/2023)   Social Connection and Isolation Panel    Frequency of Communication with Friends and Family: More than three times a week    Frequency of Social Gatherings with Friends and Family: Once a week    Attends Religious Services: 1 to 4 times per year    Active Member of [employer] or Organizations: Yes    Attends Banker Meetings: 1 to 4 times per year    Marital Status: Married  Catering manager Violence: Not on file      Review of Systems  Musculoskeletal:  Positive for back pain.  All other systems reviewed and are negative.      Objective:   Physical Exam Vitals reviewed.  Constitutional:      General: He is not in acute distress.    Appearance: Normal appearance. He is not ill-appearing or toxic-appearing.  Cardiovascular:     Rate and Rhythm: Normal rate and regular rhythm.     Pulses: Normal pulses.     Heart sounds: Normal heart sounds. No murmur heard.    No friction rub. No gallop.  Pulmonary:     Effort: Pulmonary effort is normal. No respiratory distress.     Breath sounds: Normal breath sounds. No stridor. No wheezing, rhonchi or rales.  Musculoskeletal:     Lumbar back: No spasms, tenderness or bony tenderness. Decreased range of motion.       Back:  Neurological:     Mental Status: He is alert.           Assessment & Plan:  Hyperlipidemia, unspecified hyperlipidemia type  Essential hypertension - Plan: amLODipine  (NORVASC ) 10 MG tablet, losartan  (COZAAR ) 100 MG tablet  Type 2 diabetes mellitus with other specified complication, without long-term current use of insulin (HCC) - Plan: empagliflozin  (JARDIANCE ) 25 MG TABS tablet, Hemoglobin A1c, CBC with Differential/Platelet, Comprehensive metabolic panel with  GFR, Lipid panel, Microalbumin/Creatinine Ratio, Urine  Prostate cancer screening - Plan: PSA  Chronic bilateral low back pain without sciatica - Plan:  HYDROcodone -acetaminophen  (NORCO) 7.5-325 MG tablet  Cramp of extremity - Plan: Magnesium I believe most of his low back pain is muscular.  He denies any symptoms to suggest nerve impingement or spinal stenosis.  I will refill his pain medication but encouraged him to take it sparingly.  He states that he is not taking it on a daily basis.  He uses it intermittently.  I will screen the patient for prostate cancer with a PSA.  I will also check a CBC CMP lipid panel and an A1c.  I would like to see his LDL cholesterol less than 899 and his hemoglobin A1c less than 6.5.  I will check a microalbumin to creatinine ratio.  Diabetic foot exam was normal.  Recommended the shingles vaccine and the pneumonia vaccine but the patient declined.

## 2023-11-08 LAB — COMPREHENSIVE METABOLIC PANEL WITH GFR
AG Ratio: 1.6 (calc) (ref 1.0–2.5)
ALT: 18 U/L (ref 9–46)
AST: 21 U/L (ref 10–35)
Albumin: 4.4 g/dL (ref 3.6–5.1)
Alkaline phosphatase (APISO): 82 U/L (ref 35–144)
BUN: 10 mg/dL (ref 7–25)
CO2: 21 mmol/L (ref 20–32)
Calcium: 9.5 mg/dL (ref 8.6–10.3)
Chloride: 107 mmol/L (ref 98–110)
Creat: 1.09 mg/dL (ref 0.70–1.30)
Globulin: 2.8 g/dL (ref 1.9–3.7)
Glucose, Bld: 113 mg/dL — ABNORMAL HIGH (ref 65–99)
Potassium: 4 mmol/L (ref 3.5–5.3)
Sodium: 138 mmol/L (ref 135–146)
Total Bilirubin: 0.5 mg/dL (ref 0.2–1.2)
Total Protein: 7.2 g/dL (ref 6.1–8.1)
eGFR: 79 mL/min/1.73m2 (ref >=60)

## 2023-11-08 LAB — CBC WITH DIFFERENTIAL/PLATELET
Absolute Lymphocytes: 2189 {cells}/uL (ref 850–3900)
Absolute Monocytes: 324 {cells}/uL (ref 200–950)
Basophils Absolute: 29 {cells}/uL (ref 0–200)
Basophils Relative: 0.7 %
Eosinophils Absolute: 111 {cells}/uL (ref 15–500)
Eosinophils Relative: 2.7 %
HCT: 44.3 % (ref 38.5–50.0)
Hemoglobin: 14.4 g/dL (ref 13.2–17.1)
MCH: 29.3 pg (ref 27.0–33.0)
MCHC: 32.5 g/dL (ref 32.0–36.0)
MCV: 90.2 fL (ref 80.0–100.0)
MPV: 10.8 fL (ref 7.5–12.5)
Monocytes Relative: 7.9 %
Neutro Abs: 1447 {cells}/uL — ABNORMAL LOW (ref 1500–7800)
Neutrophils Relative %: 35.3 %
Platelets: 212 Thousand/uL (ref 140–400)
RBC: 4.91 Million/uL (ref 4.20–5.80)
RDW: 14.3 % (ref 11.0–15.0)
Total Lymphocyte: 53.4 %
WBC: 4.1 Thousand/uL (ref 3.8–10.8)

## 2023-11-08 LAB — MICROALBUMIN / CREATININE URINE RATIO
Creatinine, Urine: 65 mg/dL (ref 20–320)
Microalb, Ur: <0.2 mg/dL

## 2023-11-08 LAB — LIPID PANEL
Cholesterol: 127 mg/dL (ref <200)
HDL: 44 mg/dL (ref >=40)
LDL Cholesterol (Calc): 64 mg/dL
Non-HDL Cholesterol (Calc): 83 mg/dL (ref <130)
Total CHOL/HDL Ratio: 2.9 (calc) (ref <5.0)
Triglycerides: 106 mg/dL (ref <150)

## 2023-11-08 LAB — HEMOGLOBIN A1C
Hgb A1c MFr Bld: 6.6 % — ABNORMAL HIGH (ref <5.7)
Mean Plasma Glucose: 143 mg/dL
eAG (mmol/L): 7.9 mmol/L

## 2023-11-08 LAB — MAGNESIUM: Magnesium: 2 mg/dL (ref 1.5–2.5)

## 2023-11-08 LAB — PSA: PSA: 1.22 ng/mL (ref <=4.00)

## 2023-11-10 ENCOUNTER — Ambulatory Visit: Payer: Self-pay | Admitting: Family Medicine

## 2023-11-28 ENCOUNTER — Other Ambulatory Visit: Payer: Self-pay

## 2023-11-28 ENCOUNTER — Encounter: Payer: Self-pay | Admitting: Emergency Medicine

## 2023-11-28 ENCOUNTER — Ambulatory Visit
Admission: EM | Admit: 2023-11-28 | Discharge: 2023-11-28 | Disposition: A | Discharge status: Home / Self Care | Attending: Nurse Practitioner | Admitting: Nurse Practitioner

## 2023-11-28 DIAGNOSIS — S46912A Strain of unspecified muscle, fascia and tendon at shoulder and upper arm level, left arm, initial encounter: Secondary | ICD-10-CM | POA: Diagnosis not present

## 2023-11-28 MED ORDER — MELOXICAM 7.5 MG PO TABS: 7.5000 mg | ORAL_TABLET | Freq: Every day | ORAL | 0 refills | Status: AC

## 2023-11-28 NOTE — ED Triage Notes (Signed)
 Pt reports left shoulder pain since slipping at home on Monday. Pt denies hitting head or loc but reports left shoulder pain with movement ever since. No obvious deformity.

## 2023-11-28 NOTE — ED Provider Notes (Signed)
 [facility]    CSN: [id number] Arrival date & time: 11/28/23  0843      History   Chief Complaint Chief Complaint  Patient presents with   Shoulder Pain    HPI Xavier Daniels is a 58 y.o. male.   The history is provided by the patient.   Patient presents with a 3 to 4-day history of left shoulder pain.  Patient states that he tripped over something in the floor and stretched out his left arm to catch himself.  He states that the arm was in a fixed position.  He states he did not experience pain immediately after he fell, but felt the pain shortly thereafter.  He states there is 1 area in the left shoulder that is tender.  He denies bruising, swelling, decreased range of motion, or radiation of pain.  Further denies numbness or tingling.  So far he has not tried any interventions for his symptoms.  Past Medical History:  Diagnosis Date   Arthritis    Colon cancer screening 10/20/2018   Diabetes mellitus without complication (HCC)    Gout    Gout    Hypertension    Substance abuse (HCC)    ETOH, 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, CLEAN FOR 13 YEARS    Patient Active Problem List   Diagnosis Date Noted   Chronic pain of left knee 12/14/2019   Depression, major, single episode, mild (HCC) 12/14/2019   Left ankle pain 12/14/2019   T2DM (type 2 diabetes mellitus) (HCC) 09/10/2019   Essential hypertension 06/06/2014   Hyperlipidemia 06/06/2014   Gout 06/06/2014   Obesity 06/06/2014    Past Surgical History:  Procedure Laterality Date   BIOPSY  04/20/2019   Procedure: BIOPSY;  Surgeon: Harvey Margo CROME, MD;  [facility];  Service: Endoscopy;;  transverse polyp x1   COLONOSCOPY WITH PROPOFOL  N/A 04/20/2019   Procedure: COLONOSCOPY WITH PROPOFOL ;  Surgeon: Harvey Margo CROME, MD;  [facility]: [facility];  Service: Endoscopy;  Laterality: N/A;  9:30am-office rescheduled 04/20/19 @ 8:30am   POLYPECTOMY  04/20/2019   Procedure: POLYPECTOMY;  Surgeon: Harvey Margo CROME, MD;   [facility];  Service: Endoscopy;;  transversex1, rectal polyp   TOTAL HIP ARTHROPLASTY  2007   due to injury for avascular necrosis.       Home Medications    Prior to Admission medications   Medication Sig Start Date End Date Taking? Authorizing Provider  allopurinol  (ZYLOPRIM ) 300 MG tablet TAKE ONE (1) TABLET BY MOUTH EVERY DAY 10/20/23   Duanne Butler DASEN, MD  amLODipine  (NORVASC ) 10 MG tablet Take 1 tablet (10 mg total) by mouth daily. 11/07/23   Duanne Butler DASEN, MD  empagliflozin  (JARDIANCE ) 25 MG TABS tablet Take 1 tablet (25 mg total) by mouth daily. 11/07/23   Duanne Butler DASEN, MD  HYDROcodone -acetaminophen  (NORCO) 7.5-325 MG tablet Take 1 tablet by mouth every 6 (six) hours as needed for moderate pain (pain score 4-6). 11/07/23   Duanne Butler DASEN, MD  ibuprofen  (ADVIL ) 800 MG tablet TAKE ONE TABLET BY MOUTH EVERY EIGHT HOURS AS NEEDED 07/03/22   Duanne Butler DASEN, MD  levocetirizine (XYZAL  ALLERGY 24HR) 5 MG tablet Take 1 tablet (5 mg total) by mouth every evening. 09/03/21   Duanne Butler DASEN, MD  lidocaine  (LIDODERM ) 5 % Place 1 patch onto the skin daily. Remove & Discard patch within 12 hours or as directed by MD 09/19/22   Haze Lonni PARAS, MD  losartan  (COZAAR ) 100 MG  tablet Take 1 tablet (100 mg total) by mouth daily. 11/07/23   Duanne Butler DASEN, MD  meloxicam  (MOBIC ) 7.5 MG tablet Take 1 tablet (7.5 mg total) by mouth daily. 11/28/23  Yes Leath-Warren, Etta PARAS, NP  ondansetron  (ZOFRAN -ODT) 4 MG disintegrating tablet Take 1 tablet (4 mg total) by mouth every 8 (eight) hours as needed for nausea or vomiting. 07/25/23   Towana Ozell BROCKS, MD  OZEMPIC , 0.25 OR 0.5 MG/DOSE, 2 MG/3ML SOPN INJECT 0.5MG  INTO THE SKIN ONCE A WEEK 11/10/23   Duanne Butler DASEN, MD  pravastatin  (PRAVACHOL ) 40 MG tablet TAKE ONE (1) TABLET BY MOUTH EVERY DAY 09/15/23   Duanne Butler DASEN, MD  RESTASIS 0.05 % ophthalmic emulsion Place 1 drop into both eyes 2 (two) times daily. 05/08/23    [provider]    Family History Family History  Problem Relation Age of Onset   Diabetes Mother    Diabetes Brother     Social History Social History   Tobacco Use   Smoking status: Never   Smokeless tobacco: Never  Vaping Use   Vaping status: Never Used  Substance Use Topics   Alcohol use: No    Comment: previous heavy use, quit 2004   Drug use: No    Comment: previous marijuana use     Allergies   Metformin  and related   Review of Systems Review of Systems Per HPI  Physical Exam Triage Vital Signs ED Triage Vitals  Encounter Vitals Group     BP 11/28/23 0923 116/78     Girls Systolic BP Percentile --      Girls Diastolic BP Percentile --      Boys Systolic BP Percentile --      Boys Diastolic BP Percentile --      Pulse Rate 11/28/23 0923 92     Resp 11/28/23 0923 20     Temp 11/28/23 0923 98 F (36.7 C)     Temp Source 11/28/23 0923 Oral     SpO2 11/28/23 0923 95 %     Weight --      Height --      Head Circumference --      Peak Flow --      Pain Score 11/28/23 0922 6     Pain Loc --      Pain Education --      Exclude from Growth Chart --    No data found.  Updated Vital Signs BP 116/78 (BP Location: Right Arm)   Pulse 92   Temp 98 F (36.7 C) (Oral)   Resp 20   SpO2 95%   Visual Acuity Right Eye Distance:   Left Eye Distance:   Bilateral Distance:    Right Eye Near:   Left Eye Near:    Bilateral Near:     Physical Exam Vitals and nursing note reviewed.  Constitutional:      General: He is not in acute distress.    Appearance: Normal appearance.  HENT:     Head: Normocephalic.  Eyes:     Extraocular Movements: Extraocular movements intact.     Pupils: Pupils are equal, round, and reactive to light.  Pulmonary:     Effort: Pulmonary effort is normal.  Musculoskeletal:     Left shoulder: Tenderness present. No swelling or deformity. Normal range of motion. Normal strength. Normal pulse.       Arms:     Cervical  back: Normal range of motion.  Skin:  General: Skin is warm and dry.  Neurological:     General: No focal deficit present.     Mental Status: He is alert and oriented to person, place, and time.  Psychiatric:        Mood and Affect: Mood normal.        Behavior: Behavior normal.      UC Treatments / Results  Labs (all labs ordered are listed, but only abnormal results are displayed) Labs Reviewed - No data to display  EKG   Radiology No results found.  Procedures Procedures (including critical care time)  Medications Ordered in UC Medications - No data to display  Initial Impression / Assessment and Plan / UC Course  I have reviewed the triage vital signs and the nursing notes.  Pertinent labs & imaging results that were available during my care of the patient were reviewed by me and considered in my medical decision making (see chart for details).  Patient with left shoulder pain after he used the left arm to break a fall.  Will defer imaging as patient has not experienced any direct trauma.  Will treat for possible pulled or strained muscle in the left shoulder with meloxicam  7.5 mg.  Supportive care recommendations were provided and discussed with the patient to include over-the-counter Tylenol , RICE therapy, and gentle range of motion exercises.  Patient was given indications regarding follow-up.  Patient was in agreement with this plan of care and verbalizes understanding.  All questions were answered.  Patient stable for discharge.  Final Clinical Impressions(s) / UC Diagnoses   Final diagnoses:  Left shoulder strain, initial encounter     Discharge Instructions      Take medication as prescribed. You may take over-the-counter Tylenol  as needed for pain or discomfort. RICE therapy, rest, ice, compression, and elevation.  Apply ice for 20 minutes, remove for 1 hour, repeat as needed while symptoms persist.  You may also use heat in the same manner. Gentle  range of motion exercises at least 2-3 times daily while symptoms persist.  I provided exercises for you to perform. As discussed, if symptoms fail to improve over the 2 to 3 weeks, or appear to be worsening, recommend follow-up with orthopedics for further evaluation. Follow-up as needed.     ED Prescriptions     Medication Sig Dispense Auth. Provider   meloxicam  (MOBIC ) 7.5 MG tablet Take 1 tablet (7.5 mg total) by mouth daily. 30 tablet Leath-Warren, Etta PARAS, NP      PDMP not reviewed this encounter.   [place] Etta PARAS, NP 11/28/23 [phone]

## 2023-11-28 NOTE — Discharge Instructions (Addendum)
 Take medication as prescribed. You may take over-the-counter Tylenol  as needed for pain or discomfort. RICE therapy, rest, ice, compression, and elevation.  Apply ice for 20 minutes, remove for 1 hour, repeat as needed while symptoms persist.  You may also use heat in the same manner. Gentle range of motion exercises at least 2-3 times daily while symptoms persist.  I provided exercises for you to perform. As discussed, if symptoms fail to improve over the 2 to 3 weeks, or appear to be worsening, recommend follow-up with orthopedics for further evaluation. Follow-up as needed.

## 2023-12-12 ENCOUNTER — Other Ambulatory Visit: Payer: Self-pay | Admitting: Family Medicine

## 2023-12-12 DIAGNOSIS — G8929 Other chronic pain: Secondary | ICD-10-CM

## 2023-12-24 ENCOUNTER — Other Ambulatory Visit: Payer: Self-pay | Admitting: Family Medicine

## 2024-01-09 ENCOUNTER — Other Ambulatory Visit: Payer: Self-pay | Admitting: Family Medicine

## 2024-01-09 DIAGNOSIS — G8929 Other chronic pain: Secondary | ICD-10-CM

## 2024-01-12 NOTE — Telephone Encounter (Signed)
 Requested medication (s) are due for refill today: yes  Requested medication (s) are on the active medication list: yes  Last refill:  12/12/23  Future visit scheduled: no  Notes to clinic:  Unable to refill per protocol, cannot delegate.      Requested Prescriptions  Pending Prescriptions Disp Refills   HYDROcodone -acetaminophen  (NORCO) 7.5-325 MG tablet [Pharmacy Med Name: HYDROCODONE -APAP 7.5-325 MG TAB] 30 tablet     Sig: TAKE ONE TABLET BY MOUTH EVERY SIX HOURSAS NEEDED FOR MODERATE PAIN (PAIN SCORE 4 TO 6)     Not Delegated - Analgesics:  Opioid Agonist Combinations Failed - 01/12/2024  9:45 AM      Failed - This refill cannot be delegated      Failed - Urine Drug Screen completed in last 360 days      Passed - Valid encounter within last 3 months    Recent Outpatient Visits           2 months ago Hyperlipidemia, unspecified hyperlipidemia type   [place] [facility] Duanne Butler DASEN, MD   5 months ago Chronic bilateral low back pain without sciatica   [place] [facility] Duanne Butler DASEN, MD   7 months ago Acute midline low back pain without sciatica   [place] [facility] Duanne Butler DASEN, MD   11 months ago Type 2 diabetes mellitus with other specified complication, without long-term current use of insulin [facility])   [place] [facility] Duanne Butler DASEN, MD   12 months ago Type 2 diabetes mellitus with other specified complication, without long-term current use of insulin [facility])   [place] [facility] [place], Butler DASEN, MD

## 2024-01-13 ENCOUNTER — Other Ambulatory Visit: Payer: Self-pay | Admitting: Family Medicine

## 2024-02-05 ENCOUNTER — Telehealth: Payer: Self-pay | Admitting: Pharmacy Technician

## 2024-02-05 ENCOUNTER — Other Ambulatory Visit (HOSPITAL_COMMUNITY): Payer: Self-pay

## 2024-02-05 NOTE — Telephone Encounter (Signed)
 Pharmacy Patient Advocate Encounter   Received notification from Onbase that prior authorization for Ozempic  (0.25 or 0.5 MG/DOSE) 2MG /3ML pen-injectors is due for renewal.   Insurance verification completed.   The patient is insured through [employer].  Action: Medication is now available without a prior authorization.

## 2024-02-09 ENCOUNTER — Other Ambulatory Visit: Payer: Self-pay | Admitting: Family Medicine

## 2024-02-09 DIAGNOSIS — G8929 Other chronic pain: Secondary | ICD-10-CM

## 2024-02-16 ENCOUNTER — Encounter: Payer: Self-pay | Admitting: Radiology

## 2024-02-23 ENCOUNTER — Ambulatory Visit: Admitting: Family Medicine

## 2024-02-24 ENCOUNTER — Ambulatory Visit: Admitting: Family Medicine

## 2024-02-24 ENCOUNTER — Encounter: Payer: Self-pay | Admitting: Family Medicine

## 2024-02-24 VITALS — BP 118/72 | HR 100 | Temp 98.2°F | Ht 73.0 in | Wt 248.0 lb

## 2024-02-24 DIAGNOSIS — E1169 Type 2 diabetes mellitus with other specified complication: Secondary | ICD-10-CM | POA: Diagnosis not present

## 2024-02-24 DIAGNOSIS — M25511 Pain in right shoulder: Secondary | ICD-10-CM | POA: Diagnosis not present

## 2024-02-24 DIAGNOSIS — Z7984 Long term (current) use of oral hypoglycemic drugs: Secondary | ICD-10-CM | POA: Diagnosis not present

## 2024-02-24 NOTE — Progress Notes (Signed)
 Subjective:    Patient ID: Xavier Daniels, male    DOB: [birth year], 58 y.o.   MRN: [id number]  Shoulder Pain   Patient complains of bilateral shoulder pain.  He states has been an insidious onset.  He denies any injury.  He describes an aching pain centered right in the glenohumeral joint of both shoulders.  He has no pain with abduction greater than 90 degrees.  He has a negative empty can sign.  He has a negative Hawking sign.  There is no crepitus with passive range of motion of the shoulder.  He has full range of motion without pain today.  He states that the pain is better.  He has normal reflexes in both arms.  He has normal grip strength.  He has a history of cervical radiculopathy but he denies any radiating pain coming from his neck or numbness or tingling in his arms. Past Medical History:  Diagnosis Date   Arthritis    Colon cancer screening 10/20/2018   Diabetes mellitus without complication (HCC)    Gout    Gout    Hypertension    Substance abuse (HCC)    ETOH, 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, CLEAN FOR 13 YEARS   Past Surgical History:  Procedure Laterality Date   BIOPSY  04/20/2019   Procedure: BIOPSY;  Surgeon: Harvey Margo CROME, MD;  [facility];  Service: Endoscopy;;  transverse polyp x1   COLONOSCOPY WITH PROPOFOL  N/A 04/20/2019   Procedure: COLONOSCOPY WITH PROPOFOL ;  Surgeon: Harvey Margo CROME, MD;  [facility];  Service: Endoscopy;  Laterality: N/A;  9:30am-office rescheduled 04/20/19 @ 8:30am   POLYPECTOMY  04/20/2019   Procedure: POLYPECTOMY;  Surgeon: Harvey Margo CROME, MD;  [facility];  Service: Endoscopy;;  transversex1, rectal polyp   TOTAL HIP ARTHROPLASTY  2007   due to injury for avascular necrosis.   Current Outpatient Medications on File Prior to Visit  Medication Sig Dispense Refill   allopurinol  (ZYLOPRIM ) 300 MG tablet TAKE ONE (1) TABLET BY MOUTH EVERY DAY 90 tablet 1   amLODipine  (NORVASC ) 10 MG tablet Take 1 tablet (10 mg total) by  mouth daily. 90 tablet 3   empagliflozin  (JARDIANCE ) 25 MG TABS tablet Take 1 tablet (25 mg total) by mouth daily. 90 tablet 3   HYDROcodone -acetaminophen  (NORCO) 7.5-325 MG tablet Take 1 tablet by mouth every 6 (six) hours as needed for moderate pain (pain score 4-6). 30 tablet 0   ibuprofen  (ADVIL ) 800 MG tablet TAKE ONE TABLET BY MOUTH EVERY EIGHT HOURS AS NEEDED 60 tablet 1   levocetirizine (XYZAL  ALLERGY 24HR) 5 MG tablet Take 1 tablet (5 mg total) by mouth every evening. 30 tablet 5   lidocaine  (LIDODERM ) 5 % Place 1 patch onto the skin daily. Remove & Discard patch within 12 hours or as directed by MD 30 patch 0   losartan  (COZAAR ) 100 MG tablet Take 1 tablet (100 mg total) by mouth daily. 90 tablet 3   meloxicam  (MOBIC ) 7.5 MG tablet Take 1 tablet (7.5 mg total) by mouth daily. 30 tablet 0   ondansetron  (ZOFRAN -ODT) 4 MG disintegrating tablet Take 1 tablet (4 mg total) by mouth every 8 (eight) hours as needed for nausea or vomiting. 20 tablet 0   OZEMPIC , 0.25 OR 0.5 MG/DOSE, 2 MG/3ML SOPN INJECT 0.5MG  INTO THE SKIN ONCE A WEEK 3 mL 1   pravastatin  (PRAVACHOL ) 40 MG tablet TAKE ONE (1) TABLET BY MOUTH EVERY DAY 90 tablet 1  RESTASIS 0.05 % ophthalmic emulsion Place 1 drop into both eyes 2 (two) times daily.     No current facility-administered medications on file prior to visit.   Allergies  Allergen Reactions   Metformin  And Related     Diarrhea    Social History   Socioeconomic History   Marital status: Married    Spouse name: Not on file   Number of children: Not on file   Years of education: Not on file   Highest education level: 12th grade  Occupational History   Not on file  Tobacco Use   Smoking status: Never   Smokeless tobacco: Never  Vaping Use   Vaping status: Never Used  Substance and Sexual Activity   Alcohol use: No    Comment: previous heavy use, quit 2004   Drug use: No    Comment: previous marijuana use   Sexual activity: Yes    Birth  control/protection: None  Other Topics Concern   Not on file  Social History Narrative   Not on file   Social Drivers of Health   Financial Resource Strain: High Risk (02/18/2024)   Overall Financial Resource Strain (CARDIA)    Difficulty of Paying Living Expenses: Hard  Food Insecurity: Food Insecurity Present (02/18/2024)   Hunger Vital Sign    Worried About Running Out of Food in the Last Year: Sometimes true    Ran Out of Food in the Last Year: Sometimes true  Transportation Needs: No Transportation Needs (02/18/2024)   PRAPARE - Administrator, Civil Service (Medical): No    Lack of Transportation (Non-Medical): No  Physical Activity: Inactive (02/18/2024)   Exercise Vital Sign    Days of Exercise per Week: 0 days    Minutes of Exercise per Session: Not on file  Stress: No Stress Concern Present (02/18/2024)   [employer] of Occupational Health - Occupational Stress Questionnaire    Feeling of Stress: Only a little  Social Connections: Socially Integrated (02/18/2024)   Social Connection and Isolation Panel    Frequency of Communication with Friends and Family: Twice a week    Frequency of Social Gatherings with Friends and Family: Once a week    Attends Religious Services: 1 to 4 times per year    Active Member of [employer] or Organizations: Yes    Attends Banker Meetings: 1 to 4 times per year    Marital Status: Married  Catering Manager Violence: Not on file      Review of Systems  All other systems reviewed and are negative.      Objective:   Physical Exam Vitals reviewed.  Constitutional:      General: He is not in acute distress.    Appearance: Normal appearance. He is not ill-appearing or toxic-appearing.  HENT:     Mouth/Throat:     Pharynx: No oropharyngeal exudate or posterior oropharyngeal erythema.  Cardiovascular:     Rate and Rhythm: Normal rate and regular rhythm.     Pulses: Normal pulses.     Heart sounds: Normal heart  sounds. No murmur heard.    No friction rub. No gallop.  Pulmonary:     Effort: Pulmonary effort is normal. No respiratory distress.     Breath sounds: Normal breath sounds. No stridor. No wheezing, rhonchi or rales.  Musculoskeletal:     Right shoulder: No tenderness, bony tenderness or crepitus. Normal range of motion. Normal strength.     Left shoulder: No tenderness, bony tenderness  or crepitus. Normal range of motion. Normal strength.     Right lower leg: No edema.     Left lower leg: No edema.  Lymphadenopathy:     Cervical: No cervical adenopathy.  Neurological:     Mental Status: He is alert.           Assessment & Plan:  Type 2 diabetes mellitus with other specified complication, without long-term current use of insulin (HCC) - Plan: Hemoglobin A1c, Comprehensive metabolic panel with GFR, Lipid panel, Microalbumin / creatinine urine ratio  Acute pain of right shoulder - Plan: DG Shoulder Right Given his history, I was concerned about possible cervical radiculopathy causing the shoulder pain however his history and exam does not support that.  There is no evidence of bursitis or rotator cuff tear either.  I suspect that this is muscular pain.  I see no evidence of severe osteoarthritis.  X-ray of the left shoulder in 2023 was unremarkable.  I will order an x-ray of the right shoulder to evaluate further.  Recommended a trial of physical therapy but the patient states he would like to do the exercises on his own at home because the pain is not that bad.  I did recommend he return fasting for lab work to monitor his diabetes.

## 2024-02-26 ENCOUNTER — Other Ambulatory Visit: Payer: Self-pay | Admitting: Family Medicine

## 2024-03-01 ENCOUNTER — Other Ambulatory Visit

## 2024-03-01 DIAGNOSIS — E66811 Obesity, class 1: Secondary | ICD-10-CM

## 2024-03-01 DIAGNOSIS — I10 Essential (primary) hypertension: Secondary | ICD-10-CM

## 2024-03-01 DIAGNOSIS — E1169 Type 2 diabetes mellitus with other specified complication: Secondary | ICD-10-CM

## 2024-03-01 DIAGNOSIS — E785 Hyperlipidemia, unspecified: Secondary | ICD-10-CM

## 2024-03-02 ENCOUNTER — Ambulatory Visit: Payer: Self-pay | Admitting: Family Medicine

## 2024-03-02 LAB — COMPREHENSIVE METABOLIC PANEL WITH GFR
AG Ratio: 1.6 (calc) (ref 1.0–2.5)
ALT: 23 U/L (ref 9–46)
AST: 20 U/L (ref 10–35)
Albumin: 4.6 g/dL (ref 3.6–5.1)
Alkaline phosphatase (APISO): 74 U/L (ref 35–144)
BUN: 10 mg/dL (ref 7–25)
CO2: 24 mmol/L (ref 20–32)
Calcium: 9.7 mg/dL (ref 8.6–10.3)
Chloride: 105 mmol/L (ref 98–110)
Creat: 1.27 mg/dL (ref 0.70–1.30)
Globulin: 2.8 g/dL (ref 1.9–3.7)
Glucose, Bld: 120 mg/dL — ABNORMAL HIGH (ref 65–99)
Potassium: 3.9 mmol/L (ref 3.5–5.3)
Sodium: 140 mmol/L (ref 135–146)
Total Bilirubin: 0.6 mg/dL (ref 0.2–1.2)
Total Protein: 7.4 g/dL (ref 6.1–8.1)
eGFR: 65 mL/min/1.73m2 (ref >=60)

## 2024-03-02 LAB — MICROALBUMIN / CREATININE URINE RATIO
Creatinine, Urine: 103 mg/dL (ref 20–320)
Microalb Creat Ratio: 3 mg/g{creat} (ref <30)
Microalb, Ur: 0.3 mg/dL

## 2024-03-02 LAB — HEMOGLOBIN A1C
Hgb A1c MFr Bld: 6.6 % — ABNORMAL HIGH (ref <5.7)
Mean Plasma Glucose: 143 mg/dL
eAG (mmol/L): 7.9 mmol/L

## 2024-03-02 LAB — LIPID PANEL
Cholesterol: 145 mg/dL (ref <200)
HDL: 40 mg/dL (ref >=40)
LDL Cholesterol (Calc): 78 mg/dL
Non-HDL Cholesterol (Calc): 105 mg/dL (ref <130)
Total CHOL/HDL Ratio: 3.6 (calc) (ref <5.0)
Triglycerides: 170 mg/dL — ABNORMAL HIGH (ref <150)

## 2024-03-09 ENCOUNTER — Other Ambulatory Visit: Payer: Self-pay | Admitting: Family Medicine

## 2024-03-09 DIAGNOSIS — G8929 Other chronic pain: Secondary | ICD-10-CM

## 2024-03-26 ENCOUNTER — Other Ambulatory Visit: Payer: Self-pay | Admitting: Family Medicine

## 2024-03-26 DIAGNOSIS — E785 Hyperlipidemia, unspecified: Secondary | ICD-10-CM

## 2024-04-10 ENCOUNTER — Other Ambulatory Visit: Payer: Self-pay | Admitting: Family Medicine

## 2024-04-10 DIAGNOSIS — M545 Low back pain, unspecified: Secondary | ICD-10-CM

## 2024-05-12 ENCOUNTER — Other Ambulatory Visit: Payer: Self-pay | Admitting: Family Medicine

## 2024-05-12 DIAGNOSIS — G8929 Other chronic pain: Secondary | ICD-10-CM

## 2024-05-21 ENCOUNTER — Encounter (INDEPENDENT_AMBULATORY_CARE_PROVIDER_SITE_OTHER): Payer: Self-pay | Admitting: *Deleted
